# Patient Record
Sex: Female | Born: 1955 | Race: White | Hispanic: No | Marital: Married | Smoking: Current every day smoker
Health system: Southern US, Community
[De-identification: ages and names within clinical notes are randomized; demographics above are authoritative.]

## PROBLEM LIST (undated history)

## (undated) DIAGNOSIS — I10 Essential (primary) hypertension: Secondary | ICD-10-CM

## (undated) DIAGNOSIS — E7800 Pure hypercholesterolemia, unspecified: Secondary | ICD-10-CM

## (undated) DIAGNOSIS — Z720 Tobacco use: Secondary | ICD-10-CM

## (undated) DIAGNOSIS — Z87442 Personal history of urinary calculi: Secondary | ICD-10-CM

## (undated) DIAGNOSIS — F419 Anxiety disorder, unspecified: Secondary | ICD-10-CM

## (undated) HISTORY — DX: Essential (primary) hypertension: I10

## (undated) HISTORY — DX: Pure hypercholesterolemia, unspecified: E78.00

## (undated) HISTORY — DX: Anxiety disorder, unspecified: F41.9

---

## 1978-09-10 HISTORY — PX: TONSILLECTOMY AND ADENOIDECTOMY: SUR1326

## 1998-08-25 ENCOUNTER — Other Ambulatory Visit: Admission: RE | Admit: 1998-08-25 | Discharge: 1998-08-25 | Payer: Self-pay | Admitting: Obstetrics and Gynecology

## 2001-06-09 ENCOUNTER — Emergency Department (HOSPITAL_COMMUNITY): Admission: EM | Admit: 2001-06-09 | Discharge: 2001-06-09 | Payer: Self-pay | Admitting: Emergency Medicine

## 2002-08-13 ENCOUNTER — Other Ambulatory Visit: Admission: RE | Admit: 2002-08-13 | Discharge: 2002-08-13 | Payer: Self-pay | Admitting: Obstetrics and Gynecology

## 2003-11-30 ENCOUNTER — Other Ambulatory Visit: Admission: RE | Admit: 2003-11-30 | Discharge: 2003-11-30 | Payer: Self-pay | Admitting: Obstetrics and Gynecology

## 2009-11-23 ENCOUNTER — Ambulatory Visit: Payer: Self-pay | Admitting: Internal Medicine

## 2009-12-15 ENCOUNTER — Ambulatory Visit: Payer: Self-pay | Admitting: General Practice

## 2009-12-21 ENCOUNTER — Ambulatory Visit: Payer: Self-pay | Admitting: Internal Medicine

## 2010-10-10 NOTE — Miscellaneous (Signed)
Summary: Orders Update pft charges  Clinical Lists Changes  Orders: Added new Service order of Carbon Monoxide diffusing w/capacity (94720) - Signed Added new Service order of Lung Volumes (94240) - Signed Added new Service order of Spirometry (Pre & Post) (94060) - Signed 

## 2010-10-10 NOTE — Assessment & Plan Note (Signed)
Summary: Pulmonary/ ? copd vs all ace effects?   Visit Type:  Initial Consult Copy to:  [facility] Primary Provider/Referring Provider:  Dr. Burnett Sheng   CC:  COPD.  History of Present Illness: 55 yowf started smoking at age 55 on ace with chronic cough on intial pulmonary eval November 23, 2009   November 23, 2009 cc cough in am's chronically x sev years with change in  pattern > now coughing so hard takes breath away for several weeks rx with augmentin then avelox with mucus turned yellow to white but still coughing and requiing tussionex with tickle in throat always present but not disturbing sleep or excacerbating in ams.  minimal doe.  Pt denies any significant sore throat, dysphagia, itching, sneezing,  nasal congestion or excess secretions,  fever, chills, sweats, unintended wt loss, pleuritic or exertional cp, hempoptysis, change in activity tolerance  orthopnea pnd or leg swelling Pt also denies any obvious fluctuation in symptoms with weather or environmental change or other alleviating or aggravating factors.        Current Medications (verified): 1)  Celexa 20 Mg Tabs (Citalopram Hydrobromide) .Marland Kitchen.. 1 Once Daily 2)  Lisinopril-Hydrochlorothiazide 20-12.5 Mg Tabs (Lisinopril-Hydrochlorothiazide) .Marland Kitchen.. 1 Once Daily 3)  Tussionex Pennkinetic Er 8-10 Mg/56ml Lqcr (Chlorpheniramine-Hydrocodone) .Marland Kitchen.. 1 Tsp Every 12 Hours As Needed  Allergies (verified): 1)  ! Erythromycin 2)  ! Codeine  Past History:  Past Medical History: Hypertension    - ACE d/c November 23, 2009 for cough  Past Surgical History: Tonsillectomy at age 55  Family History: Emphysema- Mother (was a smoker)  Social History: Married with Agricultural consultant Current smoker since age 4.  Smokes 1 ppd. No ETOH  Review of Systems       The patient complains of productive cough, anxiety, and depression.  The patient denies shortness of breath with activity, shortness of breath at rest, non-productive cough,  coughing up blood, chest pain, irregular heartbeats, acid heartburn, indigestion, loss of appetite, weight change, abdominal pain, difficulty swallowing, sore throat, tooth/dental problems, headaches, nasal congestion/difficulty breathing through nose, sneezing, itching, ear ache, hand/feet swelling, joint stiffness or pain, rash, change in color of mucus, and fever.    Vital Signs:  Patient profile:   55 year old female Height:      62 inches Weight:      124 pounds BMI:     22.76 O2 Sat:      97 % on Room air Temp:     98.2 degrees F oral Pulse rate:   82 / minute BP sitting:   138 / 80  (left arm)  Vitals Entered By: Vernie Murders (November 23, 2009 11:34 AM)  O2 Flow:  Room air  Physical Exam  Additional Exam:  hoarse anxious wf nad HEENT: nl dentition, turbinates, and orophanx. Nl external ear canals without cough reflex NECK :  without JVD/Nodes/TM/ nl carotid upstrokes bilaterally LUNGS: no acc muscle use, clear to A and P bilaterally without cough on insp or exp maneuvers CV:  RRR  no s3 or murmur or increase in P2, no edema   ABD:  soft and nontender with nl excursion in the supine position. No bruits or organomegaly, bowel sounds nl MS:  warm without deformities, calf tenderness, cyanosis or clubbing SKIN: warm and dry without lesions   NEURO:  alert, approp, no deficits     CXR  Procedure date:  11/23/2009  Findings:      CHEST - 2 VIEW 11/23/2009:   Comparison: None.  Findings: Cardiomediastinal silhouette unremarkable.  Mildly prominent bronchovascular markings diffusely and moderate central peribronchial thickening.  Lungs otherwise clear.  No pleural effusions.  Early minimal degenerative changes in the thoracic spine.   IMPRESSION: Mild changes of bronchitis and/or asthma, either acute or chronic, without localized airspace pneumonia.    Impression & Recommendations:  Problem # 1:  COUGH (ICD-786.2) The most common causes of chronic cough in  immunocompetent adults include: upper airway cough syndrome (UACS), previously referred to as postnasal drip syndrome,  caused by variety of rhinosinus conditions; (2) asthma; (3) GERD; (4) chronic bronchitis from cigarette smoking or other inhaled environmental irritants; (5) nonasthmatic eosinophilic bronchitis; and (6) bronchiectasis. These conditions, singly or in combination, have accounted for up to 94% of the causes of chronic cough in prospective studies.  Upper airway cough syndrome, so named because it's frequently impossible to sort out how much is LPR vs CR/sinusitis with freq throat clearing generating secondary extra esophageal GERD from wide swings in gastric pressure that occur with throat clearing, promoting self use of mint and menthol lozenges that reduce the lower esophageal sphincter tone and exacerbate the problem further.  These symptoms are easily confused with asthma/copd by even experienced pulmonogists because they overlap so much. These are the same pts who not infrequently have failed to tolerate ace inhibitors,  dry powder inhalers or biphosphonates or report having reflux symptoms that don't respond to standard doses of PPI   For now try off ace, f/u pft's  Problem # 2:  HYPERTENSION (ICD-401.9)  The following medications were removed from the medication list:    Lisinopril-hydrochlorothiazide 20-12.5 Mg Tabs (Lisinopril-hydrochlorothiazide) .Marland Kitchen... 1 once daily Her updated medication list for this problem includes:    Benicar Hct 20-12.5 Mg Tabs (Olmesartan medoxomil-hctz) ..... One tablet by mouth daily   ACE inhibitors are problematic in  pts with airway complaints because  even experienced pulmononlogists can't always distinguish ace effects from copd/asthma.  By themselves they don't actually cause a problem, much like oxygen can't by itself start a fire, but they certainly serve as a powerful catalyst or enhancer for any "fire"  or inflammatory process in the upper  airway, be it caused by an ET  tube or more commonly reflux (especially in the obese or pts with known GERD or who are on biphoshonates).  In the era of ARB near equivalency until we have a better handle on the reversibility of the airway problem, it just makes sense to avoid ace entirely in the short run and then decide later, having established a level of airway control using a reasonable limited regimen, whether to add back ace but even then being very careful to observe the pt for worsening airway control and number of meds used/ needed to control symptoms.    Orders: Consultation Level V [phone])  Problem # 3:  SMOKER (ICD-305.1)  I took this opportunity to educate the patient regarding the consequences of smoking in airway disorders based on all the data we have from the multiple national lung health studies indicating that smoking cessation, not choice of inhalers or physicians, is the most important aspect of care.    Discussed but not ready to committ to quit at this point - emphasized risks involved in continuing smoking and that patient should consider these in the context of the cost of smoking relative to the benefit obtained.   Medications Added to Medication List This Visit: 1)  Celexa 20 Mg Tabs (Citalopram hydrobromide) .Marland Kitchen.. 1 once daily 2)  Lisinopril-hydrochlorothiazide 20-12.5 Mg Tabs (Lisinopril-hydrochlorothiazide) .Marland Kitchen.. 1 once daily 3)  Tussionex Pennkinetic Er 8-10 Mg/76ml Lqcr (Chlorpheniramine-hydrocodone) .Marland Kitchen.. 1 tsp every 12 hours as needed 4)  Benicar Hct 20-12.5 Mg Tabs (Olmesartan medoxomil-hctz) .... One tablet by mouth daily  Other Orders: T-2 View CXR (71020TC)  Patient Instructions: 1)  stop lisinopril 2)  start benicar 20 / 12.5 one daily  3)  GERD (REFLUX)  is a common cause of respiratory symptoms. It commonly presents without heartburn and can be treated with medication, but also with lifestyle changes including avoidance of late meals, excessive alcohol,  smoking cessation, and avoid fatty foods, chocolate, peppermint, colas, red wine, and acidic juices such as orange juice. NO MINT OR MENTHOL PRODUCTS SO NO COUGH DROPS  4)  USE SUGARLESS CANDY INSTEAD (jolley ranchers)  5)  NO OIL BASED VITAMINS  6)  Cut down on cigarettes and work committing to quit 7)  Please schedule a follow-up appointment in 4- 6 weeks with PFT's

## 2010-10-10 NOTE — Assessment & Plan Note (Signed)
Summary: Pulmonary/ ext summary f/u ov   Copy to:  [facility] Primary Provider/Referring Provider:  Dr. Burnett Sheng   CC:  4 wk followup with PFT's.  Pt states that her cough is about 50-60 % improved.  She states that her breathing has also improved.  No complaints today.  Still smoking 1 ppd..  History of Present Illness: 78 yowf started smoking at age 55 on ace with chronic cough on intial pulmonary eval November 23, 2009   November 23, 2009 cc cough in am's chronically x sev years with change in  pattern > now coughing so hard takes breath away for several weeks rx with augmentin then avelox with mucus turned yellow to white but still coughing and requiing tussionex with tickle in throat always present but not disturbing sleep or excacerbating in ams.  minimal doe.  rec:  stop lisinopril start benicar 20 / 12.5 one daily   December 21, 2009 4 wk followup with PFT's.  Pt states that her cough is about 50-60 % improved.  She states that her breathing has also improved.  No complaints today.  Still smoking 1 ppd. no purulent sputum or sleep disruption.  Pt denies any significant sore throat, dysphagia, itching, sneezing,  nasal congestion or excess secretions,  fever, chills, sweats, unintended wt loss, pleuritic or exertional cp, hempoptysis, change in activity tolerance  orthopnea pnd or leg swelling   % Current Medications (verified): 1)  Celexa 20 Mg Tabs (Citalopram Hydrobromide) .Marland Kitchen.. 1 Once Daily 2)  Tussionex Pennkinetic Er 8-10 Mg/69ml Lqcr (Chlorpheniramine-Hydrocodone) .Marland Kitchen.. 1 Tsp Every 12 Hours As Needed 3)  Benicar Hct 20-12.5 Mg  Tabs (Olmesartan Medoxomil-Hctz) .... One Tablet By Mouth Daily 4)  Valacyclovir Hcl 1 Gm Tabs (Valacyclovir Hcl) .Marland Kitchen.. 1 Two Times A Day  Allergies (verified): 1)  ! Erythromycin 2)  ! Codeine  Past History:  Past Medical History: Hypertension    - ACE d/c November 23, 2009 for cough > much improved COPD    - PFT's December 21, 2009 FEV1 1.98 (87%) ratio 73 with  Midflows 49% and classic convex FV, DLC0 89%  Vital Signs:  Patient profile:   55 year old female Height:      62 inches Weight:      147 pounds BMI:     26.98 O2 Sat:      95 % on Room air Temp:     98.1 degrees F oral Pulse rate:   81 / minute BP sitting:   124 / 70  (left arm)  Vitals Entered By: Vernie Murders (December 21, 2009 11:53 AM)  O2 Flow:  Room air  Physical Exam  Additional Exam:  amb  wf nad but affect unusual. HEENT: nl dentition, turbinates, and orophanx. Nl external ear canals without cough reflex NECK :  without JVD/Nodes/TM/ nl carotid upstrokes bilaterally LUNGS: no acc muscle use, clear to A and P bilaterally without cough on insp or exp maneuvers CV:  RRR  no s3 or murmur or increase in P2, no edema   ABD:  soft and nontender with nl excursion in the supine position. No bruits or organomegaly, bowel sounds nl MS:  warm without deformities, calf tenderness, cyanosis or clubbing    Impression & Recommendations:  Problem # 1:  COUGH (ICD-786.2) Typical of Upper airway cough syndrome, so named because it's frequently impossible to sort out how much is LPR vs CR/sinusitis with freq throat clearing generating secondary extra esophageal GERD from wide swings in gastric pressure that occur  with throat clearing, promoting self use of mint and menthol lozenges that reduce the lower esophageal sphincter tone and exacerbate the problem further.  These symptoms are easily confused with asthma/copd by even experienced pulmonogists because they overlap so much. These are the same pts who not infrequently have failed to tolerate ace inhibitors,  dry powder inhalers or biphosphonates or report having reflux symptoms that don't respond to standard doses of PPI  Should not use ace in this setting.  Needs to observe diet and add ppi next if cough not resolved to her satisfaction  Problem # 2:  HYPERTENSION (ICD-401.9)  Her updated medication list for this problem includes:     Benicar Hct 20-12.5 Mg Tabs (Olmesartan medoxomil-hctz) ..... One tablet by mouth daily  Orders: Prescription Created Electronically [phone])   ACE inhibitors are problematic in  pts with airway complaints because  even experienced pulmonologists can't always distinguish ace effects from copd/asthma.  By themselves they don't actually cause a problem, much like oxygen can't by itself start a fire, but they certainly serve as a powerful catalyst or enhancer for any "fire"  or inflammatory process in the upper airway, be it caused by an ET  tube or more commonly reflux (especially in the obese or pts with known GERD or who are on biphoshonates).  In the era of ARB near equivalency until we have a better handle on the reversibility of the airway problem, it just makes sense to avoid ace entirely in the short run and then decide later, having established a level of airway control using a reasonable limited regimen, whether to add back ace but even then being very careful to observe the pt for worsening airway control and number of meds used/ needed to control symptoms.    Problem # 3:  SMOKER (ICD-305.1) PFT's reviewed.   I took this opportunity to educate the patient regarding the consequences of smoking in airway disorders based on all the data we have from the multiple national lung health studies indicating that smoking cessation, not choice of inhalers or physicians, is the most important aspect of care.    See instructions for specific recommendations   Medications Added to Medication List This Visit: 1)  Valacyclovir Hcl 1 Gm Tabs (Valacyclovir hcl) .Marland Kitchen.. 1 two times a day  Other Orders: Est. Patient Level IV (60454)  Patient Instructions: 1)  Avoid ACE inhibitors if possible because the cough is easily confused with a copd cough 2)  PFT's suggest you have early COPD(no emphysema)  and the only effective  treatment is stop smoking. If you stop smoking your symptoms your improve if not resolve  and if not return here. 3)  If you continue to smoke, this condition will likely continue to worsening and neither I nor nor any other pulmonary doctor can promise to help you.  4)  best cough medication is mucinex dm. Prescriptions: BENICAR HCT 20-12.5 MG  TABS (OLMESARTAN MEDOXOMIL-HCTZ) One tablet by mouth daily  #34 x 11   Entered and Authorized by:   Nyoka Cowden MD   Signed by:   Nyoka Cowden MD on 12/21/2009   Method used:   Electronically to        [facility]       [address]       [place], [place]  [address]       Ph: [phone]       Fax: [phone]  RxID:   [id number]

## 2011-05-29 ENCOUNTER — Ambulatory Visit: Payer: Self-pay | Admitting: Family Medicine

## 2012-07-30 ENCOUNTER — Emergency Department: Payer: Self-pay | Admitting: Unknown Physician Specialty

## 2012-07-30 LAB — URINALYSIS, COMPLETE
Bilirubin,UR: NEGATIVE
Glucose,UR: NEGATIVE mg/dL (ref 0–75)
Ketone: NEGATIVE
Nitrite: NEGATIVE
Ph: 6 (ref 4.5–8.0)
Protein: NEGATIVE
RBC,UR: <1 /HPF (ref 0–5)
WBC UR: NONE SEEN /HPF (ref 0–5)

## 2012-07-30 LAB — TSH: Thyroid Stimulating Horm: 1.54 u[IU]/mL

## 2012-07-30 LAB — COMPREHENSIVE METABOLIC PANEL
Albumin: 4.1 g/dL (ref 3.4–5.0)
Alkaline Phosphatase: 102 U/L (ref 50–136)
Anion Gap: 8 (ref 7–16)
Bilirubin,Total: 0.5 mg/dL (ref 0.2–1.0)
Calcium, Total: 8.9 mg/dL (ref 8.5–10.1)
Co2: 26 mmol/L (ref 21–32)
Creatinine: 0.72 mg/dL (ref 0.60–1.30)
EGFR (Non-African Amer.): >60
Osmolality: 278 (ref 275–301)
Potassium: 3.2 mmol/L — ABNORMAL LOW (ref 3.5–5.1)
SGOT(AST): 22 U/L (ref 15–37)
SGPT (ALT): 19 U/L (ref 12–78)
Sodium: 139 mmol/L (ref 136–145)

## 2012-07-30 LAB — CBC
HCT: 45.2 % (ref 35.0–47.0)
HGB: 15.7 g/dL (ref 12.0–16.0)
RDW: 13.3 % (ref 11.5–14.5)
WBC: 9.5 10*3/uL (ref 3.6–11.0)

## 2012-07-31 ENCOUNTER — Emergency Department: Payer: Self-pay | Admitting: Emergency Medicine

## 2012-12-10 ENCOUNTER — Ambulatory Visit: Payer: Self-pay | Admitting: Internal Medicine

## 2013-03-23 ENCOUNTER — Encounter: Payer: Self-pay | Admitting: Internal Medicine

## 2013-03-23 ENCOUNTER — Ambulatory Visit (INDEPENDENT_AMBULATORY_CARE_PROVIDER_SITE_OTHER): Payer: BC Managed Care – PPO | Admitting: Internal Medicine

## 2013-03-23 VITALS — BP 130/70 | HR 59 | Temp 98.7°F | Ht 62.5 in | Wt 143.5 lb

## 2013-03-23 DIAGNOSIS — F411 Generalized anxiety disorder: Secondary | ICD-10-CM

## 2013-03-23 DIAGNOSIS — E7800 Pure hypercholesterolemia, unspecified: Secondary | ICD-10-CM

## 2013-03-23 DIAGNOSIS — I498 Other specified cardiac arrhythmias: Secondary | ICD-10-CM

## 2013-03-23 DIAGNOSIS — F419 Anxiety disorder, unspecified: Secondary | ICD-10-CM

## 2013-03-23 DIAGNOSIS — R001 Bradycardia, unspecified: Secondary | ICD-10-CM

## 2013-03-23 DIAGNOSIS — I10 Essential (primary) hypertension: Secondary | ICD-10-CM

## 2013-03-26 ENCOUNTER — Encounter: Payer: Self-pay | Admitting: Internal Medicine

## 2013-03-26 DIAGNOSIS — R001 Bradycardia, unspecified: Secondary | ICD-10-CM | POA: Insufficient documentation

## 2013-03-26 DIAGNOSIS — F419 Anxiety disorder, unspecified: Secondary | ICD-10-CM | POA: Insufficient documentation

## 2013-03-26 DIAGNOSIS — I10 Essential (primary) hypertension: Secondary | ICD-10-CM | POA: Insufficient documentation

## 2013-03-26 DIAGNOSIS — E7800 Pure hypercholesterolemia, unspecified: Secondary | ICD-10-CM | POA: Insufficient documentation

## 2013-03-26 NOTE — Assessment & Plan Note (Signed)
Blood pressure as outlined.  Will decrease the Atenolol to 1/2 tablet q day.  Continue the Losartan/HCTZ as she is doing.  Follow pressures.  See if this change helps the fatigue and sluggishness.  Get her back in soon to reassess.

## 2013-03-26 NOTE — Progress Notes (Signed)
Subjective:    Patient ID: Kim Love, female    DOB: 1956-05-12, 57 y.o.   MRN: [id number]  HPI 57 year old female with past history of hypertension and "borderline hypercholesterolemia" who comes in today to follow up on these issues as well as to establish care.  She has been followed by Dr Andi Devon (at an Urgent Care in [place]).  She also has seen Dr Valarie Cones and Dr Iran Ouch at [facility].  She has also seen Dr Judithann Sheen and Dr Burnett Sheng at [facility].   Dr Iran Ouch is following her for her blood pressure.  Is on Atenolol and Losartan/HCTZ.  She reports feeling more sluggish, especially after lunch.  Was questioning whether or not some of her medications could be contributing to these symptoms.  She has cut her ativan back to just taking it at night.  Feels stable on citalopram.  No chest pain or tightness.  Breathing stable.  No nausea or vomiting.  LMP 2006.  Up to date with gyn exams.  Sees Dr Malva Limes.     Past Medical History  Diagnosis Date  . Hypertension   . Hypercholesterolemia   . Anxiety     Outpatient Encounter Prescriptions as of 03/23/2013  Medication Sig Dispense Refill  . atenolol (TENORMIN) 50 MG tablet Take 50 mg by mouth daily.      . citalopram (CELEXA) 20 MG tablet Take 20 mg by mouth daily.      Marland Kitchen LORazepam (ATIVAN) 0.5 MG tablet Take 0.5 mg by mouth daily.      Marland Kitchen losartan-hydrochlorothiazide (HYZAAR) 100-25 MG per tablet Take 1 tablet by mouth daily.       No facility-administered encounter medications on file as of 03/23/2013.    Review of Systems Patient denies any headache, lightheadedness or dizziness.  No sinus or allergy symptoms.  No chest pain, tightness or palpitations.  No increased shortness of breath, cough or congestion.  No nausea or vomiting.  No acid reflux.  No abdominal pain or cramping.  No bowel change, such as diarrhea, constipation, BRBPR or melana.  No urine change.   Increased fatigue as outlined.  Blood pressure varying, but overall is doing well.  (bp  ranges - 102-130s/60-80s).  She states her blood pressure is always higher in the doctor's office.       Objective:   Physical Exam Filed Vitals:   03/23/13 1410  BP: 130/70  Pulse: 59  Temp: 98.7 F (37.1 C)   Blood pressure recheck:  140-142/86, pulse 68  57 year old female in no acute distress.   HEENT:  Nares- clear.  Oropharynx - without lesions. NECK:  Supple.  Nontender.  No audible bruit.  HEART:  Appears to be regular. LUNGS:  No crackles or wheezing audible.  Respirations even and unlabored.  RADIAL PULSE:  Equal bilaterally.    BREASTS:  Performed by gyn.   ABDOMEN:  Soft, nontender.  Bowel sounds present and normal.  No audible abdominal bruit.  GU:  Performed by gyn.    EXTREMITIES:  No increased edema present.  DP pulses palpable and equal bilaterally.          Assessment & Plan:  FATIGUE.  Check cbc, met c and tsh.  Will adjust the atenolol dosing as outlined.    HEALTH MAINTENANCE.  Up to date with breast, pelvic and pap smears.  States up to date with her mammograms.  Sees Dr Malva Limes.  Obtain records.  Due follow up 9-10/14.  Colonoscopy 2012.

## 2013-03-26 NOTE — Assessment & Plan Note (Signed)
Low cholesterol diet.  Check lipid panel.    

## 2013-03-26 NOTE — Assessment & Plan Note (Signed)
Will decrease the Atenolol as outlined.  Follow pressures and pulse.  Hopefully this will improve the sluggish feeling.  Follow closely.

## 2013-03-26 NOTE — Assessment & Plan Note (Signed)
On citalopram and ativan.  Doing better.  Stable.  Follow.

## 2013-03-31 ENCOUNTER — Encounter: Payer: Self-pay | Admitting: *Deleted

## 2013-05-06 ENCOUNTER — Other Ambulatory Visit: Payer: Self-pay | Admitting: *Deleted

## 2013-05-06 ENCOUNTER — Encounter: Payer: Self-pay | Admitting: *Deleted

## 2013-05-06 MED ORDER — LORAZEPAM 0.5 MG PO TABS: 0.5000 mg | ORAL_TABLET | Freq: Every day | ORAL | Status: DC

## 2013-05-06 NOTE — Telephone Encounter (Signed)
ok'd refill lorazepam .5mg  q day prn - #30 with no refills.

## 2013-05-14 ENCOUNTER — Encounter: Payer: Self-pay | Admitting: Internal Medicine

## 2013-05-14 ENCOUNTER — Ambulatory Visit (INDEPENDENT_AMBULATORY_CARE_PROVIDER_SITE_OTHER): Payer: BC Managed Care – PPO | Admitting: Internal Medicine

## 2013-05-14 VITALS — BP 122/80 | HR 77 | Temp 98.5°F | Ht 62.5 in | Wt 144.2 lb

## 2013-05-14 DIAGNOSIS — F411 Generalized anxiety disorder: Secondary | ICD-10-CM

## 2013-05-14 DIAGNOSIS — I498 Other specified cardiac arrhythmias: Secondary | ICD-10-CM

## 2013-05-14 DIAGNOSIS — F419 Anxiety disorder, unspecified: Secondary | ICD-10-CM

## 2013-05-14 DIAGNOSIS — R001 Bradycardia, unspecified: Secondary | ICD-10-CM

## 2013-05-14 DIAGNOSIS — I10 Essential (primary) hypertension: Secondary | ICD-10-CM

## 2013-05-14 DIAGNOSIS — E7800 Pure hypercholesterolemia, unspecified: Secondary | ICD-10-CM

## 2013-05-15 ENCOUNTER — Telehealth: Payer: Self-pay | Admitting: Internal Medicine

## 2013-05-15 MED ORDER — HYDROCHLOROTHIAZIDE 25 MG PO TABS: ORAL_TABLET | ORAL | Status: DC

## 2013-05-15 MED ORDER — LOSARTAN POTASSIUM 100 MG PO TABS: 100.0000 mg | ORAL_TABLET | Freq: Every day | ORAL | Status: DC

## 2013-05-15 NOTE — Telephone Encounter (Signed)
Refilled losartan 100mg  #90 with one refill and hctz 25mg  1/2 tablet q day #90 with one refill

## 2013-05-17 ENCOUNTER — Encounter: Payer: Self-pay | Admitting: Internal Medicine

## 2013-05-17 DIAGNOSIS — E559 Vitamin D deficiency, unspecified: Secondary | ICD-10-CM | POA: Insufficient documentation

## 2013-05-17 NOTE — Assessment & Plan Note (Signed)
On citalopram and ativan.  Doing better.  Stable.  Follow.  

## 2013-05-17 NOTE — Assessment & Plan Note (Signed)
Off atenolol.  Better.   

## 2013-05-17 NOTE — Assessment & Plan Note (Signed)
Low cholesterol diet.  Follow lipid panel.    

## 2013-05-17 NOTE — Progress Notes (Signed)
  Subjective:    Patient ID: Kim Love, female    DOB: 1956/08/25, 57 y.o.   MRN: [id number]  HPI 57 year old female with past history of hypertension and "borderline hypercholesterolemia" who comes in today for a scheduled follow up.  Dr Iran Ouch is following her for her blood pressure.  She was on Atenolol and Losartan/HCTZ.  Last visit we decreased the atenolol.  Dr Iran Ouch stopped the atenolol. Blood pressure still low. She still feels tired around 11:00 everyday.  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   Feels stable on citalopram.  No chest pain or tightness.  Breathing stable.  No nausea or vomiting.  LMP 2006.  Up to date with gyn exams.  Sees Dr Malva Limes.     Past Medical History  Diagnosis Date  . Hypertension   . Hypercholesterolemia   . Anxiety     Outpatient Encounter Prescriptions as of 05/14/2013  Medication Sig Dispense Refill  . citalopram (CELEXA) 20 MG tablet Take 20 mg by mouth daily.      Marland Kitchen LORazepam (ATIVAN) 0.5 MG tablet Take 1 tablet (0.5 mg total) by mouth daily.  30 tablet  0  . [DISCONTINUED] atenolol (TENORMIN) 50 MG tablet Take 50 mg by mouth daily.      . [DISCONTINUED] losartan-hydrochlorothiazide (HYZAAR) 100-25 MG per tablet Take 1 tablet by mouth daily.       No facility-administered encounter medications on file as of 05/14/2013.    Review of Systems Patient denies any headache, lightheadedness or dizziness.  No sinus or allergy symptoms.  No chest pain, tightness or palpitations.  No increased shortness of breath, cough or congestion.  No nausea or vomiting.  No acid reflux.  No abdominal pain or cramping.  No bowel change, such as diarrhea, constipation, BRBPR or melana.  No urine change.   Increased fatigue as outlined.  Blood pressure still a little low.  See her attached list for details.       Objective:   Physical Exam  Filed Vitals:   05/14/13 1120  BP: 122/80  Pulse: 77  Temp: 98.5 F (36.9 C)   Blood pressure recheck:   74/82  57 year old female in no acute distress.   HEENT:  Nares- clear.  Oropharynx - without lesions. NECK:  Supple.  Nontender.  No audible bruit.  HEART:  Appears to be regular. LUNGS:  No crackles or wheezing audible.  Respirations even and unlabored.  RADIAL PULSE:  Equal bilaterally.    ABDOMEN:  Soft, nontender.  Bowel sounds present and normal.  No audible abdominal bruit.    EXTREMITIES:  No increased edema present.  DP pulses palpable and equal bilaterally.          Assessment & Plan:  HEALTH MAINTENANCE.  Up to date with breast, pelvic and pap smears.  States up to date with her mammograms.  Sees Dr Malva Limes.  Obtain records.  Due follow up 9-10/14.  Colonoscopy 2012.

## 2013-05-17 NOTE — Assessment & Plan Note (Signed)
Blood pressure as outlined.  Off Atenolol.  Will change the Losartan/HCTZ 100/25 to losartan 100mg  q day and hctz 25mg  1/2 tablet q day.  Follow pressures.  See if this change helps the fatigue and sluggishness.  Get her back in soon to reassess.  Pt comfortable with this plan.

## 2013-05-21 ENCOUNTER — Telehealth: Payer: Self-pay | Admitting: Internal Medicine

## 2013-05-21 NOTE — Telephone Encounter (Signed)
The patient is having problems with her blood pressure medication. Her blood pressure is still high . I tried sending her to the triage nurse ,but she called back

## 2013-05-21 NOTE — Telephone Encounter (Signed)
Called pt, voicemail not set up, unable to leave message.

## 2013-05-21 NOTE — Telephone Encounter (Signed)
Tried to reach patient-voicemail has not been set up

## 2013-05-21 NOTE — Telephone Encounter (Signed)
Left message for pt to return my call at office number.

## 2013-05-21 NOTE — Telephone Encounter (Signed)
Had a dull Headache yesterday. Got up this AM & BP was 165/96, an hour later it was 177/105. Took her old BP meds (Losartan-HCTZ 25mg ) last BP was 132/87 & 149/89. No headache today

## 2013-05-21 NOTE — Telephone Encounter (Signed)
Continue on the losartan/hctz 100/25 q day for now.  Follow pressures.  If we need, I may adjust where she is taking 100/12.5.  Will follow pressures.  Have her call in over the next week.  If acute change or problems, let me know.

## 2013-05-21 NOTE — Telephone Encounter (Signed)
Need more information.  Need to know what her blood pressure is running.  Any other symptoms? Any acute problems - eval today.

## 2013-05-22 ENCOUNTER — Ambulatory Visit: Payer: Self-pay | Admitting: Adult Health

## 2013-05-22 NOTE — Telephone Encounter (Signed)
Pt notified of Dr. Roby Lofts recommendations- If sx's worsen over the weekend, advised to go to Acute Care or ER. Pt also advised to take second Valium if needed until BP gets back under control.

## 2013-05-26 ENCOUNTER — Telehealth: Payer: Self-pay | Admitting: *Deleted

## 2013-05-26 NOTE — Telephone Encounter (Signed)
Pharmacy Note:  Pt wants a 90 day supply  Losartan/Hctz 100-25 mg   #30  Take one tablet each day

## 2013-05-28 ENCOUNTER — Telehealth: Payer: Self-pay | Admitting: *Deleted

## 2013-05-28 ENCOUNTER — Other Ambulatory Visit: Payer: Self-pay | Admitting: *Deleted

## 2013-05-28 MED ORDER — LOSARTAN POTASSIUM-HCTZ 100-25 MG PO TABS: 1.0000 | ORAL_TABLET | Freq: Every day | ORAL | Status: DC

## 2013-05-28 NOTE — Telephone Encounter (Signed)
Updated med list

## 2013-05-28 NOTE — Telephone Encounter (Signed)
Rx sent through [facility] per request from 9/16

## 2013-06-17 ENCOUNTER — Encounter: Payer: Self-pay | Admitting: *Deleted

## 2013-06-18 ENCOUNTER — Encounter: Payer: Self-pay | Admitting: Internal Medicine

## 2013-06-18 ENCOUNTER — Encounter (INDEPENDENT_AMBULATORY_CARE_PROVIDER_SITE_OTHER): Payer: Self-pay

## 2013-06-18 ENCOUNTER — Ambulatory Visit (INDEPENDENT_AMBULATORY_CARE_PROVIDER_SITE_OTHER): Payer: BC Managed Care – PPO | Admitting: Internal Medicine

## 2013-06-18 VITALS — BP 110/80 | HR 84 | Temp 98.4°F | Ht 62.5 in | Wt 144.5 lb

## 2013-06-18 DIAGNOSIS — E7800 Pure hypercholesterolemia, unspecified: Secondary | ICD-10-CM

## 2013-06-18 DIAGNOSIS — F419 Anxiety disorder, unspecified: Secondary | ICD-10-CM

## 2013-06-18 DIAGNOSIS — R001 Bradycardia, unspecified: Secondary | ICD-10-CM

## 2013-06-18 DIAGNOSIS — I498 Other specified cardiac arrhythmias: Secondary | ICD-10-CM

## 2013-06-18 DIAGNOSIS — E559 Vitamin D deficiency, unspecified: Secondary | ICD-10-CM

## 2013-06-18 DIAGNOSIS — I10 Essential (primary) hypertension: Secondary | ICD-10-CM

## 2013-06-18 DIAGNOSIS — F411 Generalized anxiety disorder: Secondary | ICD-10-CM

## 2013-06-18 MED ORDER — AMOXICILLIN-POT CLAVULANATE 875-125 MG PO TABS: 1.0000 | ORAL_TABLET | Freq: Two times a day (BID) | ORAL | Status: DC

## 2013-06-21 ENCOUNTER — Encounter: Payer: Self-pay | Admitting: Internal Medicine

## 2013-06-21 NOTE — Assessment & Plan Note (Signed)
Off atenolol.  Better.   

## 2013-06-21 NOTE — Progress Notes (Signed)
  Subjective:    Patient ID: Kim Love, female    DOB: 10/25/1955, 57 y.o.   MRN: [id number]  HPI 57 year old female with past history of hypertension and "borderline hypercholesterolemia" who comes in today for a scheduled follow up.  Dr Iran Ouch is following her for her blood pressure.  She was on Atenolol and Losartan/HCTZ.  Last visit we decreased the atenolol.  Dr Iran Ouch stopped the atenolol.  We had decreased the Losartan/hctz last visit, but her blood pressure increased.  She is back on losartan/hctz 100/25.  Blood pressure doing well.  Overall she feels she is doing well.  Feels stable on citalopram.  No chest pain or tightness.  Breathing stable.  No nausea or vomiting.  LMP 2006.  Up to date with gyn exams.  Sees Dr Malva Limes.     Past Medical History  Diagnosis Date  . Hypertension   . Hypercholesterolemia   . Anxiety     Outpatient Encounter Prescriptions as of 06/18/2013  Medication Sig Dispense Refill  . citalopram (CELEXA) 20 MG tablet Take 20 mg by mouth daily.      Marland Kitchen LORazepam (ATIVAN) 0.5 MG tablet Take 0.25 mg by mouth daily.      Marland Kitchen losartan-hydrochlorothiazide (HYZAAR) 100-25 MG per tablet Take 1 tablet by mouth daily.  90 tablet  3  . [DISCONTINUED] LORazepam (ATIVAN) 0.5 MG tablet Take 1 tablet (0.5 mg total) by mouth daily.  30 tablet  0  . amoxicillin-clavulanate (AUGMENTIN) 875-125 MG per tablet Take 1 tablet by mouth 2 (two) times daily.  20 tablet  0   No facility-administered encounter medications on file as of 06/18/2013.    Review of Systems Patient denies any headache, lightheadedness or dizziness.  She reports having some increased nasal congestion and pressure.  Persistent.   No chest pain, tightness or palpitations.  No increased shortness of breath, cough or chest congestion.   No nausea or vomiting.  No acid reflux.  No abdominal pain or cramping.  No bowel change, such as diarrhea, constipation, BRBPR or melana.  No urine change.  Back on  losartan/hctz 100/25.  Blood pressure doing relatively well.      Objective:   Physical Exam  Filed Vitals:   06/18/13 1519  BP: 110/80  Pulse: 84  Temp: 98.4 F (36.9 C)   Blood pressure recheck:  25/56  57 year old female in no acute distress.   HEENT:  Nares- clear except for slightly erythematous turbinates.   Oropharynx - without lesions. NECK:  Supple.  Nontender.  No audible bruit.  HEART:  Appears to be regular. LUNGS:  No crackles or wheezing audible.  Respirations even and unlabored.  RADIAL PULSE:  Equal bilaterally.    ABDOMEN:  Soft, nontender.  Bowel sounds present and normal.  No audible abdominal bruit.    EXTREMITIES:  No increased edema present.  DP pulses palpable and equal bilaterally.          Assessment & Plan:  HEALTH MAINTENANCE.  Up to date with breast, pelvic and pap smears.  States up to date with her mammograms.  Sees Dr Malva Limes.  Obtain records.  Due follow up 9-10/14.  Colonoscopy 2012.

## 2013-06-21 NOTE — Assessment & Plan Note (Signed)
Blood pressure as outlined.  Off atenolol.  On losartan/hctz 100/25.  Follow.

## 2013-06-21 NOTE — Assessment & Plan Note (Signed)
On citalopram and ativan.  Doing better.  Stable.  Follow.

## 2013-06-21 NOTE — Assessment & Plan Note (Signed)
Low cholesterol diet.  Follow lipid panel.    

## 2013-06-21 NOTE — Assessment & Plan Note (Signed)
Continue replacement.  Follow.   

## 2013-08-12 ENCOUNTER — Telehealth: Payer: Self-pay | Admitting: Internal Medicine

## 2013-08-12 MED ORDER — LORAZEPAM 0.5 MG PO TABS: 0.2500 mg | ORAL_TABLET | Freq: Every day | ORAL | Status: DC

## 2013-08-12 NOTE — Telephone Encounter (Signed)
Okay to refill? 

## 2013-08-12 NOTE — Telephone Encounter (Signed)
The patient is completley out she thought she had more refills at the pharmacy.  LORazepam (ATIVAN) 0.5 MG tablet

## 2013-08-12 NOTE — Telephone Encounter (Signed)
Refilled ativan #30 with no refills.

## 2013-08-12 NOTE — Telephone Encounter (Signed)
Refilled ativan #30 with no refills.  rx printed.

## 2013-08-12 NOTE — Telephone Encounter (Signed)
Rx faxed

## 2013-09-18 ENCOUNTER — Ambulatory Visit: Payer: Self-pay | Admitting: Internal Medicine

## 2013-09-29 ENCOUNTER — Ambulatory Visit (INDEPENDENT_AMBULATORY_CARE_PROVIDER_SITE_OTHER): Payer: BC Managed Care – PPO | Admitting: Internal Medicine

## 2013-09-29 ENCOUNTER — Encounter: Payer: Self-pay | Admitting: Internal Medicine

## 2013-09-29 ENCOUNTER — Encounter (INDEPENDENT_AMBULATORY_CARE_PROVIDER_SITE_OTHER): Payer: Self-pay

## 2013-09-29 VITALS — BP 120/80 | HR 77 | Temp 98.1°F | Ht 62.5 in | Wt 150.5 lb

## 2013-09-29 DIAGNOSIS — E7800 Pure hypercholesterolemia, unspecified: Secondary | ICD-10-CM

## 2013-09-29 DIAGNOSIS — I10 Essential (primary) hypertension: Secondary | ICD-10-CM

## 2013-09-29 DIAGNOSIS — F411 Generalized anxiety disorder: Secondary | ICD-10-CM

## 2013-09-29 DIAGNOSIS — E559 Vitamin D deficiency, unspecified: Secondary | ICD-10-CM

## 2013-09-29 DIAGNOSIS — F419 Anxiety disorder, unspecified: Secondary | ICD-10-CM

## 2013-09-29 NOTE — Progress Notes (Signed)
Pre-visit discussion using our clinic review tool. No additional management support is needed unless otherwise documented below in the visit note.  

## 2013-09-29 NOTE — Progress Notes (Signed)
  Subjective:    Patient ID: Kim Love, female    DOB: 03/21/1956, 58 y.o.   MRN: [id number]  HPI 58 year old female with past history of hypertension and "borderline hypercholesterolemia" who comes in today for a scheduled follow up.  Dr Iran OuchSvetkey is following her for her blood pressure.  She is now on amlodipine and Losartan/HCTZ.  Blood pressures doing well.  Overall she feels she is doing well.  Feels stable on citalopram.  Increased stress with caring for her neighbor.  Does not feel she needs anything more at this time.  Takes lorazepam prn.  No chest pain or tightness.  Breathing stable.  No nausea or vomiting.  LMP 2006.  Up to date with gyn exams.  Sees Dr Malva LimesMark Anderson.     Past Medical History  Diagnosis Date  . Hypertension   . Hypercholesterolemia   . Anxiety     Outpatient Encounter Prescriptions as of 09/29/2013  Medication Sig  . amLODipine (NORVASC) 2.5 MG tablet Take 2.5 mg by mouth daily.  . Cholecalciferol (VITAMIN D-3 PO) Take by mouth daily.  . citalopram (CELEXA) 20 MG tablet Take 20 mg by mouth daily.  Marland Kitchen. ELDERBERRY PO Take by mouth daily.  Marland Kitchen. LORazepam (ATIVAN) 0.5 MG tablet Take 0.5 tablets (0.25 mg total) by mouth daily.  Marland Kitchen. losartan-hydrochlorothiazide (HYZAAR) 100-25 MG per tablet Take 1 tablet by mouth daily.  . [DISCONTINUED] amoxicillin-clavulanate (AUGMENTIN) 875-125 MG per tablet Take 1 tablet by mouth 2 (two) times daily.    Review of Systems Patient denies any headache, lightheadedness or dizziness.  No sinus congestion or drainage.   No chest pain, tightness or palpitations.  No increased shortness of breath, cough or chest congestion.   No nausea or vomiting.  No acid reflux.  No abdominal pain or cramping.  No bowel change, such as diarrhea, constipation, BRBPR or melana.  No urine change.  Blood pressure doing well overall.  Blood pressure averaging 117-128/80s.  Increased stress as outlined.       Objective:   Physical Exam  Filed Vitals:   09/29/13 0800  BP: 120/80  Pulse: 77  Temp: 98.1 F (36.7 C)   Blood pressure recheck:  43136/8080  58 year old female in no acute distress.   HEENT:  Nares- clear.  Oropharynx - without lesions. NECK:  Supple.  Nontender.  No audible bruit.  HEART:  Appears to be regular. LUNGS:  No crackles or wheezing audible.  Respirations even and unlabored.  RADIAL PULSE:  Equal bilaterally.    ABDOMEN:  Soft, nontender.  Bowel sounds present and normal.  No audible abdominal bruit.    EXTREMITIES:  No increased edema present.  DP pulses palpable and equal bilaterally.          Assessment & Plan:  HEALTH MAINTENANCE.  Up to date with breast, pelvic and pap smears.  States up to date with her mammograms.  Sees Dr Malva LimesMark Anderson.  [facility]   Colonoscopy 2012.    I spent 25 minutes with the patient and more than 50% of the time was spent in consultation regarding the above.

## 2013-10-01 ENCOUNTER — Encounter: Payer: Self-pay | Admitting: Internal Medicine

## 2013-10-01 NOTE — Assessment & Plan Note (Signed)
On citalopram and ativan.  Increased stress.  Feels she is handling things relatively well.  Does not feels she needs anything more at this time.  Follow.    

## 2013-10-01 NOTE — Assessment & Plan Note (Signed)
Low cholesterol diet.  Follow lipid panel.    

## 2013-10-01 NOTE — Assessment & Plan Note (Signed)
Blood pressure as outlined.  Off atenolol.  On losartan/hctz 100/25 and amlodipine.  Blood pressure is doing well.  Follow.    

## 2013-10-01 NOTE — Assessment & Plan Note (Signed)
Continue replacement.  Follow.   

## 2013-10-14 ENCOUNTER — Other Ambulatory Visit: Payer: Self-pay | Admitting: Internal Medicine

## 2013-10-14 NOTE — Telephone Encounter (Signed)
Refilled lorazepam #30 with no refills.   

## 2013-10-14 NOTE — Telephone Encounter (Signed)
Ok refill? 

## 2013-10-15 MED ORDER — LORAZEPAM 0.5 MG PO TABS: ORAL_TABLET | ORAL | Status: DC

## 2013-10-15 NOTE — Telephone Encounter (Signed)
Rx faxed to pharmacy  

## 2013-11-02 ENCOUNTER — Other Ambulatory Visit: Payer: Self-pay | Admitting: *Deleted

## 2013-11-03 MED ORDER — CITALOPRAM HYDROBROMIDE 20 MG PO TABS: 20.0000 mg | ORAL_TABLET | Freq: Every day | ORAL | Status: DC

## 2013-11-09 ENCOUNTER — Other Ambulatory Visit: Payer: Self-pay | Admitting: Internal Medicine

## 2013-11-09 NOTE — Telephone Encounter (Signed)
Refill

## 2013-11-09 NOTE — Telephone Encounter (Signed)
Pt states that she still has a month worth left. The pharmacy requested that refill along with her other meds. Do not need the Ativan at this time.

## 2013-11-09 NOTE — Telephone Encounter (Signed)
Noted  

## 2013-11-09 NOTE — Telephone Encounter (Signed)
I am ok to refill x 1.  Need to clarify how she is taking the medication.  Thanks.  Per records - should not be out.

## 2013-12-14 ENCOUNTER — Other Ambulatory Visit: Payer: Self-pay | Admitting: Internal Medicine

## 2013-12-14 NOTE — Telephone Encounter (Signed)
Refilled lorazepam #30 with no refills.   

## 2013-12-14 NOTE — Telephone Encounter (Signed)
Okay to refill? Last seen on 09/29/13

## 2014-01-28 ENCOUNTER — Ambulatory Visit (INDEPENDENT_AMBULATORY_CARE_PROVIDER_SITE_OTHER): Payer: Self-pay | Admitting: Internal Medicine

## 2014-01-28 ENCOUNTER — Ambulatory Visit: Payer: Self-pay | Admitting: Internal Medicine

## 2014-01-28 ENCOUNTER — Encounter: Payer: Self-pay | Admitting: Internal Medicine

## 2014-01-28 VITALS — BP 120/70 | HR 71 | Temp 98.1°F | Ht 62.5 in | Wt 149.0 lb

## 2014-01-28 DIAGNOSIS — R5383 Other fatigue: Secondary | ICD-10-CM

## 2014-01-28 DIAGNOSIS — F411 Generalized anxiety disorder: Secondary | ICD-10-CM

## 2014-01-28 DIAGNOSIS — I498 Other specified cardiac arrhythmias: Secondary | ICD-10-CM

## 2014-01-28 DIAGNOSIS — E559 Vitamin D deficiency, unspecified: Secondary | ICD-10-CM

## 2014-01-28 DIAGNOSIS — E7800 Pure hypercholesterolemia, unspecified: Secondary | ICD-10-CM

## 2014-01-28 DIAGNOSIS — R001 Bradycardia, unspecified: Secondary | ICD-10-CM

## 2014-01-28 DIAGNOSIS — R5381 Other malaise: Secondary | ICD-10-CM

## 2014-01-28 DIAGNOSIS — I10 Essential (primary) hypertension: Secondary | ICD-10-CM

## 2014-01-28 DIAGNOSIS — F419 Anxiety disorder, unspecified: Secondary | ICD-10-CM

## 2014-01-28 NOTE — Progress Notes (Signed)
  Subjective:    Patient ID: Kim Love, female    DOB: 1955/11/05, 58 y.o.   MRN: [id number]  HPI 58 year old female with past history of hypertension and "borderline hypercholesterolemia" who comes in today for a scheduled follow up.  Dr Chari Manning is following her for her blood pressure.  She is now on amlodipine and Losartan/HCTZ.  Blood pressures doing well.  Overall she feels she is doing well.  Feels stable on citalopram.  Increased stress with caring for her neighbor.  Does not feel she needs anything more at this time.  Takes lorazepam prn.  No chest pain or tightness.  Breathing stable.  No nausea or vomiting.  LMP 2006.  Up to date with gyn exams.  Sees Dr Freda Munro.     Past Medical History  Diagnosis Date  . Hypertension   . Hypercholesterolemia   . Anxiety     Outpatient Encounter Prescriptions as of 01/28/2014  Medication Sig  . amLODipine (NORVASC) 2.5 MG tablet Take 2.5 mg by mouth daily.  . citalopram (CELEXA) 20 MG tablet Take 1 tablet (20 mg total) by mouth daily.  Marland Kitchen LORazepam (ATIVAN) 0.5 MG tablet TAKE 1/2 TABLET BY MOUTH ONCE DAILY  . losartan-hydrochlorothiazide (HYZAAR) 100-25 MG per tablet Take 1 tablet by mouth daily.  . [DISCONTINUED] Cholecalciferol (VITAMIN D-3 PO) Take by mouth daily.  . [DISCONTINUED] ELDERBERRY PO Take by mouth daily.    Review of Systems Patient denies any headache, lightheadedness or dizziness.  No sinus congestion or drainage.   No chest pain, tightness or palpitations.  No increased shortness of breath, cough or chest congestion.   No nausea or vomiting.  No acid reflux.  No abdominal pain or cramping.  No bowel change, such as diarrhea, constipation, BRBPR or melana.  No urine change.  Blood pressure doing well overall.   Increased stress as outlined.  Overall feels she is handling things relatively well.       Objective:   Physical Exam  Filed Vitals:   01/28/14 1608  BP: 120/70  Pulse: 71  Temp: 98.1 F (36.7 C)   Blood  pressure recheck:  46/70  58 year old female in no acute distress.   HEENT:  Nares- clear.  Oropharynx - without lesions. NECK:  Supple.  Nontender.  No audible bruit.  HEART:  Appears to be regular. LUNGS:  No crackles or wheezing audible.  Respirations even and unlabored.  RADIAL PULSE:  Equal bilaterally.    ABDOMEN:  Soft, nontender.  Bowel sounds present and normal.  No audible abdominal bruit.    EXTREMITIES:  No increased edema present.  DP pulses palpable and equal bilaterally.          Assessment & Plan:  FATIGUE.  Some fatigue with increased stress.  Overall doing relatively well.  Check cbc, met c and tsh.    HEALTH MAINTENANCE.  Up to date with breast, pelvic and pap smears.  States up to date with her mammograms.  Sees Dr Freda Munro.  Had her mammogram at [facility].   Colonoscopy 2012.

## 2014-01-28 NOTE — Progress Notes (Signed)
Pre visit review using our clinic review tool, if applicable. No additional management support is needed unless otherwise documented below in the visit note. 

## 2014-02-01 ENCOUNTER — Encounter: Payer: Self-pay | Admitting: Internal Medicine

## 2014-02-01 NOTE — Assessment & Plan Note (Signed)
Blood pressure as outlined.  Off atenolol.  On losartan/hctz 100/25 and amlodipine.  Blood pressure is doing well.  Follow.

## 2014-02-01 NOTE — Assessment & Plan Note (Signed)
Low cholesterol diet.  Follow lipid panel.    

## 2014-02-01 NOTE — Assessment & Plan Note (Signed)
Continue replacement.  Follow.

## 2014-02-01 NOTE — Assessment & Plan Note (Signed)
On citalopram and ativan.  Increased stress.  Feels she is handling things relatively well.  Does not feels she needs anything more at this time.  Follow.

## 2014-02-01 NOTE — Assessment & Plan Note (Signed)
Off atenolol.  Better.

## 2014-02-03 ENCOUNTER — Other Ambulatory Visit: Payer: Self-pay | Admitting: Internal Medicine

## 2014-02-05 ENCOUNTER — Other Ambulatory Visit: Payer: Self-pay

## 2014-02-15 ENCOUNTER — Other Ambulatory Visit (INDEPENDENT_AMBULATORY_CARE_PROVIDER_SITE_OTHER): Payer: BC Managed Care – PPO

## 2014-02-15 DIAGNOSIS — R5381 Other malaise: Secondary | ICD-10-CM

## 2014-02-15 DIAGNOSIS — R5383 Other fatigue: Secondary | ICD-10-CM

## 2014-02-15 DIAGNOSIS — I10 Essential (primary) hypertension: Secondary | ICD-10-CM

## 2014-02-15 DIAGNOSIS — E7800 Pure hypercholesterolemia, unspecified: Secondary | ICD-10-CM

## 2014-02-15 LAB — LIPID PANEL
CHOL/HDL RATIO: 6
Cholesterol: 184 mg/dL (ref 0–200)
HDL: 33 mg/dL — AB (ref >39.00)
LDL Cholesterol: 126 mg/dL — ABNORMAL HIGH (ref 0–99)
NONHDL: 151
TRIGLYCERIDES: 126 mg/dL (ref 0.0–149.0)
VLDL: 25.2 mg/dL (ref 0.0–40.0)

## 2014-02-15 LAB — CBC WITH DIFFERENTIAL/PLATELET
Basophils Absolute: 0 10*3/uL (ref 0.0–0.1)
Basophils Relative: 0.7 % (ref 0.0–3.0)
EOS ABS: 0.4 10*3/uL (ref 0.0–0.7)
Eosinophils Relative: 6.5 % — ABNORMAL HIGH (ref 0.0–5.0)
HCT: 40.6 % (ref 36.0–46.0)
Hemoglobin: 13.7 g/dL (ref 12.0–15.0)
LYMPHS PCT: 22 % (ref 12.0–46.0)
Lymphs Abs: 1.4 10*3/uL (ref 0.7–4.0)
MCHC: 33.7 g/dL (ref 30.0–36.0)
MCV: 90.6 fl (ref 78.0–100.0)
MONOS PCT: 5.2 % (ref 3.0–12.0)
Monocytes Absolute: 0.3 10*3/uL (ref 0.1–1.0)
Neutro Abs: 4.2 10*3/uL (ref 1.4–7.7)
Neutrophils Relative %: 65.6 % (ref 43.0–77.0)
PLATELETS: 279 10*3/uL (ref 150.0–400.0)
RBC: 4.48 Mil/uL (ref 3.87–5.11)
RDW: 13.8 % (ref 11.5–15.5)
WBC: 6.4 10*3/uL (ref 4.0–10.5)

## 2014-02-15 LAB — COMPREHENSIVE METABOLIC PANEL
ALT: 17 U/L (ref 0–35)
AST: 18 U/L (ref 0–37)
Albumin: 3.7 g/dL (ref 3.5–5.2)
Alkaline Phosphatase: 62 U/L (ref 39–117)
BUN: 11 mg/dL (ref 6–23)
CO2: 29 meq/L (ref 19–32)
Calcium: 8.8 mg/dL (ref 8.4–10.5)
Chloride: 103 mEq/L (ref 96–112)
Creatinine, Ser: 0.8 mg/dL (ref 0.4–1.2)
GFR: 77.53 mL/min (ref >60.00)
Glucose, Bld: 91 mg/dL (ref 70–99)
Potassium: 3.3 mEq/L — ABNORMAL LOW (ref 3.5–5.1)
SODIUM: 139 meq/L (ref 135–145)
TOTAL PROTEIN: 6.7 g/dL (ref 6.0–8.3)
Total Bilirubin: 0.3 mg/dL (ref 0.2–1.2)

## 2014-02-15 LAB — TSH: TSH: 0.97 u[IU]/mL (ref 0.35–4.50)

## 2014-02-16 ENCOUNTER — Encounter: Payer: Self-pay | Admitting: *Deleted

## 2014-02-22 ENCOUNTER — Other Ambulatory Visit: Payer: Self-pay | Admitting: *Deleted

## 2014-02-22 MED ORDER — LORAZEPAM 0.5 MG PO TABS: ORAL_TABLET | ORAL | Status: DC

## 2014-02-22 NOTE — Telephone Encounter (Signed)
Ok refill? 

## 2014-02-22 NOTE — Telephone Encounter (Signed)
Rx faxed to pharmacy  

## 2014-02-22 NOTE — Telephone Encounter (Signed)
Refilled lorazepam #30 with no refills.   

## 2014-02-27 ENCOUNTER — Other Ambulatory Visit: Payer: Self-pay | Admitting: Internal Medicine

## 2014-03-01 ENCOUNTER — Other Ambulatory Visit (INDEPENDENT_AMBULATORY_CARE_PROVIDER_SITE_OTHER): Payer: BC Managed Care – PPO

## 2014-03-01 ENCOUNTER — Telehealth: Payer: Self-pay | Admitting: *Deleted

## 2014-03-01 DIAGNOSIS — E876 Hypokalemia: Secondary | ICD-10-CM

## 2014-03-01 LAB — POTASSIUM: Potassium: 3.7 mEq/L (ref 3.5–5.1)

## 2014-03-01 NOTE — Telephone Encounter (Signed)
Order placed for potassium.   

## 2014-03-01 NOTE — Telephone Encounter (Signed)
What labs and dx?  

## 2014-03-01 NOTE — Addendum Note (Signed)
Addended by: Charm BargesSCOTT, CHARLENE S on: 03/01/2014 12:48 PM   Modules accepted: Orders

## 2014-03-02 NOTE — Progress Notes (Signed)
6/23 LMTCB/bn

## 2014-03-09 ENCOUNTER — Ambulatory Visit: Payer: Self-pay | Admitting: Adult Health

## 2014-03-26 ENCOUNTER — Other Ambulatory Visit: Payer: Self-pay | Admitting: Internal Medicine

## 2014-03-26 NOTE — Telephone Encounter (Signed)
Need to clarify how she is taking the medication.  I have down that she is taking 1/2 daily prn.  She was given #30 on 02/22/14.  If taking the way prescribed, would still have pills left.  Just need to clarify and then can see about refill.    Dr Lorin PicketScott

## 2014-03-26 NOTE — Telephone Encounter (Signed)
Spoke with pt , she verified that she is only taking 1/2 tab daily and she is not in need of a refill right now.  She was not aware pharmacy had sent refill request.

## 2014-03-26 NOTE — Telephone Encounter (Signed)
Last refill 6.15.15, last OV 5.21.15 next OV 9.21.15.  Please advise refill.

## 2014-04-14 ENCOUNTER — Ambulatory Visit: Payer: Self-pay | Admitting: Internal Medicine

## 2014-04-19 ENCOUNTER — Other Ambulatory Visit: Payer: Self-pay | Admitting: Internal Medicine

## 2014-04-19 NOTE — Telephone Encounter (Signed)
Rx faxed to [facility]

## 2014-04-19 NOTE — Telephone Encounter (Signed)
Refilled lorazepam #30 with no refills.  rx signed and placed on your desk.

## 2014-04-19 NOTE — Telephone Encounter (Signed)
Last refill 6.15.15.  Last OV 5.21.15, next OV 9.21.15.  Please advise refill.

## 2014-05-22 ENCOUNTER — Other Ambulatory Visit: Payer: Self-pay | Admitting: Internal Medicine

## 2014-05-28 ENCOUNTER — Other Ambulatory Visit: Payer: Self-pay | Admitting: Internal Medicine

## 2014-05-31 ENCOUNTER — Encounter: Payer: Self-pay | Admitting: Internal Medicine

## 2014-05-31 ENCOUNTER — Ambulatory Visit (INDEPENDENT_AMBULATORY_CARE_PROVIDER_SITE_OTHER): Payer: BC Managed Care – PPO | Admitting: Internal Medicine

## 2014-05-31 VITALS — BP 130/80 | HR 80 | Temp 98.6°F | Ht 62.5 in | Wt 145.5 lb

## 2014-05-31 DIAGNOSIS — I10 Essential (primary) hypertension: Secondary | ICD-10-CM

## 2014-05-31 DIAGNOSIS — E7800 Pure hypercholesterolemia, unspecified: Secondary | ICD-10-CM

## 2014-05-31 DIAGNOSIS — I498 Other specified cardiac arrhythmias: Secondary | ICD-10-CM

## 2014-05-31 DIAGNOSIS — Z23 Encounter for immunization: Secondary | ICD-10-CM

## 2014-05-31 DIAGNOSIS — R001 Bradycardia, unspecified: Secondary | ICD-10-CM

## 2014-05-31 DIAGNOSIS — F419 Anxiety disorder, unspecified: Secondary | ICD-10-CM

## 2014-05-31 DIAGNOSIS — F411 Generalized anxiety disorder: Secondary | ICD-10-CM

## 2014-05-31 DIAGNOSIS — E559 Vitamin D deficiency, unspecified: Secondary | ICD-10-CM

## 2014-05-31 NOTE — Progress Notes (Signed)
Pre visit review using our clinic review tool, if applicable. No additional management support is needed unless otherwise documented below in the visit note. 

## 2014-06-04 ENCOUNTER — Other Ambulatory Visit: Payer: Self-pay | Admitting: *Deleted

## 2014-06-04 MED ORDER — CITALOPRAM HYDROBROMIDE 20 MG PO TABS: ORAL_TABLET | ORAL | Status: DC

## 2014-06-06 ENCOUNTER — Encounter: Payer: Self-pay | Admitting: Internal Medicine

## 2014-06-06 NOTE — Assessment & Plan Note (Addendum)
Blood pressure as outlined.  Off atenolol.  On losartan/hctz 100/25 and amlodipine.  Follow.

## 2014-06-06 NOTE — Assessment & Plan Note (Signed)
Off atenolol.  Better.   

## 2014-06-06 NOTE — Assessment & Plan Note (Signed)
On citalopram and ativan.  Increased stress.  Feels she is handling things relatively well.  Does not feels she needs anything more at this time.  Follow.  Discussed counseling, etc.  Will notify me if she feels she needs.

## 2014-06-06 NOTE — Progress Notes (Signed)
  Subjective:    Patient ID: Kim Love, female    DOB: October 23, 1955, 58 y.o.   MRN: [id number]  HPI 58 year old female with past history of hypertension and "borderline hypercholesterolemia" who comes in today for a scheduled follow up.  Dr Chari Manning is following her for her blood pressure.  She is now on amlodipine and Losartan/HCTZ.  Blood pressures have been doing well.  Overall she feels she is doing relatively well.  On citalopram.  Increased stress with caring for her neighbor and the lady that lives with him.  Does not feel she needs anything more at this time.  Takes lorazepam prn.  No chest pain or tightness.  Breathing stable.  No nausea or vomiting.  LMP 2006.  Up to date with gyn exams.  Sees Dr Freda Munro.  Does report some fatigue.  We discussed the stress at length today.     Past Medical History  Diagnosis Date  . Hypertension   . Hypercholesterolemia   . Anxiety     Outpatient Encounter Prescriptions as of 05/31/2014  Medication Sig  . amLODipine (NORVASC) 2.5 MG tablet Take 2.5 mg by mouth daily.  Marland Kitchen LORazepam (ATIVAN) 0.5 MG tablet TAKE ONE-HALF TABLET DAILY  . losartan-hydrochlorothiazide (HYZAAR) 100-25 MG per tablet TAKE ONE (1) TABLET BY MOUTH EVERY DAY  . [DISCONTINUED] citalopram (CELEXA) 20 MG tablet TAKE ONE (1) TABLET EACH DAY    Review of Systems Patient denies any headache, lightheadedness or dizziness.  No sinus congestion or drainage.   No chest pain, tightness or palpitations.  No increased shortness of breath, cough or chest congestion.   No nausea or vomiting.  No acid reflux.  No abdominal pain or cramping.  No bowel change, such as diarrhea, constipation, BRBPR or melana.  No urine change.  Blood pressure doing well overall.   Increased stress as outlined.      Objective:   Physical Exam  Filed Vitals:   05/31/14 1626  BP: 130/80  Pulse: 80  Temp: 98.6 F (9 C)   58 year old female in no acute distress.   HEENT:  Nares- clear.  Oropharynx -  without lesions. NECK:  Supple.  Nontender.  No audible bruit.  HEART:  Appears to be regular. LUNGS:  No crackles or wheezing audible.  Respirations even and unlabored.  RADIAL PULSE:  Equal bilaterally.    ABDOMEN:  Soft, nontender.  Bowel sounds present and normal.  No audible abdominal bruit.    EXTREMITIES:  No increased edema present.  DP pulses palpable and equal bilaterally.          Assessment & Plan:  FATIGUE.  Some fatigue with increased stress.  Overall doing relatively well.  Check cbc, met c and tsh.    HEALTH MAINTENANCE.  Up to date with breast, pelvic and pap smears.  States up to date with her mammograms.  Sees Dr Freda Munro.  Had her mammogram at [facility].   Colonoscopy 2012.    I spent 25 minutes with the patient and more than 50% of the time was spent in consultation regarding the above, specifically discussing increased stress, etc.

## 2014-06-06 NOTE — Assessment & Plan Note (Signed)
Low cholesterol diet.  Follow lipid panel.    

## 2014-06-06 NOTE — Assessment & Plan Note (Signed)
Continue replacement.  Follow.   

## 2014-06-22 ENCOUNTER — Other Ambulatory Visit: Payer: Self-pay | Admitting: Internal Medicine

## 2014-06-22 NOTE — Telephone Encounter (Signed)
Spoke with pharmacist, pt only filled #15 on 9.18.15 instead of #30 as written.  Please advise

## 2014-06-22 NOTE — Telephone Encounter (Signed)
Please call pt and clarify how much she is taking.  Per reviewing medications, we gave her 30 on 04/19/14.  Per documentation in this note, she got a refill on 05/28/14.  If taking 1/2 q day, should not need refill yet unless this date is wrong.  Just need to clarify with pt and/or pharmacy.  Thanks.

## 2014-06-22 NOTE — Telephone Encounter (Signed)
Last refill 9.18.15, last OV 9.21.15.  Please advise refill

## 2014-06-23 NOTE — Telephone Encounter (Signed)
Rx faxed

## 2014-06-23 NOTE — Telephone Encounter (Signed)
Refilled lorazepam #15 with one refill.

## 2014-08-16 ENCOUNTER — Other Ambulatory Visit: Payer: Self-pay | Admitting: *Deleted

## 2014-08-16 ENCOUNTER — Other Ambulatory Visit: Payer: Self-pay | Admitting: Internal Medicine

## 2014-08-16 MED ORDER — LORAZEPAM 0.5 MG PO TABS: 0.2500 mg | ORAL_TABLET | Freq: Every day | ORAL | Status: DC

## 2014-08-30 ENCOUNTER — Ambulatory Visit: Payer: Self-pay | Admitting: Internal Medicine

## 2014-09-27 ENCOUNTER — Encounter: Payer: Self-pay | Admitting: Nurse Practitioner

## 2014-09-27 ENCOUNTER — Ambulatory Visit (INDEPENDENT_AMBULATORY_CARE_PROVIDER_SITE_OTHER): Payer: BLUE CROSS/BLUE SHIELD | Admitting: Nurse Practitioner

## 2014-09-27 VITALS — BP 118/70 | HR 84 | Temp 98.1°F | Resp 12 | Wt 152.4 lb

## 2014-09-27 DIAGNOSIS — J310 Chronic rhinitis: Secondary | ICD-10-CM

## 2014-09-27 DIAGNOSIS — J329 Chronic sinusitis, unspecified: Secondary | ICD-10-CM

## 2014-09-27 NOTE — Progress Notes (Signed)
Subjective:    Patient ID: Kim Love, female    DOB: 01-02-1956, 59 y.o.   MRN: [id number]  HPI  [name] is a 59 yo female with a CC of facial pain on left with ear pain and pressure.   1) 09/25/14- swollen left side, fatigued, feel pressure and drainage. Sore throat.   -No treatments to date  Review of Systems  Constitutional: Positive for fatigue. Negative for fever, chills and diaphoresis.  HENT: Positive for congestion, postnasal drip, sinus pressure and sneezing. Negative for ear discharge, ear pain, facial swelling, rhinorrhea, sore throat, tinnitus and trouble swallowing.        Night and morning  Eyes: Negative for visual disturbance.  Respiratory: Negative for chest tightness, shortness of breath and wheezing.   Cardiovascular: Negative for chest pain, palpitations and leg swelling.  Gastrointestinal: Negative for nausea, vomiting and diarrhea.  Skin: Negative for rash.  Neurological: Negative for dizziness and headaches.   Past Medical History  Diagnosis Date  . Hypertension   . Hypercholesterolemia   . Anxiety     History   Social History  . Marital Status: Married    Spouse Name: N/A    Number of Children: 1  . Years of Education: N/A   Occupational History  .  Kim [employer]   Social History Main Topics  . Smoking status: Former Smoker    Types: Cigarettes    Quit date: 09/10/2012  . Smokeless tobacco: Current User  . Alcohol Use: No  . Drug Use: No  . Sexual Activity: Not on file   Other Topics Concern  . Not on file   Social History Narrative    Past Surgical History  Procedure Laterality Date  . Tonsillectomy and adenoidectomy Bilateral 1980    Family History  Problem Relation Age of Onset  . Stroke Maternal Grandmother     Allergies  Allergen Reactions  . Codeine     REACTION: "gallbladder issues"  . Erythromycin     REACTION: nausea  . Erythromycin     Upset stomach  . Codeine     Gallbladder issues    Current  Outpatient Prescriptions on File Prior to Visit  Medication Sig Dispense Refill  . amLODipine (NORVASC) 2.5 MG tablet Take 2.5 mg by mouth daily.    . citalopram (CELEXA) 20 MG tablet TAKE ONE (1) TABLET EACH DAY 90 tablet 0  . LORazepam (ATIVAN) 0.5 MG tablet Take 0.5 tablets (0.25 mg total) by mouth daily. 30 tablet 0  . losartan-hydrochlorothiazide (HYZAAR) 100-25 MG per tablet TAKE ONE (1) TABLET BY MOUTH EVERY DAY 90 tablet 1   No current facility-administered medications on file prior to visit.       Objective:   Physical Exam  Constitutional: She is oriented to person, place, and time. She appears well-developed and well-nourished. No distress.  BP 118/70 mmHg  Pulse 84  Temp(Src) 98.1 F (36.7 C) (Oral)  Resp 12  Wt 152 lb 6.4 oz (69.128 kg)  SpO2 98%   HENT:  Head: Normocephalic and atraumatic.  Right Ear: External ear normal.  Left Ear: External ear normal.  Eyes: Conjunctivae and EOM are normal. Pupils are equal, round, and reactive to light. Right eye exhibits no discharge. Left eye exhibits no discharge. No scleral icterus.  Neck: Normal range of motion. Neck supple.  Cardiovascular: Normal rate and regular rhythm.   Pulmonary/Chest: Effort normal and breath sounds normal.  Lymphadenopathy:    She has no cervical adenopathy.  Neurological: She is alert and oriented to person, place, and time.  Skin: She is not diaphoretic.  Psychiatric: She has a normal mood and affect. Her behavior is normal. Judgment and thought content normal.      Assessment & Plan:

## 2014-09-27 NOTE — Patient Instructions (Signed)
Post Nasal Drip and allergies can be treated with benadryl,  Allegra, Zyrtec or claritin (Pick one). Benadryl is the most effective for drying you up but it is also the most sedating,  So try taking it at night and use one of the others in the daytime  Add Coricidin HBP as needed for congestion  "DM" stands for dextromethorphan which is a cough suppressant.  The best/strongest available OTC cough suppressant is Delsym.  Saline nasal spray will help wash out nose and keep sinus infection from being full blown.   Call us in 7 days if you are not improving or worsening.

## 2014-09-27 NOTE — Progress Notes (Signed)
Pre visit review using our clinic review tool, if applicable. No additional management support is needed unless otherwise documented below in the visit note. 

## 2014-09-28 NOTE — Assessment & Plan Note (Signed)
Stable. Patient does not meet guidelines for antibiotics at this time.  Will treat with decongestants, and saline lavage.  Adding steroid nasal spray if not already taking. FU prn worsening/failure to improve

## 2014-11-02 ENCOUNTER — Telehealth: Payer: Self-pay | Admitting: Internal Medicine

## 2014-11-02 ENCOUNTER — Other Ambulatory Visit: Payer: Self-pay | Admitting: Internal Medicine

## 2014-11-02 NOTE — Telephone Encounter (Signed)
Okay to refill? Last seen on 05/31/14. No future appt scheduled.

## 2014-11-03 NOTE — Telephone Encounter (Signed)
ok'd refill for lorazepam #30 with no refills.  Needs to schedule a 30 min f/u appt first available.  Thanks

## 2014-11-03 NOTE — Telephone Encounter (Signed)
appt made. Pt aware/msn

## 2014-11-03 NOTE — Telephone Encounter (Signed)
Pt needs appt, see below

## 2014-11-24 ENCOUNTER — Encounter: Payer: Self-pay | Admitting: Internal Medicine

## 2014-11-24 ENCOUNTER — Ambulatory Visit (INDEPENDENT_AMBULATORY_CARE_PROVIDER_SITE_OTHER): Payer: BLUE CROSS/BLUE SHIELD | Admitting: Internal Medicine

## 2014-11-24 VITALS — BP 115/74 | HR 84 | Temp 98.0°F | Ht 62.5 in | Wt 147.0 lb

## 2014-11-24 DIAGNOSIS — R001 Bradycardia, unspecified: Secondary | ICD-10-CM

## 2014-11-24 DIAGNOSIS — E7800 Pure hypercholesterolemia, unspecified: Secondary | ICD-10-CM

## 2014-11-24 DIAGNOSIS — E559 Vitamin D deficiency, unspecified: Secondary | ICD-10-CM

## 2014-11-24 DIAGNOSIS — F419 Anxiety disorder, unspecified: Secondary | ICD-10-CM

## 2014-11-24 DIAGNOSIS — I10 Essential (primary) hypertension: Secondary | ICD-10-CM

## 2014-11-24 DIAGNOSIS — Z0000 Encounter for general adult medical examination without abnormal findings: Secondary | ICD-10-CM

## 2014-11-24 NOTE — Progress Notes (Signed)
Pre visit review using our clinic review tool, if applicable. No additional management support is needed unless otherwise documented below in the visit note. 

## 2014-11-28 ENCOUNTER — Encounter: Payer: Self-pay | Admitting: Internal Medicine

## 2014-11-28 DIAGNOSIS — Z0000 Encounter for general adult medical examination without abnormal findings: Secondary | ICD-10-CM | POA: Insufficient documentation

## 2014-11-28 NOTE — Progress Notes (Signed)
Patient ID: Kim Love, female   DOB: 25-Apr-1956, 59 y.o.   MRN: [id number]   Subjective:    Patient ID: Kim Love, female    DOB: 09/04/1956, 59 y.o.   MRN: [id number]  HPI  Patient here for a scheduled follow up.  Doing well.  Feels she is handling stress relatively well.  We discussed lorazepam.  Does not increase the amount.  Tries to stay active.  No cardiac symptoms with increased activity or exertion.  Breathing stable.  Bowels stable.     Past Medical History  Diagnosis Date  . Hypertension   . Hypercholesterolemia   . Anxiety     Current Outpatient Prescriptions on File Prior to Visit  Medication Sig Dispense Refill  . amLODipine (NORVASC) 2.5 MG tablet Take 2.5 mg by mouth daily.    . citalopram (CELEXA) 20 MG tablet Take 1 tablet (20 mg total) by mouth daily. CALL OFFICE FOR APPT SOON @ [phone] BEFORE YOUR RX RUNS OUT 90 tablet 0  . LORazepam (ATIVAN) 0.5 MG tablet Take 0.5 tablets (0.25 mg total) by mouth daily as needed for anxiety. 30 tablet 0  . losartan-hydrochlorothiazide (HYZAAR) 100-25 MG per tablet TAKE ONE (1) TABLET BY MOUTH EVERY DAY 90 tablet 1   No current facility-administered medications on file prior to visit.    Review of Systems  Constitutional: Negative for appetite change and unexpected weight change.  HENT: Negative for congestion and sinus pressure.   Respiratory: Negative for cough, chest tightness and shortness of breath.   Cardiovascular: Negative for chest pain, palpitations and leg swelling.  Gastrointestinal: Negative for nausea, vomiting, abdominal pain and diarrhea.  Genitourinary: Negative for dysuria and difficulty urinating.  Neurological: Negative for dizziness, light-headedness and headaches.  Psychiatric/Behavioral:       Appears to be handling stress relatively well.         Objective:     Blood pressure recheck:  114/78, pulse 84   Physical Exam  Constitutional: She appears well-developed and well-nourished. No distress.    HENT:  Nose: Nose normal.  Mouth/Throat: Oropharynx is clear and moist.  Neck: Neck supple. No thyromegaly present.  Cardiovascular: Normal rate and regular rhythm.   Pulmonary/Chest: Breath sounds normal. No respiratory distress. She has no wheezes.  Abdominal: Soft. Bowel sounds are normal. There is no tenderness.  Musculoskeletal: She exhibits no edema or tenderness.  Lymphadenopathy:    She has no cervical adenopathy.  Skin: No rash noted. No erythema.  Psychiatric: She has a normal mood and affect. Her behavior is normal.    BP 115/74 mmHg  Pulse 84  Temp(Src) 98 F (36.7 C) (Oral)  Ht 5' 2.5" (1.588 m)  Wt 147 lb (66.679 kg)  BMI 26.44 kg/m2  SpO2 97% Wt Readings from Last 3 Encounters:  11/24/14 147 lb (66.679 kg)  09/27/14 152 lb 6.4 oz (69.128 kg)  05/31/14 145 lb 8 oz (65.998 kg)     Lab Results  Component Value Date   WBC 6.4 02/15/2014   HGB 13.7 02/15/2014   HCT 40.6 02/15/2014   PLT 279.0 02/15/2014   GLUCOSE 91 02/15/2014   CHOL 184 02/15/2014   TRIG 126.0 02/15/2014   HDL 33.00* 02/15/2014   LDLCALC 126* 02/15/2014   ALT 17 02/15/2014   AST 18 02/15/2014   NA 139 02/15/2014   K 3.7 03/01/2014   CL 103 02/15/2014   CREATININE 0.8 02/15/2014   BUN 11 02/15/2014   CO2 29 02/15/2014   TSH  0.97 02/15/2014       Assessment & Plan:   Problem List Items Addressed This Visit    Anxiety    On citalopram and lorazepam.  Overall appears to be handling things well.  Follow.        Bradycardia    Does not appear to be an issue now.  Pulse 84.  Follow.       Essential hypertension, benign - Primary    Blood pressure doing well on current regimen.  Follow.  Continue same medications.  Check metabolic panel.       Relevant Orders   Comprehensive metabolic panel   Health care maintenance    Sees Dr Malva LimesMark Anderson.  States up to date with pap, pelvic and mammograms.  Obtain results.  Colonoscopy 2012.        Hypercholesterolemia    Low  cholesterol diet and exercise.  Follow lipid panel.        Relevant Orders   Lipid panel   Vitamin D deficiency    Check vitamin D level.        Relevant Orders   Vit D  25 hydroxy (rtn osteoporosis monitoring)       Dale DurhamSCOTT, Yanel Dombrosky, MD

## 2014-11-28 NOTE — Assessment & Plan Note (Signed)
Blood pressure doing well on current regimen.  Follow.  Continue same medications.  Check metabolic panel.

## 2014-11-28 NOTE — Assessment & Plan Note (Signed)
Low cholesterol diet and exercise.  Follow lipid panel.   

## 2014-11-28 NOTE — Assessment & Plan Note (Signed)
Sees Dr Mark Anderson.  States up to date with pap, pelvic and mammograms.  Obtain results.  Colonoscopy 2012.   

## 2014-11-28 NOTE — Assessment & Plan Note (Signed)
Does not appear to be an issue now.  Pulse 84.  Follow.

## 2014-11-28 NOTE — Assessment & Plan Note (Signed)
Check vitamin D level 

## 2014-11-28 NOTE — Assessment & Plan Note (Signed)
On citalopram and lorazepam.  Overall appears to be handling things well.  Follow.

## 2014-12-09 ENCOUNTER — Other Ambulatory Visit (INDEPENDENT_AMBULATORY_CARE_PROVIDER_SITE_OTHER): Payer: BLUE CROSS/BLUE SHIELD

## 2014-12-09 DIAGNOSIS — E7800 Pure hypercholesterolemia, unspecified: Secondary | ICD-10-CM

## 2014-12-09 DIAGNOSIS — I10 Essential (primary) hypertension: Secondary | ICD-10-CM

## 2014-12-09 DIAGNOSIS — E559 Vitamin D deficiency, unspecified: Secondary | ICD-10-CM

## 2014-12-09 LAB — LIPID PANEL
CHOLESTEROL: 198 mg/dL (ref 0–200)
HDL: 35.5 mg/dL — ABNORMAL LOW (ref >39.00)
LDL CALC: 142 mg/dL — AB (ref 0–99)
NonHDL: 162.5
TRIGLYCERIDES: 103 mg/dL (ref 0.0–149.0)
Total CHOL/HDL Ratio: 6
VLDL: 20.6 mg/dL (ref 0.0–40.0)

## 2014-12-09 LAB — COMPREHENSIVE METABOLIC PANEL
ALBUMIN: 3.8 g/dL (ref 3.5–5.2)
ALK PHOS: 73 U/L (ref 39–117)
ALT: 11 U/L (ref 0–35)
AST: 15 U/L (ref 0–37)
BILIRUBIN TOTAL: 0.4 mg/dL (ref 0.2–1.2)
BUN: 11 mg/dL (ref 6–23)
CO2: 28 mEq/L (ref 19–32)
Calcium: 9.1 mg/dL (ref 8.4–10.5)
Chloride: 104 mEq/L (ref 96–112)
Creatinine, Ser: 0.84 mg/dL (ref 0.40–1.20)
GFR: 74.13 mL/min (ref >60.00)
GLUCOSE: 93 mg/dL (ref 70–99)
POTASSIUM: 3.4 meq/L — AB (ref 3.5–5.1)
Sodium: 139 mEq/L (ref 135–145)
TOTAL PROTEIN: 7 g/dL (ref 6.0–8.3)

## 2014-12-09 LAB — VITAMIN D 25 HYDROXY (VIT D DEFICIENCY, FRACTURES): VITD: 16.78 ng/mL — ABNORMAL LOW (ref 30.00–100.00)

## 2014-12-10 ENCOUNTER — Other Ambulatory Visit: Payer: Self-pay | Admitting: Internal Medicine

## 2014-12-10 DIAGNOSIS — E876 Hypokalemia: Secondary | ICD-10-CM

## 2014-12-10 NOTE — Progress Notes (Signed)
Order placed for f/u potassium.  

## 2014-12-28 ENCOUNTER — Other Ambulatory Visit (INDEPENDENT_AMBULATORY_CARE_PROVIDER_SITE_OTHER): Payer: BLUE CROSS/BLUE SHIELD

## 2014-12-28 DIAGNOSIS — E876 Hypokalemia: Secondary | ICD-10-CM | POA: Diagnosis not present

## 2015-01-03 LAB — POTASSIUM: POTASSIUM: 4.3 meq/L (ref 3.5–5.1)

## 2015-01-04 ENCOUNTER — Encounter: Payer: Self-pay | Admitting: *Deleted

## 2015-02-09 ENCOUNTER — Other Ambulatory Visit: Payer: Self-pay | Admitting: Internal Medicine

## 2015-02-23 ENCOUNTER — Other Ambulatory Visit: Payer: Self-pay | Admitting: Internal Medicine

## 2015-03-07 ENCOUNTER — Other Ambulatory Visit: Payer: Self-pay | Admitting: Internal Medicine

## 2015-03-07 NOTE — Telephone Encounter (Signed)
ok'd refill lorazepam #30 with no refills.   

## 2015-03-07 NOTE — Telephone Encounter (Signed)
Last OV 3.16.16, last refill 4.24.16.  Please advise refill

## 2015-03-08 NOTE — Telephone Encounter (Signed)
Rx faxed

## 2015-03-23 ENCOUNTER — Encounter: Payer: Self-pay | Admitting: Internal Medicine

## 2015-03-23 ENCOUNTER — Ambulatory Visit (INDEPENDENT_AMBULATORY_CARE_PROVIDER_SITE_OTHER): Payer: BLUE CROSS/BLUE SHIELD | Admitting: Internal Medicine

## 2015-03-23 VITALS — BP 110/70 | HR 71 | Temp 98.5°F | Ht 62.5 in | Wt 144.4 lb

## 2015-03-23 DIAGNOSIS — I10 Essential (primary) hypertension: Secondary | ICD-10-CM | POA: Diagnosis not present

## 2015-03-23 DIAGNOSIS — E559 Vitamin D deficiency, unspecified: Secondary | ICD-10-CM | POA: Diagnosis not present

## 2015-03-23 DIAGNOSIS — E7800 Pure hypercholesterolemia, unspecified: Secondary | ICD-10-CM

## 2015-03-23 DIAGNOSIS — F419 Anxiety disorder, unspecified: Secondary | ICD-10-CM | POA: Diagnosis not present

## 2015-03-23 DIAGNOSIS — G479 Sleep disorder, unspecified: Secondary | ICD-10-CM

## 2015-03-23 DIAGNOSIS — Z0000 Encounter for general adult medical examination without abnormal findings: Secondary | ICD-10-CM

## 2015-03-23 MED ORDER — CLONAZEPAM 0.5 MG PO TABS: ORAL_TABLET | ORAL | Status: DC

## 2015-03-23 NOTE — Progress Notes (Signed)
Pre visit review using our clinic review tool, if applicable. No additional management support is needed unless otherwise documented below in the visit note. 

## 2015-03-23 NOTE — Progress Notes (Signed)
Patient ID: Kim Love, female   DOB: Sep 08, 1956, 59 y.o.   MRN: [id number]   Subjective:    Patient ID: Kim Love, female    DOB: 1955-10-18, 59 y.o.   MRN: [id number]  HPI  Patient here for a scheduled follow up.  Increased stress with family issues.  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  She is on citalopram.  Not sleeping.  Needs something to help her sleep.  She is no longer taking the lorazepam.  Tries to stay active.  No cardiac symptoms with increased activity or exertion.  No sob.  Eating and drinking well.  Bowels stable.     Past Medical History  Diagnosis Date  . Hypertension   . Hypercholesterolemia   . Anxiety     Current Outpatient Prescriptions on File Prior to Visit  Medication Sig Dispense Refill  . amLODipine (NORVASC) 2.5 MG tablet Take 2.5 mg by mouth daily.    . citalopram (CELEXA) 20 MG tablet TAKE ONE (1) TABLET EACH DAY 90 tablet 0  . losartan-hydrochlorothiazide (HYZAAR) 100-25 MG per tablet TAKE ONE (1) TABLET BY MOUTH EVERY DAY 90 tablet 1   No current facility-administered medications on file prior to visit.    Review of Systems  Constitutional: Negative for appetite change and unexpected weight change.  HENT: Negative for congestion and sinus pressure.   Respiratory: Negative for cough, chest tightness and shortness of breath.   Cardiovascular: Negative for chest pain, palpitations and leg swelling.  Gastrointestinal: Negative for nausea, vomiting, abdominal pain and diarrhea.  Genitourinary: Negative for dysuria and difficulty urinating.  Skin: Negative for color change and rash.  Neurological: Negative for dizziness, light-headedness and headaches.  Psychiatric/Behavioral: Negative for dysphoric mood and agitation.       Objective:     Blood pressure recheck:  120/78  Physical Exam  Constitutional: She appears well-developed and well-nourished. No distress.  HENT:  Nose: Nose normal.  Mouth/Throat: Oropharynx is clear  and moist.  Neck: Neck supple. No thyromegaly present.  Cardiovascular: Normal rate and regular rhythm.   Pulmonary/Chest: Breath sounds normal. No respiratory distress. She has no wheezes.  Abdominal: Soft. Bowel sounds are normal. There is no tenderness.  Musculoskeletal: She exhibits no edema or tenderness.  Lymphadenopathy:    She has no cervical adenopathy.  Skin: No rash noted. No erythema.  Psychiatric: She has a normal mood and affect. Her behavior is normal.    BP 110/70 mmHg  Pulse 71  Temp(Src) 98.5 F (36.9 C) (Oral)  Ht 5' 2.5" (1.588 m)  Wt 144 lb 6 oz (65.488 kg)  BMI 25.97 kg/m2  SpO2 97% Wt Readings from Last 3 Encounters:  03/23/15 144 lb 6 oz (65.488 kg)  11/24/14 147 lb (66.679 kg)  09/27/14 152 lb 6.4 oz (69.128 kg)     Lab Results  Component Value Date   WBC 6.4 02/15/2014   HGB 13.7 02/15/2014   HCT 40.6 02/15/2014   PLT 279.0 02/15/2014   GLUCOSE 93 12/09/2014   CHOL 198 12/09/2014   TRIG 103.0 12/09/2014   HDL 35.50* 12/09/2014   LDLCALC 142* 12/09/2014   ALT 11 12/09/2014   AST 15 12/09/2014   NA 139 12/09/2014   K 4.3 12/28/2014   CL 104 12/09/2014   CREATININE 0.84 12/09/2014   BUN 11 12/09/2014   CO2 28 12/09/2014   TSH 0.97 02/15/2014       Assessment & Plan:   Problem List Items Addressed  This Visit    Anxiety    Doing well on citalopram.  Needs something to help her sleep.  Lorazepam not working.  Will try clonazepam as directed.  Follow.        Essential hypertension, benign - Primary    Blood pressure under god control.  Same medication regimen.  Follow pressures.  Follow metabolic panel.       Relevant Orders   CBC with Differential/Platelet   Comprehensive metabolic panel   Health care maintenance    Sees Dr Malva LimesMark Anderson.  States up to date with pap, pelvic and mammograms.  Obtain results.  Colonoscopy 2012.        Hypercholesterolemia    Low cholesterol diet and exercise.  Follow lipid panel.        Relevant  Orders   Lipid panel   Sleeping difficulties    Unable to sleep as outlined.  Clonazepam as directed.  Follow.        Relevant Orders   TSH   Vitamin D deficiency    Follow vitamin D level.         I spent 25 minutes with the patient and more than 50% of the time was spent in consultation regarding the above.     Dale DurhamSCOTT, Demesha Boorman, MD

## 2015-03-24 ENCOUNTER — Telehealth: Payer: Self-pay | Admitting: *Deleted

## 2015-03-24 ENCOUNTER — Encounter: Payer: Self-pay | Admitting: Internal Medicine

## 2015-03-24 DIAGNOSIS — G479 Sleep disorder, unspecified: Secondary | ICD-10-CM | POA: Insufficient documentation

## 2015-03-24 NOTE — Telephone Encounter (Signed)
Called pt and informed her to take her citalopram in the am and clonazepam in the evening.

## 2015-03-24 NOTE — Assessment & Plan Note (Signed)
Doing well on citalopram.  Needs something to help her sleep.  Lorazepam not working.  Will try clonazepam as directed.  Follow.

## 2015-03-24 NOTE — Assessment & Plan Note (Signed)
Sees Dr Malva LimesMark Anderson.  States up to date with pap, pelvic and mammograms.  Obtain results.  Colonoscopy 2012.

## 2015-03-24 NOTE — Assessment & Plan Note (Signed)
Blood pressure under god control.  Same medication regimen.  Follow pressures.  Follow metabolic panel.

## 2015-03-24 NOTE — Assessment & Plan Note (Signed)
Unable to sleep as outlined.  Clonazepam as directed.  Follow.

## 2015-03-24 NOTE — Telephone Encounter (Signed)
Wants to know if she is suppose to continue taking to her Celexa in addition to the new medication (Clonazepam). Please advise

## 2015-03-24 NOTE — Assessment & Plan Note (Signed)
Low cholesterol diet and exercise.  Follow lipid panel.   

## 2015-03-24 NOTE — Assessment & Plan Note (Signed)
Follow vitamin D level.  

## 2015-03-30 ENCOUNTER — Ambulatory Visit: Payer: Self-pay | Admitting: Internal Medicine

## 2015-04-01 ENCOUNTER — Encounter: Payer: Self-pay | Admitting: Internal Medicine

## 2015-04-12 ENCOUNTER — Other Ambulatory Visit (INDEPENDENT_AMBULATORY_CARE_PROVIDER_SITE_OTHER): Payer: BLUE CROSS/BLUE SHIELD

## 2015-04-12 ENCOUNTER — Other Ambulatory Visit: Payer: Self-pay

## 2015-04-12 DIAGNOSIS — I10 Essential (primary) hypertension: Secondary | ICD-10-CM | POA: Diagnosis not present

## 2015-04-12 DIAGNOSIS — E7800 Pure hypercholesterolemia, unspecified: Secondary | ICD-10-CM

## 2015-04-12 DIAGNOSIS — G479 Sleep disorder, unspecified: Secondary | ICD-10-CM | POA: Diagnosis not present

## 2015-04-12 LAB — COMPREHENSIVE METABOLIC PANEL
ALT: 15 U/L (ref 0–35)
AST: 18 U/L (ref 0–37)
Albumin: 3.8 g/dL (ref 3.5–5.2)
Alkaline Phosphatase: 77 U/L (ref 39–117)
BUN: 9 mg/dL (ref 6–23)
CO2: 29 meq/L (ref 19–32)
Calcium: 9.2 mg/dL (ref 8.4–10.5)
Chloride: 103 mEq/L (ref 96–112)
Creatinine, Ser: 0.76 mg/dL (ref 0.40–1.20)
GFR: 83.11 mL/min (ref >60.00)
Glucose, Bld: 88 mg/dL (ref 70–99)
POTASSIUM: 3.8 meq/L (ref 3.5–5.1)
Sodium: 138 mEq/L (ref 135–145)
Total Bilirubin: 0.5 mg/dL (ref 0.2–1.2)
Total Protein: 7 g/dL (ref 6.0–8.3)

## 2015-04-12 LAB — CBC WITH DIFFERENTIAL/PLATELET
BASOS PCT: 0.7 % (ref 0.0–3.0)
Basophils Absolute: 0 10*3/uL (ref 0.0–0.1)
EOS ABS: 0.4 10*3/uL (ref 0.0–0.7)
Eosinophils Relative: 5.5 % — ABNORMAL HIGH (ref 0.0–5.0)
HEMATOCRIT: 43 % (ref 36.0–46.0)
Hemoglobin: 14.5 g/dL (ref 12.0–15.0)
LYMPHS ABS: 1.8 10*3/uL (ref 0.7–4.0)
Lymphocytes Relative: 26.2 % (ref 12.0–46.0)
MCHC: 33.7 g/dL (ref 30.0–36.0)
MCV: 90.6 fl (ref 78.0–100.0)
Monocytes Absolute: 0.3 10*3/uL (ref 0.1–1.0)
Monocytes Relative: 3.9 % (ref 3.0–12.0)
NEUTROS PCT: 63.7 % (ref 43.0–77.0)
Neutro Abs: 4.3 10*3/uL (ref 1.4–7.7)
Platelets: 275 10*3/uL (ref 150.0–400.0)
RBC: 4.75 Mil/uL (ref 3.87–5.11)
RDW: 13.3 % (ref 11.5–15.5)
WBC: 6.8 10*3/uL (ref 4.0–10.5)

## 2015-04-12 LAB — LIPID PANEL
Cholesterol: 206 mg/dL — ABNORMAL HIGH (ref 0–200)
HDL: 38.5 mg/dL — AB (ref >39.00)
LDL Cholesterol: 143 mg/dL — ABNORMAL HIGH (ref 0–99)
NonHDL: 167.06
Total CHOL/HDL Ratio: 5
Triglycerides: 120 mg/dL (ref 0.0–149.0)
VLDL: 24 mg/dL (ref 0.0–40.0)

## 2015-04-12 LAB — TSH: TSH: 0.73 u[IU]/mL (ref 0.35–4.50)

## 2015-04-13 ENCOUNTER — Other Ambulatory Visit: Payer: Self-pay | Admitting: *Deleted

## 2015-04-13 DIAGNOSIS — E875 Hyperkalemia: Secondary | ICD-10-CM

## 2015-04-13 MED ORDER — PRAVASTATIN SODIUM 10 MG PO TABS: 10.0000 mg | ORAL_TABLET | Freq: Every day | ORAL | Status: DC

## 2015-05-11 ENCOUNTER — Other Ambulatory Visit: Payer: Self-pay | Admitting: Internal Medicine

## 2015-05-12 NOTE — Telephone Encounter (Signed)
Last OV 7.13.16.  Please advise refill 

## 2015-05-13 NOTE — Telephone Encounter (Signed)
Refilled citalopram #90 with 1 refill.

## 2015-05-26 ENCOUNTER — Other Ambulatory Visit (INDEPENDENT_AMBULATORY_CARE_PROVIDER_SITE_OTHER): Payer: BLUE CROSS/BLUE SHIELD

## 2015-05-26 DIAGNOSIS — E875 Hyperkalemia: Secondary | ICD-10-CM | POA: Diagnosis not present

## 2015-05-26 LAB — HEPATIC FUNCTION PANEL
ALBUMIN: 4 g/dL (ref 3.5–5.2)
ALT: 9 U/L (ref 0–35)
AST: 14 U/L (ref 0–37)
Alkaline Phosphatase: 86 U/L (ref 39–117)
BILIRUBIN DIRECT: 0.1 mg/dL (ref 0.0–0.3)
TOTAL PROTEIN: 7.2 g/dL (ref 6.0–8.3)
Total Bilirubin: 0.5 mg/dL (ref 0.2–1.2)

## 2015-05-27 ENCOUNTER — Encounter: Payer: Self-pay | Admitting: *Deleted

## 2015-05-31 ENCOUNTER — Ambulatory Visit (INDEPENDENT_AMBULATORY_CARE_PROVIDER_SITE_OTHER): Payer: BLUE CROSS/BLUE SHIELD | Admitting: Internal Medicine

## 2015-05-31 ENCOUNTER — Encounter: Payer: Self-pay | Admitting: Internal Medicine

## 2015-05-31 VITALS — BP 110/70 | HR 82 | Temp 98.5°F | Ht 62.5 in | Wt 143.5 lb

## 2015-05-31 DIAGNOSIS — F419 Anxiety disorder, unspecified: Secondary | ICD-10-CM

## 2015-05-31 DIAGNOSIS — E7800 Pure hypercholesterolemia, unspecified: Secondary | ICD-10-CM

## 2015-05-31 DIAGNOSIS — G479 Sleep disorder, unspecified: Secondary | ICD-10-CM

## 2015-05-31 DIAGNOSIS — Z23 Encounter for immunization: Secondary | ICD-10-CM | POA: Diagnosis not present

## 2015-05-31 DIAGNOSIS — I10 Essential (primary) hypertension: Secondary | ICD-10-CM | POA: Diagnosis not present

## 2015-05-31 NOTE — Progress Notes (Signed)
Pre-visit discussion using our clinic review tool. No additional management support is needed unless otherwise documented below in the visit note.  

## 2015-05-31 NOTE — Progress Notes (Signed)
Patient ID: Kim Love, female   DOB: 01/20/1956, 59 y.o.   MRN: [id number]   Subjective:    Patient ID: Kim Love, female    DOB: 1956-02-16, 59 y.o.   MRN: [id number]  HPI  Patient with past history of anxiety, hypercholesterolemia and hypertension who comes in today to follow up on these issues.  She is on citalopram.  Feels she is handling stress relatively well.  Does not feel she needs any further intervention.  Tries to stay active.  No cardiac symptoms with increased activity or exertion.  No sob.  Blood pressure has been running low.    No nausea or vomiting.  Bowels stable.     Past Medical History  Diagnosis Date  . Hypertension   . Hypercholesterolemia   . Anxiety    Past Surgical History  Procedure Laterality Date  . Tonsillectomy and adenoidectomy Bilateral 1980   Family History  Problem Relation Age of Onset  . Stroke Maternal Grandmother    Social History   Social History  . Marital Status: Married    Spouse Name: N/A  . Number of Children: 1  . Years of Education: N/A   Occupational History  .  [employer]   Social History Main Topics  . Smoking status: Former Smoker    Types: Cigarettes    Quit date: 09/10/2012  . Smokeless tobacco: Current User  . Alcohol Use: No  . Drug Use: No  . Sexual Activity: Not Asked   Other Topics Concern  . None   Social History Narrative    Outpatient Encounter Prescriptions as of 05/31/2015  Medication Sig  . citalopram (CELEXA) 20 MG tablet TAKE ONE (1) TABLET EACH DAY  . clonazePAM (KLONOPIN) 0.5 MG tablet 1/2 - 1 tablet q hs prn  . losartan-hydrochlorothiazide (HYZAAR) 100-25 MG per tablet TAKE ONE (1) TABLET BY MOUTH EVERY DAY  . [DISCONTINUED] amLODipine (NORVASC) 2.5 MG tablet Take 2.5 mg by mouth daily.  . [DISCONTINUED] pravastatin (PRAVACHOL) 10 MG tablet Take 1 tablet (10 mg total) by mouth daily. (Patient not taking: Reported on 05/31/2015)   No facility-administered encounter medications on file  as of 05/31/2015.    Review of Systems  Constitutional: Negative for appetite change and unexpected weight change.  HENT: Negative for congestion and sinus pressure.   Eyes: Negative for pain and visual disturbance.  Respiratory: Negative for cough, chest tightness and shortness of breath.   Cardiovascular: Negative for chest pain, palpitations and leg swelling.  Gastrointestinal: Negative for nausea, vomiting, abdominal pain and diarrhea.  Genitourinary: Negative for dysuria and difficulty urinating.  Musculoskeletal: Negative for back pain and joint swelling.  Skin: Negative for color change and rash.  Neurological: Negative for dizziness, light-headedness and headaches.  Psychiatric/Behavioral: Negative for dysphoric mood and agitation.       Objective:    Physical Exam  Constitutional: She appears well-developed and well-nourished. No distress.  HENT:  Nose: Nose normal.  Mouth/Throat: Oropharynx is clear and moist.  Eyes: Conjunctivae are normal. Right eye exhibits no discharge. Left eye exhibits no discharge.  Neck: Neck supple. No thyromegaly present.  Cardiovascular: Normal rate and regular rhythm.   Pulmonary/Chest: Breath sounds normal. No respiratory distress. She has no wheezes.  Abdominal: Soft. Bowel sounds are normal. There is no tenderness.  Musculoskeletal: She exhibits no edema or tenderness.  Lymphadenopathy:    She has no cervical adenopathy.  Skin: No rash noted. No erythema.  Psychiatric: She has a normal mood and  affect. Her behavior is normal.    BP 110/70 mmHg  Pulse 82  Temp(Src) 98.5 F (36.9 C) (Oral)  Ht 5' 2.5" (1.588 m)  Wt 143 lb 8 oz (65.091 kg)  BMI 25.81 kg/m2  SpO2 96% Wt Readings from Last 3 Encounters:  05/31/15 143 lb 8 oz (65.091 kg)  03/23/15 144 lb 6 oz (65.488 kg)  11/24/14 147 lb (66.679 kg)     Lab Results  Component Value Date   WBC 6.8 04/12/2015   HGB 14.5 04/12/2015   HCT 43.0 04/12/2015   PLT 275.0 04/12/2015    GLUCOSE 88 04/12/2015   CHOL 206* 04/12/2015   TRIG 120.0 04/12/2015   HDL 38.50* 04/12/2015   LDLCALC 143* 04/12/2015   ALT 9 05/26/2015   AST 14 05/26/2015   NA 138 04/12/2015   K 3.8 04/12/2015   CL 103 04/12/2015   CREATININE 0.76 04/12/2015   BUN 9 04/12/2015   CO2 29 04/12/2015   TSH 0.73 04/12/2015       Assessment & Plan:   Problem List Items Addressed This Visit    Anxiety    Discussed with her today.  On citalopram.  Takes clonazepam prn to help her sleep.  Doing better.  Desires no further intervention.  Follow.        Essential hypertension    Blood pressure is running lower.   Discussed with her today.  Discussed adjusting her medication.  Will stop amlodipine.  Continue losartan/hctz.  Follow pressures.  Follow metabolic panel.        Hypercholesterolemia    Low cholesterol diet and exercise.  Not taking pravastatin.  Desires not to take.  Discussed with her today.  Wants to try diet and exercise.  Follow lipid panel and liver function tests.        Sleeping difficulties    Clonazepam prn.  Follow.  Doing better.         Other Visit Diagnoses    Encounter for immunization    -  Primary        Dale Marvin, MD

## 2015-05-31 NOTE — Patient Instructions (Addendum)
Influenza Virus Vaccine injection (Fluarix) What is this medicine? INFLUENZA VIRUS VACCINE (in floo EN zuh VAHY ruhs vak SEEN) helps to reduce the risk of getting influenza also known as the flu. This medicine may be used for other purposes; ask your health care Kim Love or pharmacist if you have questions. COMMON BRAND NAME(S): Fluarix, Fluzone What should I tell my health care Kim Love before I take this medicine? They need to know if you have any of these conditions: -bleeding disorder like hemophilia -fever or infection -Guillain-Barre syndrome or other neurological problems -immune system problems -infection with the human immunodeficiency virus (HIV) or AIDS -low blood platelet counts -multiple sclerosis -an unusual or allergic reaction to influenza virus vaccine, eggs, chicken proteins, latex, gentamicin, other medicines, foods, dyes or preservatives -pregnant or trying to get pregnant -breast-feeding How should I use this medicine? This vaccine is for injection into a muscle. It is given by a health care professional. A copy of Vaccine Information Statements will be given before each vaccination. Read this sheet carefully each time. The sheet may change frequently. Talk to your pediatrician regarding the use of this medicine in children. Special care may be needed. Overdosage: If you think you have taken too much of this medicine contact a poison control center or emergency room at once. NOTE: This medicine is only for you. Do not share this medicine with others. What if I miss a dose? This does not apply. What may interact with this medicine? -chemotherapy or radiation therapy -medicines that lower your immune system like etanercept, anakinra, infliximab, and adalimumab -medicines that treat or prevent blood clots like warfarin -phenytoin -steroid medicines like prednisone or cortisone -theophylline -vaccines This list may not describe all possible interactions. Give your  health care Kim Love a list of all the medicines, herbs, non-prescription drugs, or dietary supplements you use. Also tell them if you smoke, drink alcohol, or use illegal drugs. Some items may interact with your medicine. What should I watch for while using this medicine? Report any side effects that do not go away within 3 days to your doctor or health care professional. Call your health care Kim Love if any unusual symptoms occur within 6 weeks of receiving this vaccine. You may still catch the flu, but the illness is not usually as bad. You cannot get the flu from the vaccine. The vaccine will not protect against colds or other illnesses that may cause fever. The vaccine is needed every year. What side effects may I notice from receiving this medicine? Side effects that you should report to your doctor or health care professional as soon as possible: -allergic reactions like skin rash, itching or hives, swelling of the face, lips, or tongue Side effects that usually do not require medical attention (report to your doctor or health care professional if they continue or are bothersome): -fever -headache -muscle aches and pains -pain, tenderness, redness, or swelling at site where injected -weak or tired This list may not describe all possible side effects. Call your doctor for medical advice about side effects. You may report side effects to FDA at 1-800-FDA-1088. Where should I keep my medicine? This vaccine is only given in a clinic, pharmacy, doctor's office, or other health care setting and will not be stored at home. NOTE: This sheet is a summary. It may not cover all possible information. If you have questions about this medicine, talk to your doctor, pharmacist, or health care Kim Love.  2015, Elsevier/Gold Standard. (2008-03-24 09:30:40)   Stop amlodipine.

## 2015-06-06 ENCOUNTER — Encounter: Payer: Self-pay | Admitting: Internal Medicine

## 2015-06-06 NOTE — Assessment & Plan Note (Signed)
Clonazepam prn.  Follow.  Doing better.

## 2015-06-06 NOTE — Assessment & Plan Note (Signed)
Blood pressure is running lower.   Discussed with her today.  Discussed adjusting her medication.  Will stop amlodipine.  Continue losartan/hctz.  Follow pressures.  Follow metabolic panel.

## 2015-06-06 NOTE — Assessment & Plan Note (Addendum)
Low cholesterol diet and exercise.  Not taking pravastatin.  Desires not to take.  Discussed with her today.  Wants to try diet and exercise.  Follow lipid panel and liver function tests.

## 2015-06-06 NOTE — Assessment & Plan Note (Signed)
Discussed with her today.  On citalopram.  Takes clonazepam prn to help her sleep.  Doing better.  Desires no further intervention.  Follow.

## 2015-06-11 ENCOUNTER — Other Ambulatory Visit: Payer: Self-pay | Admitting: Internal Medicine

## 2015-06-17 ENCOUNTER — Ambulatory Visit (INDEPENDENT_AMBULATORY_CARE_PROVIDER_SITE_OTHER): Payer: BLUE CROSS/BLUE SHIELD | Admitting: Family Medicine

## 2015-06-17 ENCOUNTER — Encounter: Payer: Self-pay | Admitting: Family Medicine

## 2015-06-17 VITALS — BP 124/82 | HR 72 | Temp 98.3°F | Ht 62.5 in | Wt 145.0 lb

## 2015-06-17 DIAGNOSIS — J069 Acute upper respiratory infection, unspecified: Secondary | ICD-10-CM | POA: Diagnosis not present

## 2015-06-17 MED ORDER — MOMETASONE FUROATE 50 MCG/ACT NA SUSP: 2.0000 | Freq: Every day | NASAL | Status: DC

## 2015-06-17 MED ORDER — CETIRIZINE HCL 10 MG PO TABS: 10.0000 mg | ORAL_TABLET | Freq: Every day | ORAL | Status: DC

## 2015-06-17 NOTE — Progress Notes (Signed)
Pre visit review using our clinic review tool, if applicable. No additional management support is needed unless otherwise documented below in the visit note. 

## 2015-06-17 NOTE — Patient Instructions (Addendum)
Nice to meet you. You likely have a viral illness leading to your symptoms.  We will treat you with nasonex, zyrtec, and nasal saline. If you develop worsening headache, fevers, numbness, weakness, tingling, productive cough with blood or further blood streaks, neck stiffness, worsening fatigue, shortness of breath, or chest pain please seek medical attention. If not improved in the next 2-3 days please follow-up.

## 2015-06-17 NOTE — Progress Notes (Signed)
Patient ID: Kim Love, female   DOB: [birth year], 59 y.o.   MRN: [id number]  [name], MD Phone: [phone]  Kim Love is a 59 y.o. female who presents today for same day appointment.   URI: patient reports 3 days of post nasal drip, mild headache, rhinorrhea, and mildly productive cough. Notes nose feels stopped up. Is tired. Cough productive of yellow mucus, had single episode of yellow mucus with minimal blood streaking yesterday. Has only been coughing up yellow mucus since then. No productive cough today. No fevers. No weakness, numbness, or vision changes. No CP or SOB. Has not tried any medications for this.   PMH: former smoker.    ROS see HPI  Objective  Physical Exam Filed Vitals:   06/17/15 0945  BP: 124/82  Pulse: 72  Temp: 98.3 F (36.8 C)    Physical Exam  Constitutional: She is well-developed, well-nourished, and in no distress.  HENT:  Head: Normocephalic and atraumatic.  Right Ear: External ear normal.  Left Ear: External ear normal.  Mouth/Throat: No oropharyngeal exudate.  Normal TMs bilaterally, minimal posterior OP erythema, no tonsillar swelling  Eyes: Conjunctivae are normal. Pupils are equal, round, and reactive to light.  Neck: Normal range of motion. Neck supple.  Cardiovascular: Normal rate, regular rhythm and normal heart sounds.  Exam reveals no gallop and no friction rub.   No murmur heard. Pulmonary/Chest: Effort normal and breath sounds normal. No respiratory distress. She has no wheezes. She has no rales.  Lymphadenopathy:    She has no cervical adenopathy.  Neurological: She is alert.  CN 2-12 intact, 5/5 strength in bilateral biceps, triceps, grip, quads, hamstrings, plantar and dorsiflexion, sensation to light touch intact in bilateral UE and LE, normal gait, 2+ patellar reflexes  Skin: Skin is warm and dry. She is not diaphoretic.     Assessment/Plan: Please see individual problem list.  URI (upper respiratory  infection) Symptoms consistent with viral URI. Could be allergic rhinitis, though more consistent with viral illness. Single episode with minimal blood streaked sputum is likely related to viral irritation. Has not had any additional blood streaked sputum since that single occurrence. Normal lung exam, lack of fever, and normal O2 sat makes PNA unlikely. Short duration of symptoms and no sinus congestion make bacterial sinusitis less likely. Is well appearing. Neuro intact. Will treat with nasal saline, nasonex, and zyrtec. Stay well hydrated. Given return precautions.     Meds ordered this encounter  Medications  . mometasone (NASONEX) 50 MCG/ACT nasal spray    Sig: Place 2 sprays into the nose daily.    Dispense:  17 g    Refill:  0  . cetirizine (ZYRTEC) 10 MG tablet    Sig: Take 1 tablet (10 mg total) by mouth daily.    Dispense:  30 tablet    Refill:  1    [name]

## 2015-06-17 NOTE — Assessment & Plan Note (Signed)
Symptoms consistent with viral URI. Could be allergic rhinitis, though more consistent with viral illness. Single episode with minimal blood streaked sputum is likely related to viral irritation. Has not had any additional blood streaked sputum since that single occurrence. Normal lung exam, lack of fever, and normal O2 sat makes PNA unlikely. Short duration of symptoms and no sinus congestion make bacterial sinusitis less likely. Is well appearing. Neuro intact. Will treat with nasal saline, nasonex, and zyrtec. Stay well hydrated. Given return precautions.

## 2015-06-23 ENCOUNTER — Telehealth: Payer: Self-pay | Admitting: *Deleted

## 2015-06-23 NOTE — Telephone Encounter (Signed)
Reviewed her blood pressure readings.  Some readings higher, but overall ok.  (only had two days of readings).  Would recommend checking 2-3x/week and send in readings.  Let us know if any problems.

## 2015-06-23 NOTE — Telephone Encounter (Signed)
pt dropped off readings (placed in green folder)

## 2015-06-23 NOTE — Telephone Encounter (Signed)
Left info on voicemail

## 2015-08-01 ENCOUNTER — Encounter: Payer: Self-pay | Admitting: Internal Medicine

## 2015-08-01 ENCOUNTER — Ambulatory Visit (INDEPENDENT_AMBULATORY_CARE_PROVIDER_SITE_OTHER): Payer: BLUE CROSS/BLUE SHIELD | Admitting: Internal Medicine

## 2015-08-01 VITALS — BP 110/80 | HR 72 | Temp 98.2°F | Resp 18 | Ht 62.5 in | Wt 144.0 lb

## 2015-08-01 DIAGNOSIS — E559 Vitamin D deficiency, unspecified: Secondary | ICD-10-CM | POA: Diagnosis not present

## 2015-08-01 DIAGNOSIS — G479 Sleep disorder, unspecified: Secondary | ICD-10-CM | POA: Diagnosis not present

## 2015-08-01 DIAGNOSIS — E7800 Pure hypercholesterolemia, unspecified: Secondary | ICD-10-CM

## 2015-08-01 DIAGNOSIS — R05 Cough: Secondary | ICD-10-CM

## 2015-08-01 DIAGNOSIS — R059 Cough, unspecified: Secondary | ICD-10-CM

## 2015-08-01 DIAGNOSIS — I10 Essential (primary) hypertension: Secondary | ICD-10-CM | POA: Diagnosis not present

## 2015-08-01 DIAGNOSIS — F419 Anxiety disorder, unspecified: Secondary | ICD-10-CM

## 2015-08-01 NOTE — Progress Notes (Signed)
Pre-visit discussion using our clinic review tool. No additional management support is needed unless otherwise documented below in the visit note.  

## 2015-08-01 NOTE — Progress Notes (Signed)
Patient ID: Kim Love, female   DOB: [birth year], 59 y.o.   MRN: [id number]   Subjective:    Patient ID: Kim Love, female    DOB: [birth year], 59 y.o.   MRN: [id number]  HPI  Patient with past history of hypercholesterolemia, anxiety and hypertension who comes in today to follow up on these issues.  She is off amlodipine.  Blood pressure has been doing well.  Stays active.  Does not do regular exercise.  No cardiac symptoms with increased activity or exertion.  No sob.  No acid reflux reported.  No abdominal pain or cramping.  Some increased nasal congestion in the am.  Drains down at night.  Cough - productive of blood tinged mucus - she relates to the drainage and nose bleed.  No chest congestion.  No sob.  Sees Dr Malva LimesMark Anderson for her gyn exams.  Mammogram scheduled in 08/2015.     Past Medical History  Diagnosis Date  . Hypertension   . Hypercholesterolemia   . Anxiety    Past Surgical History  Procedure Laterality Date  . Tonsillectomy and adenoidectomy Bilateral 1980   Family History  Problem Relation Age of Onset  . Stroke Maternal Grandmother    Social History   Social History  . Marital Status: Married    Spouse Name: N/A  . Number of Children: 1  . Years of Education: N/A   Occupational History  .  [employer]   Social History Main Topics  . Smoking status: Former Smoker    Types: Cigarettes    Quit date: 09/10/2012  . Smokeless tobacco: Current User  . Alcohol Use: No  . Drug Use: No  . Sexual Activity: Not Asked   Other Topics Concern  . None   Social History Narrative    Outpatient Encounter Prescriptions as of 08/01/2015  Medication Sig  . cetirizine (ZYRTEC) 10 MG tablet Take 1 tablet (10 mg total) by mouth daily.  . citalopram (CELEXA) 20 MG tablet TAKE ONE (1) TABLET EACH DAY  . clonazePAM (KLONOPIN) 0.5 MG tablet TAKE 1/2 TO 1 TABLET AT BEDTIME  . losartan-hydrochlorothiazide (HYZAAR) 100-25 MG per tablet TAKE ONE (1) TABLET BY MOUTH  EVERY DAY  . mometasone (NASONEX) 50 MCG/ACT nasal spray Place 2 sprays into the nose daily.  . pravastatin (PRAVACHOL) 10 MG tablet TAKE ONE (1) TABLET EACH DAY   No facility-administered encounter medications on file as of 08/01/2015.    Review of Systems  Constitutional: Negative for appetite change and unexpected weight change.  HENT: Positive for congestion and postnasal drip. Negative for sinus pressure.   Eyes: Negative for pain and discharge.  Respiratory: Negative for cough, chest tightness and shortness of breath.   Cardiovascular: Negative for chest pain, palpitations and leg swelling.  Gastrointestinal: Negative for nausea, vomiting, abdominal pain and diarrhea.  Genitourinary: Negative for dysuria and difficulty urinating.  Musculoskeletal: Negative for back pain and joint swelling.  Skin: Negative for color change and rash.  Neurological: Negative for dizziness, light-headedness and headaches.  Psychiatric/Behavioral: Negative for dysphoric mood and agitation.       Objective:     Blood pressure rechecked by me:  136/78  Physical Exam  Constitutional: She appears well-developed and well-nourished. No distress.  HENT:  Mouth/Throat: Oropharynx is clear and moist.  TMs clear without erythema.  No sinus tenderness.  Nares - slightly erythematous turbinates.    Eyes: Conjunctivae are normal. Right eye exhibits no discharge. Left eye exhibits no discharge.  Neck: Neck supple. No thyromegaly present.  Cardiovascular: Normal rate and regular rhythm.   Pulmonary/Chest: Breath sounds normal. No respiratory distress. She has no wheezes.  Abdominal: Soft. Bowel sounds are normal. There is no tenderness.  Musculoskeletal: She exhibits no edema or tenderness.  Lymphadenopathy:    She has no cervical adenopathy.  Skin: No rash noted. No erythema.  Psychiatric: She has a normal mood and affect. Her behavior is normal.    BP 110/80 mmHg  Pulse 72  Temp(Src) 98.2 F (36.8  C) (Oral)  Resp 18  Ht 5' 2.5" (1.588 m)  Wt 144 lb (65.318 kg)  BMI 25.90 kg/m2  SpO2 96% Wt Readings from Last 3 Encounters:  08/01/15 144 lb (65.318 kg)  06/17/15 145 lb (65.772 kg)  05/31/15 143 lb 8 oz (65.091 kg)     Lab Results  Component Value Date   WBC 6.8 04/12/2015   HGB 14.5 04/12/2015   HCT 43.0 04/12/2015   PLT 275.0 04/12/2015   GLUCOSE 88 04/12/2015   CHOL 206* 04/12/2015   TRIG 120.0 04/12/2015   HDL 38.50* 04/12/2015   LDLCALC 143* 04/12/2015   ALT 9 05/26/2015   AST 14 05/26/2015   NA 138 04/12/2015   K 3.8 04/12/2015   CL 103 04/12/2015   CREATININE 0.76 04/12/2015   BUN 9 04/12/2015   CO2 29 04/12/2015   TSH 0.73 04/12/2015       Assessment & Plan:   Problem List Items Addressed This Visit    Anxiety    On citalopram.  Doing well.  Continue current medication regimen.  Follow.       COUGH    Minimal cough and nasal congestion.  Exam as outlined.  Ear clear.  No sinus tenderness to palpation.  Saline nasal spray and robitussin as directed.  Follow.  Notify me if worsens.        Essential hypertension - Primary    Blood pressure as outlined.  Doing well off amlodipine.  Follow pressures.  Remain off amlodipine.  Follow metabolic panel.        Relevant Orders   Basic metabolic panel   Hypercholesterolemia    Low cholesterol diet and exercise.  On pravastatin.  Follow lipid panel and liver function tests.        Relevant Orders   Lipid panel   Hepatic function panel   Sleeping difficulties    No problems reported today.  Follow.       Vitamin D deficiency    Follow vitamin D level.       Relevant Orders   VITAMIN D 25 Hydroxy (Vit-D Deficiency, Fractures)       Dale , MD

## 2015-08-02 ENCOUNTER — Encounter: Payer: Self-pay | Admitting: Internal Medicine

## 2015-08-02 NOTE — Assessment & Plan Note (Signed)
Low cholesterol diet and exercise.  On pravastatin.  Follow lipid panel and liver function tests.   

## 2015-08-02 NOTE — Assessment & Plan Note (Signed)
Minimal cough and nasal congestion.  Exam as outlined.  Ear clear.  No sinus tenderness to palpation.  Saline nasal spray and robitussin as directed.  Follow.  Notify me if worsens.

## 2015-08-02 NOTE — Assessment & Plan Note (Signed)
Blood pressure as outlined.  Doing well off amlodipine.  Follow pressures.  Remain off amlodipine.  Follow metabolic panel.

## 2015-08-02 NOTE — Assessment & Plan Note (Signed)
Follow vitamin D level.  

## 2015-08-02 NOTE — Assessment & Plan Note (Signed)
No problems reported today.  Follow.   

## 2015-08-02 NOTE — Assessment & Plan Note (Signed)
On citalopram.  Doing well.  Continue current medication regimen.  Follow.

## 2015-08-06 ENCOUNTER — Other Ambulatory Visit: Payer: Self-pay | Admitting: Internal Medicine

## 2015-08-12 ENCOUNTER — Telehealth: Payer: Self-pay | Admitting: *Deleted

## 2015-08-12 NOTE — Telephone Encounter (Signed)
Patient has requested a antibiotic for a sinus infection. Patient was informed that a appt.may be needed for this. Please Advise

## 2015-08-12 NOTE — Telephone Encounter (Signed)
Patient said that if not better she would try to come in next week.

## 2015-08-12 NOTE — Telephone Encounter (Signed)
LM for patient to call back. Per Dr. Lorin PicketScott patient will need appointment.

## 2015-11-09 ENCOUNTER — Other Ambulatory Visit: Payer: Self-pay | Admitting: Internal Medicine

## 2015-11-09 NOTE — Telephone Encounter (Signed)
Refilled in October with 3 refills. Please advise?

## 2015-11-10 NOTE — Telephone Encounter (Signed)
ok'd refill for clonazepam #30 with one refill.   

## 2015-11-15 ENCOUNTER — Other Ambulatory Visit: Payer: Self-pay | Admitting: Internal Medicine

## 2015-11-30 ENCOUNTER — Other Ambulatory Visit (INDEPENDENT_AMBULATORY_CARE_PROVIDER_SITE_OTHER): Payer: BLUE CROSS/BLUE SHIELD

## 2015-11-30 DIAGNOSIS — I10 Essential (primary) hypertension: Secondary | ICD-10-CM | POA: Diagnosis not present

## 2015-11-30 DIAGNOSIS — E7800 Pure hypercholesterolemia, unspecified: Secondary | ICD-10-CM | POA: Diagnosis not present

## 2015-11-30 DIAGNOSIS — E559 Vitamin D deficiency, unspecified: Secondary | ICD-10-CM

## 2015-11-30 LAB — BASIC METABOLIC PANEL
BUN: 14 mg/dL (ref 6–23)
CALCIUM: 8.9 mg/dL (ref 8.4–10.5)
CO2: 29 meq/L (ref 19–32)
CREATININE: 0.86 mg/dL (ref 0.40–1.20)
Chloride: 104 mEq/L (ref 96–112)
GFR: 71.9 mL/min (ref >60.00)
GLUCOSE: 90 mg/dL (ref 70–99)
Potassium: 3.3 mEq/L — ABNORMAL LOW (ref 3.5–5.1)
SODIUM: 141 meq/L (ref 135–145)

## 2015-11-30 LAB — HEPATIC FUNCTION PANEL
ALK PHOS: 75 U/L (ref 39–117)
ALT: 10 U/L (ref 0–35)
AST: 13 U/L (ref 0–37)
Albumin: 3.7 g/dL (ref 3.5–5.2)
BILIRUBIN DIRECT: 0.1 mg/dL (ref 0.0–0.3)
Total Bilirubin: 0.5 mg/dL (ref 0.2–1.2)
Total Protein: 6.8 g/dL (ref 6.0–8.3)

## 2015-11-30 LAB — LIPID PANEL
CHOL/HDL RATIO: 6
Cholesterol: 199 mg/dL (ref 0–200)
HDL: 35.3 mg/dL — AB (ref >39.00)
LDL CALC: 145 mg/dL — AB (ref 0–99)
NonHDL: 163.44
TRIGLYCERIDES: 92 mg/dL (ref 0.0–149.0)
VLDL: 18.4 mg/dL (ref 0.0–40.0)

## 2015-11-30 LAB — VITAMIN D 25 HYDROXY (VIT D DEFICIENCY, FRACTURES): VITD: 41.15 ng/mL (ref 30.00–100.00)

## 2015-12-02 ENCOUNTER — Ambulatory Visit (INDEPENDENT_AMBULATORY_CARE_PROVIDER_SITE_OTHER): Payer: BLUE CROSS/BLUE SHIELD | Admitting: Internal Medicine

## 2015-12-02 ENCOUNTER — Encounter: Payer: Self-pay | Admitting: Internal Medicine

## 2015-12-02 VITALS — BP 120/80 | HR 83 | Temp 98.2°F | Resp 18 | Ht 62.5 in | Wt 149.1 lb

## 2015-12-02 DIAGNOSIS — I10 Essential (primary) hypertension: Secondary | ICD-10-CM

## 2015-12-02 DIAGNOSIS — Z0000 Encounter for general adult medical examination without abnormal findings: Secondary | ICD-10-CM

## 2015-12-02 DIAGNOSIS — E7800 Pure hypercholesterolemia, unspecified: Secondary | ICD-10-CM | POA: Diagnosis not present

## 2015-12-02 DIAGNOSIS — F419 Anxiety disorder, unspecified: Secondary | ICD-10-CM

## 2015-12-02 DIAGNOSIS — R0981 Nasal congestion: Secondary | ICD-10-CM

## 2015-12-02 DIAGNOSIS — G479 Sleep disorder, unspecified: Secondary | ICD-10-CM

## 2015-12-02 MED ORDER — HYDROXYZINE HCL 10 MG PO TABS: 10.0000 mg | ORAL_TABLET | Freq: Every evening | ORAL | Status: DC | PRN

## 2015-12-02 NOTE — Progress Notes (Signed)
Pre-visit discussion using our clinic review tool. No additional management support is needed unless otherwise documented below in the visit note.  

## 2015-12-02 NOTE — Progress Notes (Signed)
Patient ID: Kim Love, female   DOB: 1956-08-29, 60 y.o.   MRN: [id number]   Subjective:    Patient ID: Kim Love, female    DOB: [birth year], 60 y.o.   MRN: [id number]  HPI  Patient here for a scheduled follow up.  Increased stress recently.  Dealing with the death of a close friend and her father-n-law.  Also her mother-n-law is moving in with her.  Overall she feels she is handling things relatively well.  Takes clonazepam.  Feels at times that this makes her a little too sleepy midday.  We discussed other treatment options.  Tries to stay active.  Reports no cardiac symptoms with increased activity or exertion.  No sob.  No acid reflux.  No abdominal pain or cramping.  Bowels stable.  Some nasal congestion.    Past Medical History  Diagnosis Date  . Hypertension   . Hypercholesterolemia   . Anxiety    Past Surgical History  Procedure Laterality Date  . Tonsillectomy and adenoidectomy Bilateral 1980   Family History  Problem Relation Age of Onset  . Stroke Maternal Grandmother    Social History   Social History  . Marital Status: Married    Spouse Name: N/A  . Number of Children: 1  . Years of Education: N/A   Occupational History  .  [employer]   Social History Main Topics  . Smoking status: Former Smoker    Types: Cigarettes    Quit date: 09/10/2012  . Smokeless tobacco: Current User  . Alcohol Use: No  . Drug Use: No  . Sexual Activity: Not Asked   Other Topics Concern  . None   Social History Narrative    Outpatient Encounter Prescriptions as of 12/02/2015  Medication Sig  . cetirizine (ZYRTEC) 10 MG tablet Take 1 tablet (10 mg total) by mouth daily.  . citalopram (CELEXA) 20 MG tablet TAKE ONE (1) TABLET BY MOUTH EVERY DAY  . losartan-hydrochlorothiazide (HYZAAR) 100-25 MG tablet TAKE ONE (1) TABLET BY MOUTH EVERY DAY  . mometasone (NASONEX) 50 MCG/ACT nasal spray Place 2 sprays into the nose daily.  . pravastatin (PRAVACHOL) 10 MG tablet TAKE ONE  (1) TABLET EACH DAY  . [DISCONTINUED] clonazePAM (KLONOPIN) 0.5 MG tablet TAKE ONE-HALF TO 1 TABLET AT BEDTIME  . hydrOXYzine (ATARAX/VISTARIL) 10 MG tablet Take 1 tablet (10 mg total) by mouth at bedtime as needed.   No facility-administered encounter medications on file as of 12/02/2015.    Review of Systems  Constitutional: Positive for fatigue. Negative for appetite change and unexpected weight change.  HENT: Negative for congestion and sinus pressure.   Respiratory: Negative for cough, chest tightness and shortness of breath.   Cardiovascular: Negative for chest pain, palpitations and leg swelling.  Gastrointestinal: Negative for nausea, vomiting, abdominal pain and diarrhea.  Genitourinary: Negative for dysuria and difficulty urinating.  Musculoskeletal: Negative for back pain and joint swelling.  Skin: Negative for color change and rash.  Neurological: Negative for dizziness, light-headedness and headaches.  Psychiatric/Behavioral: Positive for sleep disturbance. Negative for dysphoric mood and agitation.       Objective:    Physical Exam  Constitutional: She appears well-developed and well-nourished. No distress.  HENT:  Nose: Nose normal.  Mouth/Throat: Oropharynx is clear and moist.  Neck: Neck supple. No thyromegaly present.  Cardiovascular: Normal rate and regular rhythm.   Pulmonary/Chest: Breath sounds normal. No respiratory distress. She has no wheezes.  Abdominal: Soft. Bowel sounds are normal. There is  no tenderness.  Musculoskeletal: She exhibits no edema or tenderness.  Lymphadenopathy:    She has no cervical adenopathy.  Skin: No rash noted. No erythema.  Psychiatric: She has a normal mood and affect. Her behavior is normal.    BP 120/80 mmHg  Pulse 83  Temp(Src) 98.2 F (36.8 C) (Oral)  Resp 18  Ht 5' 2.5" (1.588 m)  Wt 149 lb 2 oz (67.643 kg)  BMI 26.82 kg/m2  SpO2 97% Wt Readings from Last 3 Encounters:  12/02/15 149 lb 2 oz (67.643 kg)    08/01/15 144 lb (65.318 kg)  06/17/15 145 lb (65.772 kg)     Lab Results  Component Value Date   WBC 6.8 04/12/2015   HGB 14.5 04/12/2015   HCT 43.0 04/12/2015   PLT 275.0 04/12/2015   GLUCOSE 90 11/30/2015   CHOL 199 11/30/2015   TRIG 92.0 11/30/2015   HDL 35.30* 11/30/2015   LDLCALC 145* 11/30/2015   ALT 10 11/30/2015   AST 13 11/30/2015   NA 141 11/30/2015   K 3.3* 11/30/2015   CL 104 11/30/2015   CREATININE 0.86 11/30/2015   BUN 14 11/30/2015   CO2 29 11/30/2015   TSH 0.73 04/12/2015       Assessment & Plan:   Problem List Items Addressed This Visit    Anxiety    Increased stress recently.  Appears to be doing well on citalopram.  Follow.  Change to vistaril to help her sleep.  Follow.        Relevant Medications   hydrOXYzine (ATARAX/VISTARIL) 10 MG tablet   Essential hypertension - Primary    Blood pressure under good control.  Continue same medication regimen.  Follow pressures.  Follow metabolic panel.        Health care maintenance    Sees Dr Malva LimesMark Anderson.  PAP 08/09/14 - negative.  Mammogram - states just had.  Need results.  States ok.        Hypercholesterolemia    On pravastatin.  Low cholesterol diet and exercise.  Follow lipid panel and liver function tests.        Nasal congestion    Saline nasal spray and steroid nasal spray as directed.  Takes zyrtec.  Follow.        Sleeping difficulties    Discussed at length with her today.  Clonazepam can make her sleepy midday.  Change to vistaril.  Follow.            Dale DurhamSCOTT, Alease Fait, MD

## 2015-12-02 NOTE — Patient Instructions (Signed)
Saline nasal spray - flush nose at least 2-3x/day  nasonex - 2 sprays each nostril one time per day.  Do this in the evening.

## 2015-12-04 ENCOUNTER — Encounter: Payer: Self-pay | Admitting: Internal Medicine

## 2015-12-04 NOTE — Assessment & Plan Note (Signed)
Discussed at length with her today.  Clonazepam can make her sleepy midday.  Change to vistaril.  Follow.

## 2015-12-04 NOTE — Assessment & Plan Note (Signed)
Blood pressure under good control.  Continue same medication regimen.  Follow pressures.  Follow metabolic panel.   

## 2015-12-04 NOTE — Assessment & Plan Note (Signed)
Increased stress recently.  Appears to be doing well on citalopram.  Follow.  Change to vistaril to help her sleep.  Follow.

## 2015-12-04 NOTE — Assessment & Plan Note (Signed)
Sees Dr Malva LimesMark Anderson.  PAP 08/09/14 - negative.  Mammogram - states just had.  Need results.  States ok.

## 2015-12-04 NOTE — Assessment & Plan Note (Signed)
Saline nasal spray and steroid nasal spray as directed.  Takes zyrtec.  Follow.

## 2015-12-04 NOTE — Assessment & Plan Note (Signed)
On pravastatin.  Low cholesterol diet and exercise.  Follow lipid panel and liver function tests.   

## 2016-01-17 ENCOUNTER — Telehealth: Payer: Self-pay | Admitting: Internal Medicine

## 2016-01-17 NOTE — Telephone Encounter (Signed)
Pt pulled back. She wants to know it she could take tramadol. She has some at home.

## 2016-01-17 NOTE — Telephone Encounter (Signed)
Patient was sitting on the edge of the tub and stretch her lower muscle back muscle wrong picking up her clothes.  Feels like she pulled a muscle.   She has Tramadol at home 50mg  tablets that her husband had(they are within the expiration period)  Please advise if she can use them?

## 2016-01-17 NOTE — Telephone Encounter (Signed)
Given that she is on citalopram, I would not recommend taking tramadol.  Can interact with each other.  Can take tylenol extra strength (2 tablets q 8 hours prn).  If persistent pain, can evaluate.

## 2016-01-17 NOTE — Telephone Encounter (Signed)
Spoke with the patient and advised against the tramadol.  She verbalized understanding. thanks

## 2016-02-02 ENCOUNTER — Telehealth: Payer: Self-pay | Admitting: Internal Medicine

## 2016-02-02 NOTE — Telephone Encounter (Signed)
If hurt back, needs to be evaluated and then can determine what medication needed.  I will not be here tomorrow and we are closed Monday.  Recommend going ahead with evaluation.  Can go to acute care or see if someone has opening here.  Thanks.

## 2016-02-02 NOTE — Telephone Encounter (Signed)
Pt called about muscle pulled in her neck about 4-5 days ago. Pt has been taking Tylenol but not going away. Pt also pulled back out on Sunday. Pt wants to know if she can take Flexireel muscle relaxer?   Call pt @ [phone]. Thank you!

## 2016-02-02 NOTE — Telephone Encounter (Signed)
Patient is aware and will call back tomorrow to schedule an appointment if no improvement.

## 2016-02-08 ENCOUNTER — Encounter: Payer: Self-pay | Admitting: Internal Medicine

## 2016-02-08 ENCOUNTER — Ambulatory Visit (INDEPENDENT_AMBULATORY_CARE_PROVIDER_SITE_OTHER): Payer: BLUE CROSS/BLUE SHIELD | Admitting: Internal Medicine

## 2016-02-08 VITALS — BP 142/100 | HR 84 | Temp 98.1°F | Resp 18 | Ht 62.5 in | Wt 149.0 lb

## 2016-02-08 DIAGNOSIS — F419 Anxiety disorder, unspecified: Secondary | ICD-10-CM

## 2016-02-08 DIAGNOSIS — E7800 Pure hypercholesterolemia, unspecified: Secondary | ICD-10-CM | POA: Diagnosis not present

## 2016-02-08 DIAGNOSIS — I10 Essential (primary) hypertension: Secondary | ICD-10-CM | POA: Diagnosis not present

## 2016-02-08 DIAGNOSIS — G479 Sleep disorder, unspecified: Secondary | ICD-10-CM

## 2016-02-08 MED ORDER — CYCLOBENZAPRINE HCL 5 MG PO TABS: 5.0000 mg | ORAL_TABLET | Freq: Every evening | ORAL | Status: DC | PRN

## 2016-02-08 NOTE — Progress Notes (Signed)
Pre-visit discussion using our clinic review tool. No additional management support is needed unless otherwise documented below in the visit note.  

## 2016-02-08 NOTE — Progress Notes (Signed)
Patient ID: Kim Love, female   DOB: 08-18-1956, 60 y.o.   MRN: [id number]   Subjective:    Patient ID: Kim Love, female    DOB: 08-18-1956, 60 y.o.   MRN: [id number]  HPI  Patient here for a scheduled follow up.  She is accompanied by her husband.  History obtained from both of them.  She has been under increased stress recently.  Increased family stress.  Taking citalopram.  Not sleeping.  She also reports increased neck and shoulder discomfort.  Feels tense.  No relaxing.  Taking tylenol.  Has taken flexeril previously and tolerated.  Breathing stable.  Eating and drinking.  Blood pressure has been doing well.     Past Medical History  Diagnosis Date  . Hypertension   . Hypercholesterolemia   . Anxiety    Past Surgical History  Procedure Laterality Date  . Tonsillectomy and adenoidectomy Bilateral 1980   Family History  Problem Relation Age of Onset  . Stroke Maternal Grandmother    Social History   Social History  . Marital Status: Married    Spouse Name: N/A  . Number of Children: 1  . Years of Education: N/A   Occupational History  .  [employer]   Social History Main Topics  . Smoking status: Former Smoker    Types: Cigarettes    Quit date: 09/10/2012  . Smokeless tobacco: Current User  . Alcohol Use: No  . Drug Use: No  . Sexual Activity: Not Asked   Other Topics Concern  . None   Social History Narrative    Outpatient Encounter Prescriptions as of 02/08/2016  Medication Sig  . cetirizine (ZYRTEC) 10 MG tablet Take 1 tablet (10 mg total) by mouth daily. (Patient taking differently: Take 10 mg by mouth daily as needed. )  . Cholecalciferol (VITAMIN D-3 PO) Take by mouth.  . citalopram (CELEXA) 20 MG tablet TAKE ONE (1) TABLET BY MOUTH EVERY DAY  . losartan-hydrochlorothiazide (HYZAAR) 100-25 MG tablet TAKE ONE (1) TABLET BY MOUTH EVERY DAY  . mometasone (NASONEX) 50 MCG/ACT nasal spray Place 2 sprays into the nose daily. (Patient taking  differently: Place 2 sprays into the nose as needed. )  . pravastatin (PRAVACHOL) 10 MG tablet TAKE ONE (1) TABLET EACH DAY  . [DISCONTINUED] hydrOXYzine (ATARAX/VISTARIL) 10 MG tablet Take 1 tablet (10 mg total) by mouth at bedtime as needed.  . cyclobenzaprine (FLEXERIL) 5 MG tablet Take 1 tablet (5 mg total) by mouth at bedtime as needed for muscle spasms.   No facility-administered encounter medications on file as of 02/08/2016.    Review of Systems  Constitutional: Negative for appetite change and unexpected weight change.  HENT: Negative for congestion and sinus pressure.   Respiratory: Negative for cough, chest tightness and shortness of breath.   Cardiovascular: Negative for chest pain, palpitations and leg swelling.  Gastrointestinal: Negative for nausea, vomiting, abdominal pain and diarrhea.  Genitourinary: Negative for dysuria and difficulty urinating.  Musculoskeletal:       Neck and shoulder pain and tension as outlined.    Skin: Negative for color change and rash.  Neurological: Negative for dizziness, light-headedness and headaches.  Psychiatric/Behavioral: Positive for sleep disturbance.       Increased stress.         Objective:    Physical Exam  Constitutional: She appears well-developed and well-nourished. No distress.  HENT:  Nose: Nose normal.  Mouth/Throat: Oropharynx is clear and moist.  Neck: Neck supple. No  thyromegaly present.  Cardiovascular: Normal rate and regular rhythm.   Pulmonary/Chest: Breath sounds normal. No respiratory distress. She has no wheezes.  Abdominal: Soft. Bowel sounds are normal. There is no tenderness.  Musculoskeletal: She exhibits no edema or tenderness.  Increased discomfort to palpation - posterior shoulders.  Some pulling sensation - rotations of head.  Lymphadenopathy:    She has no cervical adenopathy.  Psychiatric: She has a normal mood and affect. Her behavior is normal.    BP 142/100 mmHg  Pulse 84  Temp(Src) 98.1  F (36.7 C) (Oral)  Resp 18  Ht 5' 2.5" (1.588 m)  Wt 149 lb (67.586 kg)  BMI 26.80 kg/m2  SpO2 98% Wt Readings from Last 3 Encounters:  02/08/16 149 lb (67.586 kg)  12/02/15 149 lb 2 oz (67.643 kg)  08/01/15 144 lb (65.318 kg)     Lab Results  Component Value Date   WBC 6.8 04/12/2015   HGB 14.5 04/12/2015   HCT 43.0 04/12/2015   PLT 275.0 04/12/2015   GLUCOSE 90 11/30/2015   CHOL 199 11/30/2015   TRIG 92.0 11/30/2015   HDL 35.30* 11/30/2015   LDLCALC 145* 11/30/2015   ALT 10 11/30/2015   AST 13 11/30/2015   NA 141 11/30/2015   K 3.3* 11/30/2015   CL 104 11/30/2015   CREATININE 0.86 11/30/2015   BUN 14 11/30/2015   CO2 29 11/30/2015   TSH 0.73 04/12/2015       Assessment & Plan:   Problem List Items Addressed This Visit    Anxiety    On citalopram.  Not sleeping well.  Discussed with her today.  She feels citalopram is working well.  Needs something to help her sleep.  Increased tension and stress in her neck.  Will try flexeril as directed.  Follow closely.        Essential hypertension - Primary    Blood pressure under good control.  Continue same medication regimen.  Follow pressures.  Follow metabolic panel.        Hypercholesterolemia    On pravastatin.  Low cholesterol diet and exercise.  Follow lipid panel and liver function tests.        Sleeping difficulties    Off hydroxyzine.  Flexeril as directed.  Follow.            Dale Ottosen, MD

## 2016-02-12 ENCOUNTER — Encounter: Payer: Self-pay | Admitting: Internal Medicine

## 2016-02-12 NOTE — Assessment & Plan Note (Signed)
Blood pressure under good control.  Continue same medication regimen.  Follow pressures.  Follow metabolic panel.   

## 2016-02-12 NOTE — Assessment & Plan Note (Signed)
Off hydroxyzine.  Flexeril as directed.  Follow.

## 2016-02-12 NOTE — Assessment & Plan Note (Signed)
On pravastatin.  Low cholesterol diet and exercise.  Follow lipid panel and liver function tests.   

## 2016-02-12 NOTE — Assessment & Plan Note (Signed)
On citalopram.  Not sleeping well.  Discussed with her today.  She feels citalopram is working well.  Needs something to help her sleep.  Increased tension and stress in her neck.  Will try flexeril as directed.  Follow closely.

## 2016-03-12 ENCOUNTER — Ambulatory Visit (INDEPENDENT_AMBULATORY_CARE_PROVIDER_SITE_OTHER): Payer: BLUE CROSS/BLUE SHIELD | Admitting: Family Medicine

## 2016-03-12 ENCOUNTER — Encounter: Payer: Self-pay | Admitting: Family Medicine

## 2016-03-12 VITALS — BP 144/82 | HR 64 | Temp 98.5°F | Wt 150.4 lb

## 2016-03-12 DIAGNOSIS — J0100 Acute maxillary sinusitis, unspecified: Secondary | ICD-10-CM | POA: Insufficient documentation

## 2016-03-12 MED ORDER — AMOXICILLIN-POT CLAVULANATE 875-125 MG PO TABS: 1.0000 | ORAL_TABLET | Freq: Two times a day (BID) | ORAL | Status: DC

## 2016-03-12 NOTE — Patient Instructions (Signed)

## 2016-03-12 NOTE — Assessment & Plan Note (Signed)
No acute problem. Given duration of symptoms and recent worsening, treating empirically for sinusitis with Augmentin.

## 2016-03-12 NOTE — Progress Notes (Signed)
Pre visit review using our clinic review tool, if applicable. No additional management support is needed unless otherwise documented below in the visit note. 

## 2016-03-12 NOTE — Progress Notes (Signed)
   Subjective:  Patient ID: Loralee PacasJudy B Doeden, female    DOB: [birth year]  Age: 60 y.o. MRN: [id number]  CC: Sinusitis  HPI:  60 year old female presents to the clinic today for an acute visit with complaints of sinus infection.  Patient reports a 2 to 3 week history of sore throat, postnasal drip, sinus congestion, and pressure. Worsening of her symptoms last night. No associated fevers or chills. She has been taking Advil and using warm compresses without significant improvement. No known exacerbating factors. No other reported symptoms. No other complaints this time.  Social Hx   Social History   Social History  . Marital Status: Married    Spouse Name: N/A  . Number of Children: 1  . Years of Education: N/A   Occupational History  .  [employer]   Social History Main Topics  . Smoking status: Former Smoker    Types: Cigarettes    Quit date: 09/10/2012  . Smokeless tobacco: Current User  . Alcohol Use: No  . Drug Use: No  . Sexual Activity: Not Asked   Other Topics Concern  . None   Social History Narrative   Review of Systems  Constitutional: Negative.   HENT: Positive for congestion, sinus pressure and sore throat.    Objective:  BP 144/82 mmHg  Pulse 64  Temp(Src) 98.5 F (36.9 C) (Oral)  Wt 150 lb 6.4 oz (68.221 kg)  SpO2 98%  BP/Weight 03/12/2016 02/08/2016 12/02/2015  Systolic BP 144 142 120  Diastolic BP 82 100 80  Wt. (Lbs) 150.4 149 149.13  BMI 27.05 26.8 26.82   Physical Exam  Constitutional: She is oriented to person, place, and time. She appears well-developed. No distress.  HENT:  Head: Normocephalic and atraumatic.  Normal TMs bilaterally. Oropharynx clear. Right maxillary sinus tenderness to palpation.  Cardiovascular: Normal rate and regular rhythm.   Pulmonary/Chest: Effort normal. She has no wheezes. She has no rales.  Neurological: She is alert and oriented to person, place, and time.  Vitals reviewed.  Lab Results  Component Value  Date   WBC 6.8 04/12/2015   HGB 14.5 04/12/2015   HCT 43.0 04/12/2015   PLT 275.0 04/12/2015   GLUCOSE 90 11/30/2015   CHOL 199 11/30/2015   TRIG 92.0 11/30/2015   HDL 35.30* 11/30/2015   LDLCALC 145* 11/30/2015   ALT 10 11/30/2015   AST 13 11/30/2015   NA 141 11/30/2015   K 3.3* 11/30/2015   CL 104 11/30/2015   CREATININE 0.86 11/30/2015   BUN 14 11/30/2015   CO2 29 11/30/2015   TSH 0.73 04/12/2015   Assessment & Plan:   Problem List Items Addressed This Visit    Acute maxillary sinusitis - Primary    No acute problem. Given duration of symptoms and recent worsening, treating empirically for sinusitis with Augmentin.      Relevant Medications   amoxicillin-clavulanate (AUGMENTIN) 875-125 MG tablet     Meds ordered this encounter  Medications  . amoxicillin-clavulanate (AUGMENTIN) 875-125 MG tablet    Sig: Take 1 tablet by mouth 2 (two) times daily.    Dispense:  20 tablet    Refill:  0   Follow-up: PRN  Everlene OtherJayce Hargis Vandyne DO [facility]

## 2016-04-16 ENCOUNTER — Ambulatory Visit (INDEPENDENT_AMBULATORY_CARE_PROVIDER_SITE_OTHER): Payer: BLUE CROSS/BLUE SHIELD | Admitting: Internal Medicine

## 2016-04-16 ENCOUNTER — Encounter: Payer: Self-pay | Admitting: Internal Medicine

## 2016-04-16 ENCOUNTER — Encounter (INDEPENDENT_AMBULATORY_CARE_PROVIDER_SITE_OTHER): Payer: Self-pay

## 2016-04-16 DIAGNOSIS — I10 Essential (primary) hypertension: Secondary | ICD-10-CM

## 2016-04-16 DIAGNOSIS — E559 Vitamin D deficiency, unspecified: Secondary | ICD-10-CM | POA: Diagnosis not present

## 2016-04-16 DIAGNOSIS — E7800 Pure hypercholesterolemia, unspecified: Secondary | ICD-10-CM

## 2016-04-16 DIAGNOSIS — F419 Anxiety disorder, unspecified: Secondary | ICD-10-CM

## 2016-04-16 NOTE — Progress Notes (Signed)
Pre visit review using our clinic review tool, if applicable. No additional management support is needed unless otherwise documented below in the visit note. 

## 2016-04-16 NOTE — Progress Notes (Signed)
Patient ID: Kim Love, female   DOB: [birth year], 60 y.o.   MRN: [id number]   Subjective:    Patient ID: Kim Love, female    DOB: 11-Aug-1956, 60 y.o.   MRN: [id number]  HPI  Patient here for a scheduled follow up.  She is accompanied by her husband.  History obtained from both of them.  She is doing better.  Stress is better.  Overall feels she is handling things relatively well.  No chest pain.  No sob.  No abdominal pain or cramping.  Bowels stable.  Blood pressure has been doing well.  Neck is better.  Pain resolved.     Past Medical History:  Diagnosis Date  . Anxiety   . Hypercholesterolemia   . Hypertension    Past Surgical History:  Procedure Laterality Date  . TONSILLECTOMY AND ADENOIDECTOMY Bilateral 1980   Family History  Problem Relation Age of Onset  . Stroke Maternal Grandmother    Social History   Social History  . Marital status: Married    Spouse name: N/A  . Number of children: 1  . Years of education: N/A   Occupational History  .  [employer]   Social History Main Topics  . Smoking status: Former Smoker    Types: Cigarettes    Quit date: 09/10/2012  . Smokeless tobacco: Current User  . Alcohol use No  . Drug use: No  . Sexual activity: Not Asked   Other Topics Concern  . None   Social History Narrative  . None    Outpatient Encounter Prescriptions as of 04/16/2016  Medication Sig  . Cholecalciferol (VITAMIN D-3 PO) Take by mouth.  . citalopram (CELEXA) 20 MG tablet TAKE ONE (1) TABLET BY MOUTH EVERY DAY  . losartan-hydrochlorothiazide (HYZAAR) 100-25 MG tablet TAKE ONE (1) TABLET BY MOUTH EVERY DAY  . [DISCONTINUED] amoxicillin-clavulanate (AUGMENTIN) 875-125 MG tablet Take 1 tablet by mouth 2 (two) times daily.   No facility-administered encounter medications on file as of 04/16/2016.     Review of Systems  Constitutional: Negative for appetite change and unexpected weight change.  HENT: Negative for congestion and sinus pressure.     Respiratory: Negative for cough, chest tightness and shortness of breath.   Cardiovascular: Negative for chest pain, palpitations and leg swelling.  Gastrointestinal: Negative for abdominal pain, diarrhea, nausea and vomiting.  Musculoskeletal: Negative for joint swelling.       No neck pain.  Resolved.   Skin: Negative for color change and rash.  Neurological: Negative for dizziness, light-headedness and headaches.  Psychiatric/Behavioral: Negative for agitation and dysphoric mood.       Stress is better.        Objective:    Physical Exam  Constitutional: She appears well-developed and well-nourished. No distress.  HENT:  Nose: Nose normal.  Mouth/Throat: Oropharynx is clear and moist.  Neck: Neck supple. No thyromegaly present.  Cardiovascular: Normal rate and regular rhythm.   Pulmonary/Chest: Breath sounds normal. No respiratory distress. She has no wheezes.  Abdominal: Soft. Bowel sounds are normal. There is no tenderness.  Musculoskeletal: She exhibits no edema or tenderness.  Lymphadenopathy:    She has no cervical adenopathy.  Skin: No rash noted. No erythema.  Psychiatric: She has a normal mood and affect. Her behavior is normal.    BP 122/74   Pulse 76   Temp 98.8 F (37.1 C)   Resp 12   Wt 145 lb (65.8 kg)   BMI 26.10 kg/m  Wt Readings from Last 3 Encounters:  04/16/16 145 lb (65.8 kg)  03/12/16 150 lb 6.4 oz (68.2 kg)  02/08/16 149 lb (67.6 kg)     Lab Results  Component Value Date   WBC 6.8 04/12/2015   HGB 14.5 04/12/2015   HCT 43.0 04/12/2015   PLT 275.0 04/12/2015   GLUCOSE 90 11/30/2015   CHOL 199 11/30/2015   TRIG 92.0 11/30/2015   HDL 35.30 (L) 11/30/2015   LDLCALC 145 (H) 11/30/2015   ALT 10 11/30/2015   AST 13 11/30/2015   NA 141 11/30/2015   K 3.3 (L) 11/30/2015   CL 104 11/30/2015   CREATININE 0.86 11/30/2015   BUN 14 11/30/2015   CO2 29 11/30/2015   TSH 0.73 04/12/2015       Assessment & Plan:   Problem List Items  Addressed This Visit    Anxiety    Stress is better.  On citalopram.  Doing better.  Follow.       Essential hypertension    Blood pressure under good control.  Continue same medication regimen.  Follow pressures.  Follow metabolic panel.        Relevant Orders   CBC with Differential/Platelet   TSH   Basic metabolic panel   Hypercholesterolemia    Low cholesterol diet and exercise.  Follow lipid panel.        Relevant Orders   Lipid panel   Hepatic function panel   Vitamin D deficiency    Continue vitamin D supplements.  Follow.        Other Visit Diagnoses   None.      Dale DurhamSCOTT, Glenroy Crossen, MD

## 2016-04-17 ENCOUNTER — Encounter: Payer: Self-pay | Admitting: Internal Medicine

## 2016-04-17 NOTE — Assessment & Plan Note (Signed)
Low cholesterol diet and exercise.  Follow lipid panel.   

## 2016-04-17 NOTE — Assessment & Plan Note (Signed)
Blood pressure under good control.  Continue same medication regimen.  Follow pressures.  Follow metabolic panel.   

## 2016-04-17 NOTE — Assessment & Plan Note (Signed)
Stress is better.  On citalopram.  Doing better.  Follow.  

## 2016-04-17 NOTE — Assessment & Plan Note (Signed)
Continue vitamin D supplements.  Follow.   

## 2016-04-23 ENCOUNTER — Other Ambulatory Visit: Payer: Self-pay | Admitting: Internal Medicine

## 2016-06-14 ENCOUNTER — Ambulatory Visit (INDEPENDENT_AMBULATORY_CARE_PROVIDER_SITE_OTHER): Payer: BLUE CROSS/BLUE SHIELD

## 2016-06-14 DIAGNOSIS — Z23 Encounter for immunization: Secondary | ICD-10-CM | POA: Diagnosis not present

## 2016-06-14 NOTE — Progress Notes (Signed)
Patient received flu shot 

## 2016-06-20 ENCOUNTER — Other Ambulatory Visit (INDEPENDENT_AMBULATORY_CARE_PROVIDER_SITE_OTHER): Payer: BLUE CROSS/BLUE SHIELD

## 2016-06-20 DIAGNOSIS — I10 Essential (primary) hypertension: Secondary | ICD-10-CM | POA: Diagnosis not present

## 2016-06-20 DIAGNOSIS — E7800 Pure hypercholesterolemia, unspecified: Secondary | ICD-10-CM

## 2016-06-20 LAB — LIPID PANEL
CHOLESTEROL: 217 mg/dL — AB (ref 0–200)
HDL: 40.5 mg/dL (ref >39.00)
LDL Cholesterol: 155 mg/dL — ABNORMAL HIGH (ref 0–99)
NonHDL: 176.29
TRIGLYCERIDES: 106 mg/dL (ref 0.0–149.0)
Total CHOL/HDL Ratio: 5
VLDL: 21.2 mg/dL (ref 0.0–40.0)

## 2016-06-20 LAB — CBC WITH DIFFERENTIAL/PLATELET
Basophils Absolute: 0 10*3/uL (ref 0.0–0.1)
Basophils Relative: 0.6 % (ref 0.0–3.0)
EOS PCT: 4.4 % (ref 0.0–5.0)
Eosinophils Absolute: 0.3 10*3/uL (ref 0.0–0.7)
HCT: 43.2 % (ref 36.0–46.0)
HEMOGLOBIN: 14.6 g/dL (ref 12.0–15.0)
Lymphocytes Relative: 20.9 % (ref 12.0–46.0)
Lymphs Abs: 1.4 10*3/uL (ref 0.7–4.0)
MCHC: 33.8 g/dL (ref 30.0–36.0)
MCV: 90.1 fl (ref 78.0–100.0)
MONO ABS: 0.3 10*3/uL (ref 0.1–1.0)
MONOS PCT: 5.1 % (ref 3.0–12.0)
Neutro Abs: 4.5 10*3/uL (ref 1.4–7.7)
Neutrophils Relative %: 69 % (ref 43.0–77.0)
Platelets: 287 10*3/uL (ref 150.0–400.0)
RBC: 4.8 Mil/uL (ref 3.87–5.11)
RDW: 13.7 % (ref 11.5–15.5)
WBC: 6.6 10*3/uL (ref 4.0–10.5)

## 2016-06-20 LAB — BASIC METABOLIC PANEL
BUN: 11 mg/dL (ref 6–23)
CHLORIDE: 102 meq/L (ref 96–112)
CO2: 31 meq/L (ref 19–32)
Calcium: 9.1 mg/dL (ref 8.4–10.5)
Creatinine, Ser: 0.86 mg/dL (ref 0.40–1.20)
GFR: 71.76 mL/min (ref >60.00)
Glucose, Bld: 97 mg/dL (ref 70–99)
POTASSIUM: 3.7 meq/L (ref 3.5–5.1)
SODIUM: 140 meq/L (ref 135–145)

## 2016-06-20 LAB — HEPATIC FUNCTION PANEL
ALBUMIN: 3.8 g/dL (ref 3.5–5.2)
ALK PHOS: 83 U/L (ref 39–117)
ALT: 11 U/L (ref 0–35)
AST: 13 U/L (ref 0–37)
Bilirubin, Direct: 0 mg/dL (ref 0.0–0.3)
TOTAL PROTEIN: 7.2 g/dL (ref 6.0–8.3)
Total Bilirubin: 0.5 mg/dL (ref 0.2–1.2)

## 2016-06-20 LAB — TSH: TSH: 1 u[IU]/mL (ref 0.35–4.50)

## 2016-06-21 ENCOUNTER — Telehealth: Payer: Self-pay

## 2016-06-21 MED ORDER — ROSUVASTATIN CALCIUM 5 MG PO TABS: 5.0000 mg | ORAL_TABLET | ORAL | 3 refills | Status: DC

## 2016-06-21 NOTE — Telephone Encounter (Signed)
-----   Message from Dale Durhamharlene Scott, MD sent at 06/21/2016  4:35 AM EDT ----- Notify pt that her cholesterol has increased some from the previous checks.  Given persistent increased cholesterol levels, I recommend starting low dose cholesterol medication.  Would like to start crestor 5mg  q Monday, Wednesday and Friday.  Will need liver panel checked  6 weeks after starting cholesterol medication.  Hgb, thyroid test, kidney function tests and liver function tests are wnl.

## 2016-06-22 ENCOUNTER — Other Ambulatory Visit: Payer: Self-pay | Admitting: Internal Medicine

## 2016-06-22 DIAGNOSIS — E7800 Pure hypercholesterolemia, unspecified: Secondary | ICD-10-CM

## 2016-06-22 NOTE — Progress Notes (Signed)
Order placed for f/u liver panel.  

## 2016-07-27 ENCOUNTER — Telehealth: Payer: Self-pay | Admitting: *Deleted

## 2016-07-27 NOTE — Telephone Encounter (Signed)
Patient requested a phone consult on a OTC medication she can take for a head cold. Patient has high blood pressure Pt contact [phone]

## 2016-07-27 NOTE — Telephone Encounter (Signed)
Patient notified to try Nasacort and robitussin. Patient will try this. Patient states she did not start the Crestor because she would like to try altering her diet one last time. Mailed low cholesterol diet handout.

## 2016-08-06 ENCOUNTER — Other Ambulatory Visit: Payer: Self-pay

## 2016-08-22 ENCOUNTER — Encounter: Payer: Self-pay | Admitting: Internal Medicine

## 2016-08-22 ENCOUNTER — Ambulatory Visit (INDEPENDENT_AMBULATORY_CARE_PROVIDER_SITE_OTHER): Payer: BLUE CROSS/BLUE SHIELD | Admitting: Internal Medicine

## 2016-08-22 DIAGNOSIS — I10 Essential (primary) hypertension: Secondary | ICD-10-CM

## 2016-08-22 DIAGNOSIS — E559 Vitamin D deficiency, unspecified: Secondary | ICD-10-CM

## 2016-08-22 DIAGNOSIS — F419 Anxiety disorder, unspecified: Secondary | ICD-10-CM

## 2016-08-22 DIAGNOSIS — E7800 Pure hypercholesterolemia, unspecified: Secondary | ICD-10-CM

## 2016-08-22 DIAGNOSIS — G479 Sleep disorder, unspecified: Secondary | ICD-10-CM | POA: Diagnosis not present

## 2016-08-22 NOTE — Progress Notes (Signed)
Patient ID: Kim Love, female   DOB: 11/28/1955, 60 y.o.   MRN: [id number]   Subjective:    Patient ID: Kim PacasJudy B Love, female    DOB: 11/28/1955, 60 y.o.   MRN: [id number]  HPI  Patient here for a scheduled follow up.  Increased stress.  Keeping her grandchildren.  Has them a lot.  Overall she feels she is handling things relatively well.  No chest pain.  No sob.  No acid reflux.  No abdominal pain or cramping.  Bowels stable.  Stopped crestor.  Intolerance.     Past Medical History:  Diagnosis Date  . Anxiety   . Hypercholesterolemia   . Hypertension    Past Surgical History:  Procedure Laterality Date  . TONSILLECTOMY AND ADENOIDECTOMY Bilateral 1980   Family History  Problem Relation Age of Onset  . Stroke Maternal Grandmother    Social History   Social History  . Marital status: Married    Spouse name: N/A  . Number of children: 1  . Years of education: N/A   Occupational History  .  [employer]   Social History Main Topics  . Smoking status: Former Smoker    Types: Cigarettes    Quit date: 09/10/2012  . Smokeless tobacco: Current User  . Alcohol use No  . Drug use: No  . Sexual activity: Not Asked   Other Topics Concern  . None   Social History Narrative  . None    Outpatient Encounter Prescriptions as of 08/22/2016  Medication Sig  . Cholecalciferol (VITAMIN D-3 PO) Take by mouth.  . citalopram (CELEXA) 20 MG tablet TAKE ONE (1) TABLET EACH DAY  . losartan-hydrochlorothiazide (HYZAAR) 100-25 MG tablet TAKE ONE (1) TABLET EACH DAY  . [DISCONTINUED] rosuvastatin (CRESTOR) 5 MG tablet Take 1 tablet (5 mg total) by mouth every Monday, Wednesday, and Friday. (Patient not taking: Reported on 08/22/2016)   No facility-administered encounter medications on file as of 08/22/2016.     Review of Systems  Constitutional: Negative for appetite change and unexpected weight change.  HENT: Negative for congestion and sinus pressure.   Respiratory: Negative for  cough, chest tightness and shortness of breath.   Cardiovascular: Negative for chest pain, palpitations and leg swelling.  Gastrointestinal: Negative for abdominal pain, diarrhea, nausea and vomiting.  Genitourinary: Negative for difficulty urinating and dysuria.  Musculoskeletal: Negative for back pain and joint swelling.  Skin: Negative for color change and rash.  Neurological: Negative for dizziness, light-headedness and headaches.  Psychiatric/Behavioral: Negative for dysphoric mood.       Increased stress as outlined.         Objective:     Blood pressure rechecked by me:  138/82  Physical Exam  Constitutional: She appears well-developed and well-nourished. No distress.  HENT:  Nose: Nose normal.  Mouth/Throat: Oropharynx is clear and moist.  Neck: Neck supple. No thyromegaly present.  Cardiovascular: Normal rate and regular rhythm.   Pulmonary/Chest: Breath sounds normal. No respiratory distress. She has no wheezes.  Abdominal: Soft. Bowel sounds are normal. There is no tenderness.  Musculoskeletal: She exhibits no edema or tenderness.  Lymphadenopathy:    She has no cervical adenopathy.  Skin: No rash noted. No erythema.  Psychiatric: She has a normal mood and affect. Her behavior is normal.    BP 138/82   Pulse 72   Wt 145 lb (65.8 kg)   SpO2 98%   BMI 26.10 kg/m  Wt Readings from Last 3 Encounters:  08/22/16  145 lb (65.8 kg)  04/16/16 145 lb (65.8 kg)  03/12/16 150 lb 6.4 oz (68.2 kg)     Lab Results  Component Value Date   WBC 6.6 06/20/2016   HGB 14.6 06/20/2016   HCT 43.2 06/20/2016   PLT 287.0 06/20/2016   GLUCOSE 97 06/20/2016   CHOL 217 (H) 06/20/2016   TRIG 106.0 06/20/2016   HDL 40.50 06/20/2016   LDLCALC 155 (H) 06/20/2016   ALT 11 06/20/2016   AST 13 06/20/2016   NA 140 06/20/2016   K 3.7 06/20/2016   CL 102 06/20/2016   CREATININE 0.86 06/20/2016   BUN 11 06/20/2016   CO2 31 06/20/2016   TSH 1.00 06/20/2016       Assessment &  Plan:   Problem List Items Addressed This Visit    Anxiety    On citalopram.  Discussed her increased stress.  She does not feel needs anything more at this time.  Follow.        Essential hypertension    Blood pressure as outlined.  Continue same medication regimen.  Follow pressures.  Follow metabolic panel.         Relevant Orders   Basic metabolic panel   Hypercholesterolemia    Intolerance to crestor.  Low cholesterol diet and exercise.  Follow lipid panel.        Relevant Orders   Hepatic function panel   Lipid panel   Sleeping difficulties    Sleeping better.  Follow.       Vitamin D deficiency    Continue vitamin D supplements.  Follow vitamin D level.           Dale DurhamSCOTT, Kamara Allan, MD

## 2016-09-05 ENCOUNTER — Encounter: Payer: Self-pay | Admitting: Internal Medicine

## 2016-09-05 NOTE — Assessment & Plan Note (Signed)
On citalopram.  Discussed her increased stress.  She does not feel needs anything more at this time.  Follow.

## 2016-09-05 NOTE — Assessment & Plan Note (Signed)
Sleeping better.  Follow.  

## 2016-09-05 NOTE — Assessment & Plan Note (Signed)
Continue vitamin D supplements.  Follow vitamin D level.   

## 2016-09-05 NOTE — Assessment & Plan Note (Signed)
Intolerance to crestor.  Low cholesterol diet and exercise.  Follow lipid panel.

## 2016-09-05 NOTE — Assessment & Plan Note (Signed)
Blood pressure as outlined.  Continue same medication regimen.  Follow pressures.  Follow metabolic panel.  

## 2016-09-19 ENCOUNTER — Telehealth: Payer: Self-pay | Admitting: Internal Medicine

## 2016-09-19 NOTE — Telephone Encounter (Signed)
Pt called c/o of ear pain and clogged, head congestion, and drainage. Pt's 504 month old granddaughter has RSV and does not want to keep spreading this around. No available appts. Please advise, thank you!  Call pt @ [phone]

## 2016-09-19 NOTE — Telephone Encounter (Signed)
Spoke with patient advised that we had no appointments available today.  Appointment scheduled for tomorrow.

## 2016-09-20 ENCOUNTER — Encounter: Payer: Self-pay | Admitting: Family Medicine

## 2016-09-20 ENCOUNTER — Ambulatory Visit (INDEPENDENT_AMBULATORY_CARE_PROVIDER_SITE_OTHER): Payer: BLUE CROSS/BLUE SHIELD | Admitting: Family Medicine

## 2016-09-20 DIAGNOSIS — J0100 Acute maxillary sinusitis, unspecified: Secondary | ICD-10-CM

## 2016-09-20 MED ORDER — AMOXICILLIN-POT CLAVULANATE 875-125 MG PO TABS: 1.0000 | ORAL_TABLET | Freq: Two times a day (BID) | ORAL | 0 refills | Status: DC

## 2016-09-20 NOTE — Progress Notes (Signed)
Pre visit review using our clinic review tool, if applicable. No additional management support is needed unless otherwise documented below in the visit note. 

## 2016-09-20 NOTE — Progress Notes (Signed)
  Marikay AlarEric Aleyda Gindlesperger, MD Phone: [phone]  Kim Love is a 61 y.o. female who presents today for same-day visit.  Patient presents with maxillary sinus pressure on the right side for the last week. Also frontal sinus pressure. Teeth hurt as well. Postnasal drip. Sore throat. Ear fullness. Does feel somewhat clammy. Felt feverish though did not check her temperature. Has been taking some Tylenol with some benefit for the sore throat. Does note several sick contacts at home.  PMH: Former smoker.  ROS see history of present illness  Objective  Physical Exam Vitals:   09/20/16 0952  BP: 138/90  Pulse: 79  Temp: 98.5 F (36.9 C)    BP Readings from Last 3 Encounters:  09/20/16 138/90  09/05/16 138/82  04/16/16 122/74   Wt Readings from Last 3 Encounters:  09/20/16 146 lb (66.2 kg)  08/22/16 145 lb (65.8 kg)  04/16/16 145 lb (65.8 kg)    Physical Exam  Constitutional: She is well-developed, well-nourished, and in no distress.  HENT:  Head: Normocephalic and atraumatic.  Mouth posterior oropharyngeal erythema, no exudates, normal TMs bilaterally, right maxillary sinus tenderness to percussion  Eyes: Conjunctivae are normal. Pupils are equal, round, and reactive to light.  Neck: Neck supple.  Cardiovascular: Normal rate, regular rhythm and normal heart sounds.   Pulmonary/Chest: Effort normal and breath sounds normal.  Lymphadenopathy:    She has no cervical adenopathy.     Assessment/Plan: Please see individual problem list.  Acute maxillary sinusitis New acute issue. Given duration and persistent symptoms we'll treat for sinusitis with Augmentin. Did discuss adding Claritin and Nasonex to see if these could be beneficial. If not improving in the next 3-4 days she'll follow-up in the office.   No orders of the defined types were placed in this encounter.   Meds ordered this encounter  Medications  . amoxicillin-clavulanate (AUGMENTIN) 875-125 MG tablet    Sig:  Take 1 tablet by mouth 2 (two) times daily.    Dispense:  14 tablet    Refill:  0   Marikay AlarEric Kelley Knoth, MD [facility]

## 2016-09-20 NOTE — Assessment & Plan Note (Signed)
New acute issue. Given duration and persistent symptoms we'll treat for sinusitis with Augmentin. Did discuss adding Claritin and Nasonex to see if these could be beneficial. If not improving in the next 3-4 days she'll follow-up in the office.

## 2016-09-20 NOTE — Patient Instructions (Signed)
Nice to see you. You likely have a sinus infection. We'll treat with Augmentin. You can try adding Claritin and Nasonex to this as well. If you develop fevers, or worsening symptoms please seek medical attention.

## 2016-09-25 ENCOUNTER — Telehealth: Payer: Self-pay | Admitting: Internal Medicine

## 2016-09-25 NOTE — Telephone Encounter (Signed)
Pt called and wanted to know if Dr. Lorin PicketScott would take her daughter on as a new patient. Please advise, thank you!  Call pt @ [phone]

## 2016-09-25 NOTE — Telephone Encounter (Signed)
ok 

## 2016-09-25 NOTE — Telephone Encounter (Signed)
Spoke with patient and her daughter is Delora FuelLauren Lowe DOB 08/25/1984  MRN [id number]. She is not having any urgent concerns at this time. I informed patient that if Dr. Lorin PicketScott will take patient it would be several months out. She was fine with this.

## 2016-09-28 NOTE — Telephone Encounter (Signed)
Lm for pt to call and schedule appt.

## 2016-10-26 ENCOUNTER — Other Ambulatory Visit (INDEPENDENT_AMBULATORY_CARE_PROVIDER_SITE_OTHER): Payer: BLUE CROSS/BLUE SHIELD

## 2016-10-26 DIAGNOSIS — E7800 Pure hypercholesterolemia, unspecified: Secondary | ICD-10-CM

## 2016-10-26 DIAGNOSIS — I10 Essential (primary) hypertension: Secondary | ICD-10-CM

## 2016-10-26 LAB — BASIC METABOLIC PANEL
BUN: 11 mg/dL (ref 6–23)
CALCIUM: 8.9 mg/dL (ref 8.4–10.5)
CO2: 29 mEq/L (ref 19–32)
CREATININE: 0.76 mg/dL (ref 0.40–1.20)
Chloride: 105 mEq/L (ref 96–112)
GFR: 82.67 mL/min (ref >60.00)
Glucose, Bld: 90 mg/dL (ref 70–99)
Potassium: 3.6 mEq/L (ref 3.5–5.1)
Sodium: 139 mEq/L (ref 135–145)

## 2016-10-26 LAB — HEPATIC FUNCTION PANEL
ALBUMIN: 4.1 g/dL (ref 3.5–5.2)
ALT: 10 U/L (ref 0–35)
AST: 13 U/L (ref 0–37)
Alkaline Phosphatase: 85 U/L (ref 39–117)
BILIRUBIN TOTAL: 0.7 mg/dL (ref 0.2–1.2)
Bilirubin, Direct: 0.1 mg/dL (ref 0.0–0.3)
TOTAL PROTEIN: 6.7 g/dL (ref 6.0–8.3)

## 2016-10-26 LAB — LIPID PANEL
CHOL/HDL RATIO: 5
Cholesterol: 213 mg/dL — ABNORMAL HIGH (ref 0–200)
HDL: 38.8 mg/dL — AB (ref >39.00)
LDL Cholesterol: 154 mg/dL — ABNORMAL HIGH (ref 0–99)
NONHDL: 173.97
Triglycerides: 102 mg/dL (ref 0.0–149.0)
VLDL: 20.4 mg/dL (ref 0.0–40.0)

## 2016-10-31 ENCOUNTER — Ambulatory Visit (INDEPENDENT_AMBULATORY_CARE_PROVIDER_SITE_OTHER): Payer: BLUE CROSS/BLUE SHIELD | Admitting: Internal Medicine

## 2016-10-31 ENCOUNTER — Encounter: Payer: Self-pay | Admitting: Internal Medicine

## 2016-10-31 DIAGNOSIS — I10 Essential (primary) hypertension: Secondary | ICD-10-CM | POA: Diagnosis not present

## 2016-10-31 DIAGNOSIS — E7800 Pure hypercholesterolemia, unspecified: Secondary | ICD-10-CM

## 2016-10-31 DIAGNOSIS — F419 Anxiety disorder, unspecified: Secondary | ICD-10-CM

## 2016-10-31 MED ORDER — LOSARTAN POTASSIUM-HCTZ 100-25 MG PO TABS: ORAL_TABLET | ORAL | 1 refills | Status: DC

## 2016-10-31 MED ORDER — CITALOPRAM HYDROBROMIDE 20 MG PO TABS: ORAL_TABLET | ORAL | 1 refills | Status: DC

## 2016-10-31 NOTE — Progress Notes (Signed)
Patient ID: Kim Love, female   DOB: [birth year], 61 y.o.   MRN: [id number]   Subjective:    Patient ID: Kim PacasJudy B Love, female    DOB: [birth year], 61 y.o.   MRN: [id number]  HPI  Patient here for a scheduled follow up.  She reports she is doing relatively well.  Increased stress.  Discussed with her today.  Overall she feels she is handling things relatively well.  On citalopram.  Does not feel needs any further intervention.  No chest pain.  No sob.  No nausea or vomiting.  Bowels stable.  Blood pressures vary.  States she checks some after she has been very active.  A little higher then.     Past Medical History:  Diagnosis Date  . Anxiety   . Hypercholesterolemia   . Hypertension    Past Surgical History:  Procedure Laterality Date  . TONSILLECTOMY AND ADENOIDECTOMY Bilateral 1980   Family History  Problem Relation Age of Onset  . Stroke Maternal Grandmother    Social History   Social History  . Marital status: Married    Spouse name: N/A  . Number of children: 1  . Years of education: N/A   Occupational History  .  [employer]   Social History Main Topics  . Smoking status: Former Smoker    Types: Cigarettes    Quit date: 09/10/2012  . Smokeless tobacco: Current User  . Alcohol use No  . Drug use: No  . Sexual activity: Not Asked   Other Topics Concern  . None   Social History Narrative  . None    Outpatient Encounter Prescriptions as of 10/31/2016  Medication Sig  . Cholecalciferol (VITAMIN D-3 PO) Take by mouth.  . citalopram (CELEXA) 20 MG tablet TAKE ONE (1) TABLET EACH DAY  . losartan-hydrochlorothiazide (HYZAAR) 100-25 MG tablet TAKE ONE (1) TABLET EACH DAY  . [DISCONTINUED] citalopram (CELEXA) 20 MG tablet TAKE ONE (1) TABLET EACH DAY  . [DISCONTINUED] losartan-hydrochlorothiazide (HYZAAR) 100-25 MG tablet TAKE ONE (1) TABLET EACH DAY  . [DISCONTINUED] amoxicillin-clavulanate (AUGMENTIN) 875-125 MG tablet Take 1 tablet by mouth 2 (two) times daily.  (Patient not taking: Reported on 10/31/2016)   No facility-administered encounter medications on file as of 10/31/2016.     Review of Systems  Constitutional: Negative for appetite change and unexpected weight change.  HENT: Negative for congestion and sinus pressure.   Respiratory: Negative for cough, chest tightness and shortness of breath.   Cardiovascular: Negative for chest pain, palpitations and leg swelling.  Gastrointestinal: Negative for abdominal pain, diarrhea, nausea and vomiting.  Genitourinary: Negative for difficulty urinating and dysuria.  Musculoskeletal: Negative for back pain and joint swelling.  Skin: Negative for color change and rash.  Neurological: Negative for dizziness, light-headedness and headaches.  Psychiatric/Behavioral: Negative for agitation and dysphoric mood.       Objective:    Physical Exam  Constitutional: She appears well-developed and well-nourished. No distress.  HENT:  Nose: Nose normal.  Mouth/Throat: Oropharynx is clear and moist.  Neck: Neck supple. No thyromegaly present.  Cardiovascular: Normal rate and regular rhythm.   Pulmonary/Chest: Breath sounds normal. No respiratory distress. She has no wheezes.  Abdominal: Soft. Bowel sounds are normal. There is no tenderness.  Musculoskeletal: She exhibits no edema or tenderness.  Lymphadenopathy:    She has no cervical adenopathy.  Skin: No rash noted. No erythema.  Psychiatric: She has a normal mood and affect. Her behavior is normal.    BP  136/82 (BP Location: Left Arm, Patient Position: Sitting, Cuff Size: Large)   Pulse 89   Temp 98.6 F (37 C) (Oral)   Resp 16   Ht 5\' 3"  (1.6 m)   Wt 144 lb 3.2 oz (65.4 kg)   SpO2 98%   BMI 25.54 kg/m  Wt Readings from Last 3 Encounters:  10/31/16 144 lb 3.2 oz (65.4 kg)  09/20/16 146 lb (66.2 kg)  08/22/16 145 lb (65.8 kg)     Lab Results  Component Value Date   WBC 6.6 06/20/2016   HGB 14.6 06/20/2016   HCT 43.2 06/20/2016   PLT  287.0 06/20/2016   GLUCOSE 90 10/26/2016   CHOL 213 (H) 10/26/2016   TRIG 102.0 10/26/2016   HDL 38.80 (L) 10/26/2016   LDLCALC 154 (H) 10/26/2016   ALT 10 10/26/2016   AST 13 10/26/2016   NA 139 10/26/2016   K 3.6 10/26/2016   CL 105 10/26/2016   CREATININE 0.76 10/26/2016   BUN 11 10/26/2016   CO2 29 10/26/2016   TSH 1.00 06/20/2016       Assessment & Plan:   Problem List Items Addressed This Visit    Anxiety    Still with increased stress.  Overall doing well on citalopram.  Does not feel needs any further intervention.  Follow.       Relevant Medications   citalopram (CELEXA) 20 MG tablet   Essential hypertension, benign    Blood pressure overall ok on current regimen.  Follow pressures.  Follow metabolic panel.        Relevant Medications   losartan-hydrochlorothiazide (HYZAAR) 100-25 MG tablet   Other Relevant Orders   Basic metabolic panel   Hypercholesterolemia    Low cholesterol diet and exercise.  Follow lipid panel.        Relevant Medications   losartan-hydrochlorothiazide (HYZAAR) 100-25 MG tablet   Other Relevant Orders   Hepatic function panel   Lipid panel       Dale [place], MD

## 2016-10-31 NOTE — Progress Notes (Signed)
Pre-visit discussion using our clinic review tool. No additional management support is needed unless otherwise documented below in the visit note.  

## 2016-11-05 ENCOUNTER — Encounter: Payer: Self-pay | Admitting: Internal Medicine

## 2016-11-05 NOTE — Assessment & Plan Note (Signed)
Low cholesterol diet and exercise.  Follow lipid panel.   

## 2016-11-05 NOTE — Assessment & Plan Note (Signed)
Still with increased stress.  Overall doing well on citalopram.  Does not feel needs any further intervention.  Follow.

## 2016-11-05 NOTE — Assessment & Plan Note (Signed)
Blood pressure overall ok on current regimen.  Follow pressures.  Follow metabolic panel.

## 2016-12-18 ENCOUNTER — Other Ambulatory Visit: Payer: Self-pay | Admitting: Internal Medicine

## 2016-12-18 ENCOUNTER — Encounter: Payer: Self-pay | Admitting: Family Medicine

## 2016-12-18 ENCOUNTER — Ambulatory Visit (INDEPENDENT_AMBULATORY_CARE_PROVIDER_SITE_OTHER): Payer: BLUE CROSS/BLUE SHIELD | Admitting: Family Medicine

## 2016-12-18 ENCOUNTER — Telehealth: Payer: Self-pay | Admitting: Internal Medicine

## 2016-12-18 DIAGNOSIS — L255 Unspecified contact dermatitis due to plants, except food: Secondary | ICD-10-CM | POA: Diagnosis not present

## 2016-12-18 MED ORDER — CLOBETASOL PROP EMOLLIENT BASE 0.05 % EX CREA: TOPICAL_CREAM | CUTANEOUS | 0 refills | Status: DC

## 2016-12-18 NOTE — Telephone Encounter (Signed)
Pt called and wanted to know if Dr. Lorin Picket could call in something for her poison oak. Pt states that she has had it for 3 days. She has been washing it with dial soap and using some OTC stuff but does not seem to be working. Please advise, thank you!  Call pt @ [phone]  Pharmacy - [facility] - [place], [place] - [address]

## 2016-12-18 NOTE — Progress Notes (Signed)
Pre visit review using our clinic review tool, if applicable. No additional management support is needed unless otherwise documented below in the visit note. 

## 2016-12-18 NOTE — Telephone Encounter (Signed)
pt called requesting a refill on citalopram (CELEXA) 20 MG tablet and losartan-hydrochlorothiazide (HYZAAR) 100-25 MG tablet. Please advise, thank you!  Pharmacy -  [facility] - [place], [place] - [address]

## 2016-12-18 NOTE — Progress Notes (Signed)
   Subjective:  Patient ID: Kim Love, female    DOB: 1956/02/08  Age: 61 y.o. MRN: [id number]  CC: Rash  HPI:  61 year old female presents for an acute visit with complaints of rash.  Patient reports she's had a rash on her upper extremities for the past 3 days. Pruritic. Started after working outdoors. She's been using some over-the-counter topical agents without improvement. No known exacerbating factors. She is concerned that she has poison oak/ivy. She would like to discuss treatment options today.  Social Hx   Social History   Social History  . Marital status: Married    Spouse name: N/A  . Number of children: 1  . Years of education: N/A   Occupational History  .  [employer]   Social History Main Topics  . Smoking status: Former Smoker    Types: Cigarettes    Quit date: 09/10/2012  . Smokeless tobacco: Current User  . Alcohol use No  . Drug use: No  . Sexual activity: Not Asked   Other Topics Concern  . None   Social History Narrative  . None    Review of Systems  Constitutional: Negative.   Skin: Positive for rash.   Objective:  BP 140/80   Pulse 79   Temp 98.2 F (36.8 C) (Oral)   Wt 146 lb 2 oz (66.3 kg)   SpO2 96%   BMI 25.88 kg/m   BP/Weight 12/18/2016 10/31/2016 09/20/2016  Systolic BP 140 136 138  Diastolic BP 80 82 90  Wt. (Lbs) 146.13 144.2 146  BMI 25.88 25.54 26.28    Physical Exam  Constitutional: She is oriented to person, place, and time. She appears well-developed. No distress.  Pulmonary/Chest: Effort normal.  Neurological: She is alert and oriented to person, place, and time.  Skin:  Upper extremities - scattered papular rash with evidence of excoriation. Most problematic area is located in the left antecubital fossa.  Psychiatric: She has a normal mood and affect.  Vitals reviewed.   Lab Results  Component Value Date   WBC 6.6 06/20/2016   HGB 14.6 06/20/2016   HCT 43.2 06/20/2016   PLT 287.0 06/20/2016   GLUCOSE  90 10/26/2016   CHOL 213 (H) 10/26/2016   TRIG 102.0 10/26/2016   HDL 38.80 (L) 10/26/2016   LDLCALC 154 (H) 10/26/2016   ALT 10 10/26/2016   AST 13 10/26/2016   NA 139 10/26/2016   K 3.6 10/26/2016   CL 105 10/26/2016   CREATININE 0.76 10/26/2016   BUN 11 10/26/2016   CO2 29 10/26/2016   TSH 1.00 06/20/2016    Assessment & Plan:   Problem List Items Addressed This Visit    Dermatitis due to plants, including poison ivy, sumac, and oak    New problem. Exam and history consistent with dermatitis secondary to poison oak/ivy. Given the small amount of area affected, treating with topical clobetasol. Patient also wanted to avoid side effects of systemic steroids.         Meds ordered this encounter  Medications  . Clobetasol Prop Emollient Base (CLOBETASOL PROPIONATE E) 0.05 % emollient cream    Sig: Twice daily to affected areas.    Dispense:  30 g    Refill:  0   Follow-up: PRN  Everlene Other DO [facility]

## 2016-12-18 NOTE — Assessment & Plan Note (Signed)
New problem. Exam and history consistent with dermatitis secondary to poison oak/ivy. Given the small amount of area affected, treating with topical clobetasol. Patient also wanted to avoid side effects of systemic steroids.

## 2016-12-18 NOTE — Telephone Encounter (Signed)
Appointment scheduled for 1:15 pm with Dr Adriana Simas

## 2016-12-18 NOTE — Patient Instructions (Signed)
Use as directed.  Call if you fail to improve.  Take care  Dr. Adriana Simas

## 2016-12-20 MED ORDER — CITALOPRAM HYDROBROMIDE 20 MG PO TABS: ORAL_TABLET | ORAL | 1 refills | Status: DC

## 2016-12-20 MED ORDER — LOSARTAN POTASSIUM-HCTZ 100-25 MG PO TABS: ORAL_TABLET | ORAL | 1 refills | Status: DC

## 2016-12-20 NOTE — Telephone Encounter (Signed)
rx sent, thanks.

## 2017-01-25 ENCOUNTER — Encounter: Payer: Self-pay | Admitting: Family Medicine

## 2017-01-25 ENCOUNTER — Ambulatory Visit (INDEPENDENT_AMBULATORY_CARE_PROVIDER_SITE_OTHER): Payer: BLUE CROSS/BLUE SHIELD | Admitting: Family Medicine

## 2017-01-25 DIAGNOSIS — J069 Acute upper respiratory infection, unspecified: Secondary | ICD-10-CM | POA: Diagnosis not present

## 2017-01-25 MED ORDER — LORATADINE 10 MG PO TABS: 10.0000 mg | ORAL_TABLET | Freq: Every day | ORAL | 11 refills | Status: DC

## 2017-01-25 MED ORDER — FLUTICASONE PROPIONATE 50 MCG/ACT NA SUSP: 2.0000 | Freq: Every day | NASAL | 6 refills | Status: AC

## 2017-01-25 MED ORDER — HYDROCODONE-HOMATROPINE 5-1.5 MG/5ML PO SYRP: 5.0000 mL | ORAL_SOLUTION | Freq: Three times a day (TID) | ORAL | 0 refills | Status: DC | PRN

## 2017-01-25 NOTE — Progress Notes (Signed)
  Kim Love Kemond Amorin, MD Phone: [phone]  Kim PacasJudy B Love is a 61 y.o. female who presents today for same-day visit.  Patient notes onset of symptoms 2 days ago. Has had sore throat with some postnasal drip and her ears feel congested and painful. Notes some congestion in her upper chest. Cough is nonproductive. Some mild headache. No vision changes. No shortness of breath. Tylenol has helped with the headache. She does have sick contacts with her 4777-month-old grandchild who is currently on antibiotics for an ear infection. Former smoker.   ROS see history of present illness  Objective  Physical Exam Vitals:   01/25/17 1302  BP: (!) 150/90  Pulse: 87  Temp: 98.6 F (37 C)    BP Readings from Last 3 Encounters:  01/25/17 (!) 150/90  12/18/16 140/80  10/31/16 136/82   Wt Readings from Last 3 Encounters:  01/25/17 144 lb 6.4 oz (65.5 kg)  12/18/16 146 lb 2 oz (66.3 kg)  10/31/16 144 lb 3.2 oz (65.4 kg)    Physical Exam  Constitutional: No distress.  HENT:  Head: Normocephalic and atraumatic.  Mouth/Throat: No oropharyngeal exudate.  Mild oropharynx erythema, no exudate, normal TMs bilaterally  Eyes: Conjunctivae are normal. Pupils are equal, round, and reactive to light.  Neck: Neck supple.  Cardiovascular: Normal rate, regular rhythm and normal heart sounds.   Pulmonary/Chest: Effort normal and breath sounds normal.  Lymphadenopathy:    She has no cervical adenopathy.  Neurological: She is alert. Gait normal.  Skin: She is not diaphoretic.     Assessment/Plan: Please see individual problem list.  Viral URI Symptoms consistent with viral upper respiratory infection. No focal findings to indicate bacterial illness. Discussed supportive care with Flonase and Claritin. Hycodan for cough. I did confirm that she has tolerated narcotics other than codeine in the past. Her allergy to codeine is gallbladder issues if she has too much of it. Advised if she developed any issues  related to the Hycodan she contact us. She's given return precautions.   No orders of the defined types were placed in this encounter.   Meds ordered this encounter  Medications  . fluticasone (FLONASE) 50 MCG/ACT nasal spray    Sig: Place 2 sprays into both nostrils daily.    Dispense:  16 g    Refill:  6  . loratadine (CLARITIN) 10 MG tablet    Sig: Take 1 tablet (10 mg total) by mouth daily.    Dispense:  30 tablet    Refill:  11  . HYDROcodone-homatropine (HYCODAN) 5-1.5 MG/5ML syrup    Sig: Take 5 mLs by mouth every 8 (eight) hours as needed for cough.    Dispense:  120 mL    Refill:  0    Kim Love Kim Placeres, MD [facility] [place]

## 2017-01-25 NOTE — Assessment & Plan Note (Signed)
Symptoms consistent with viral upper respiratory infection. No focal findings to indicate bacterial illness. Discussed supportive care with Flonase and Claritin. Hycodan for cough. I did confirm that she has tolerated narcotics other than codeine in the past. Her allergy to codeine is gallbladder issues if she has too much of it. Advised if she developed any issues related to the Hycodan she contact us. She's given return precautions.

## 2017-01-25 NOTE — Patient Instructions (Signed)
Nice to see you. We will treat you with Flonase and Claritin for likely viral illness. I have provided you with a prescription for Hycodan for your cough. This may make you drowsy so be careful. If you develop shortness of breath, cough productive of blood, fevers, worsening headaches, numbness, weakness, vision changes, or any new or change in symptoms please seek medical attention immediately.

## 2017-03-08 ENCOUNTER — Other Ambulatory Visit (INDEPENDENT_AMBULATORY_CARE_PROVIDER_SITE_OTHER): Payer: BLUE CROSS/BLUE SHIELD

## 2017-03-08 DIAGNOSIS — I10 Essential (primary) hypertension: Secondary | ICD-10-CM | POA: Diagnosis not present

## 2017-03-08 DIAGNOSIS — E7800 Pure hypercholesterolemia, unspecified: Secondary | ICD-10-CM | POA: Diagnosis not present

## 2017-03-08 LAB — LIPID PANEL
CHOL/HDL RATIO: 5
Cholesterol: 202 mg/dL — ABNORMAL HIGH (ref 0–200)
HDL: 37.2 mg/dL — AB (ref >39.00)
LDL Cholesterol: 136 mg/dL — ABNORMAL HIGH (ref 0–99)
NONHDL: 164.91
Triglycerides: 143 mg/dL (ref 0.0–149.0)
VLDL: 28.6 mg/dL (ref 0.0–40.0)

## 2017-03-08 LAB — BASIC METABOLIC PANEL
BUN: 9 mg/dL (ref 6–23)
CALCIUM: 9.4 mg/dL (ref 8.4–10.5)
CO2: 33 meq/L — AB (ref 19–32)
CREATININE: 0.89 mg/dL (ref 0.40–1.20)
Chloride: 102 mEq/L (ref 96–112)
GFR: 68.81 mL/min (ref >60.00)
Glucose, Bld: 93 mg/dL (ref 70–99)
Potassium: 3.5 mEq/L (ref 3.5–5.1)
SODIUM: 141 meq/L (ref 135–145)

## 2017-03-08 LAB — HEPATIC FUNCTION PANEL
ALBUMIN: 4.1 g/dL (ref 3.5–5.2)
ALK PHOS: 83 U/L (ref 39–117)
ALT: 11 U/L (ref 0–35)
AST: 14 U/L (ref 0–37)
BILIRUBIN DIRECT: 0.1 mg/dL (ref 0.0–0.3)
TOTAL PROTEIN: 7.3 g/dL (ref 6.0–8.3)
Total Bilirubin: 0.6 mg/dL (ref 0.2–1.2)

## 2017-03-11 ENCOUNTER — Ambulatory Visit (INDEPENDENT_AMBULATORY_CARE_PROVIDER_SITE_OTHER): Payer: BLUE CROSS/BLUE SHIELD | Admitting: Internal Medicine

## 2017-03-11 ENCOUNTER — Encounter: Payer: Self-pay | Admitting: Internal Medicine

## 2017-03-11 DIAGNOSIS — F419 Anxiety disorder, unspecified: Secondary | ICD-10-CM

## 2017-03-11 DIAGNOSIS — E559 Vitamin D deficiency, unspecified: Secondary | ICD-10-CM | POA: Diagnosis not present

## 2017-03-11 DIAGNOSIS — E7800 Pure hypercholesterolemia, unspecified: Secondary | ICD-10-CM | POA: Diagnosis not present

## 2017-03-11 DIAGNOSIS — I10 Essential (primary) hypertension: Secondary | ICD-10-CM

## 2017-03-11 NOTE — Progress Notes (Signed)
Pre-visit discussion using our clinic review tool. No additional management support is needed unless otherwise documented below in the visit note.  

## 2017-03-11 NOTE — Progress Notes (Signed)
Patient ID: Kim Love, female   DOB: Nov 16, 1955, 61 y.o.   MRN: [id number]   Subjective:    Patient ID: Kim Love, female    DOB: Jul 29, 1956, 61 y.o.   MRN: [id number]  HPI  Patient here for a scheduled follow up.  She has been under increased stress.  Discussed with her today.  Worse this week.  Has been following her blood pressures.  States under good control on outside checks.  Has not taken her medication today.  No chest pain.  No sob.  No acid reflux.  No abdominal pain.  Bowels moving.  Overall she feels she is handling things relatively well.  Does not feel needs any further intervention.  Discussed lab results.  Cholesterol has improved.     Past Medical History:  Diagnosis Date  . Anxiety   . Hypercholesterolemia   . Hypertension    Past Surgical History:  Procedure Laterality Date  . TONSILLECTOMY AND ADENOIDECTOMY Bilateral 1980   Family History  Problem Relation Age of Onset  . Stroke Maternal Grandmother    Social History   Social History  . Marital status: Married    Spouse name: N/A  . Number of children: 1  . Years of education: N/A   Occupational History  .  [employer]   Social History Main Topics  . Smoking status: Former Smoker    Types: Cigarettes    Quit date: 09/10/2012  . Smokeless tobacco: Current User  . Alcohol use No  . Drug use: No  . Sexual activity: Not Asked   Other Topics Concern  . None   Social History Narrative  . None    Outpatient Encounter Prescriptions as of 03/11/2017  Medication Sig  . Cholecalciferol (VITAMIN D-3 PO) Take by mouth.  . citalopram (CELEXA) 20 MG tablet TAKE ONE (1) TABLET EACH DAY  . Clobetasol Prop Emollient Base (CLOBETASOL PROPIONATE E) 0.05 % emollient cream Twice daily to affected areas.  . fluticasone (FLONASE) 50 MCG/ACT nasal spray Place 2 sprays into both nostrils daily.  Marland Kitchen loratadine (CLARITIN) 10 MG tablet Take 1 tablet (10 mg total) by mouth daily.  Marland Kitchen losartan-hydrochlorothiazide  (HYZAAR) 100-25 MG tablet TAKE ONE (1) TABLET EACH DAY  . [DISCONTINUED] HYDROcodone-homatropine (HYCODAN) 5-1.5 MG/5ML syrup Take 5 mLs by mouth every 8 (eight) hours as needed for cough.   No facility-administered encounter medications on file as of 03/11/2017.     Review of Systems  Constitutional: Negative for appetite change and unexpected weight change.  HENT: Negative for congestion and sinus pressure.   Respiratory: Negative for cough, chest tightness and shortness of breath.   Cardiovascular: Negative for chest pain, palpitations and leg swelling.  Gastrointestinal: Negative for abdominal pain, diarrhea, nausea and vomiting.  Genitourinary: Negative for difficulty urinating and dysuria.  Musculoskeletal: Negative for back pain and joint swelling.  Skin: Negative for color change and rash.  Neurological: Negative for dizziness, light-headedness and headaches.  Psychiatric/Behavioral: Negative for agitation.       Increased stress as outlined.         Objective:    Physical Exam  Constitutional: She appears well-developed and well-nourished. No distress.  HENT:  Nose: Nose normal.  Mouth/Throat: Oropharynx is clear and moist.  Neck: Neck supple. No thyromegaly present.  Cardiovascular: Normal rate and regular rhythm.   Pulmonary/Chest: Breath sounds normal. No respiratory distress. She has no wheezes.  Abdominal: Soft. Bowel sounds are normal. There is no tenderness.  Musculoskeletal: She exhibits  no edema or tenderness.  Lymphadenopathy:    She has no cervical adenopathy.  Skin: No rash noted. No erythema.  Psychiatric: She has a normal mood and affect. Her behavior is normal.    BP (!) 144/82 (BP Location: Left Arm, Patient Position: Sitting, Cuff Size: Normal)   Pulse 82   Temp 98.6 F (37 C) (Oral)   Resp 12   Ht 5\' 3"  (1.6 m)   Wt 145 lb 3.2 oz (65.9 kg)   SpO2 96%   BMI 25.72 kg/m  Wt Readings from Last 3 Encounters:  03/11/17 145 lb 3.2 oz (65.9 kg)    01/25/17 144 lb 6.4 oz (65.5 kg)  12/18/16 146 lb 2 oz (66.3 kg)     Lab Results  Component Value Date   WBC 6.6 06/20/2016   HGB 14.6 06/20/2016   HCT 43.2 06/20/2016   PLT 287.0 06/20/2016   GLUCOSE 93 03/08/2017   CHOL 202 (H) 03/08/2017   TRIG 143.0 03/08/2017   HDL 37.20 (L) 03/08/2017   LDLCALC 136 (H) 03/08/2017   ALT 11 03/08/2017   AST 14 03/08/2017   NA 141 03/08/2017   K 3.5 03/08/2017   CL 102 03/08/2017   CREATININE 0.89 03/08/2017   BUN 9 03/08/2017   CO2 33 (H) 03/08/2017   TSH 1.00 06/20/2016       Assessment & Plan:   Problem List Items Addressed This Visit    Anxiety    Increased stress as outlined.  On citalopram.  Discussed at length with her today.  Desires no further intervention at this time.  Follow.        Essential hypertension, benign    Blood pressure elevated today.  Discussed with her.  Increased stress as outlined.  She feels is contributing.  States did not take her medication today.  Have her spot check her pressure.  Get her back in soon to reassess.        Hypercholesterolemia    Recent cholesterol improved.  Discussed with her today.  She wants to continue diet and exercise.  Follow lipid panel.        Vitamin D deficiency    Continue vitamin D supplements.  Follow.            Dale DurhamSCOTT, Idil Maslanka, MD

## 2017-03-12 ENCOUNTER — Encounter: Payer: Self-pay | Admitting: Internal Medicine

## 2017-03-12 NOTE — Assessment & Plan Note (Signed)
Increased stress as outlined.  On citalopram.  Discussed at length with her today.  Desires no further intervention at this time.  Follow.

## 2017-03-12 NOTE — Assessment & Plan Note (Signed)
Recent cholesterol improved.  Discussed with her today.  She wants to continue diet and exercise.  Follow lipid panel.

## 2017-03-12 NOTE — Assessment & Plan Note (Signed)
Blood pressure elevated today.  Discussed with her.  Increased stress as outlined.  She feels is contributing.  States did not take her medication today.  Have her spot check her pressure.  Get her back in soon to reassess.

## 2017-03-12 NOTE — Assessment & Plan Note (Signed)
Continue vitamin D supplements.  Follow.   

## 2017-03-19 ENCOUNTER — Telehealth: Payer: Self-pay | Admitting: Internal Medicine

## 2017-03-19 MED ORDER — AMLODIPINE BESYLATE 5 MG PO TABS: 5.0000 mg | ORAL_TABLET | Freq: Every day | ORAL | 1 refills | Status: DC

## 2017-03-19 NOTE — Telephone Encounter (Signed)
Spoke to patient given all information meds called in. She will start and keep check on readings and let us know.

## 2017-03-19 NOTE — Telephone Encounter (Signed)
Please call pt at [phone]

## 2017-03-19 NOTE — Telephone Encounter (Signed)
Pt called and stated that her blood pressure is still running high. Pt states that her bp yesterday was 140/91 and today it was 170/90. Pt would like to know if Dr. Lorin PicketScott would like to start her on medication. Please advise, thank you!  [facility] [place] [address]  Call pt @ [phone]

## 2017-03-19 NOTE — Telephone Encounter (Signed)
Given persistent elevation, I would like to start amlodipine 5mg  q day.  Confirm that she had no problem taking amlodipine.  Have her spot check her pressure.  Send in readings.  Keep f/u appt.  If has any acute issues, needs to be seen earlier

## 2017-03-19 NOTE — Telephone Encounter (Signed)
Left message to return call to our office.  

## 2017-03-19 NOTE — Telephone Encounter (Signed)
Called back returning your call. Please advise, thank you!  Call pt @ [phone]

## 2017-03-19 NOTE — Telephone Encounter (Signed)
Called patient back she was seen by you on 03/11/17 and her bp was increased at that office visit. She has f/u on it on 04/10/17. Her b/p readings have been between 140/85 and up to 170/90. She has been checking mostly in am when she first gets up. She has not been having any changes in vision or head ache. She has noticed some increased fatigue over the last month and in the last few weeks she has been having a little trouble catching her breath while sitting around. No problems with sob with any activity. She was on amlodipine in the past and did not know if you wanted her to wait until follow up on 8/1 or call in something to start in addition to current bp meds.

## 2017-04-10 ENCOUNTER — Encounter: Payer: Self-pay | Admitting: Internal Medicine

## 2017-04-10 ENCOUNTER — Ambulatory Visit (INDEPENDENT_AMBULATORY_CARE_PROVIDER_SITE_OTHER): Payer: BLUE CROSS/BLUE SHIELD | Admitting: Internal Medicine

## 2017-04-10 DIAGNOSIS — E559 Vitamin D deficiency, unspecified: Secondary | ICD-10-CM | POA: Diagnosis not present

## 2017-04-10 DIAGNOSIS — I10 Essential (primary) hypertension: Secondary | ICD-10-CM

## 2017-04-10 DIAGNOSIS — F419 Anxiety disorder, unspecified: Secondary | ICD-10-CM | POA: Diagnosis not present

## 2017-04-10 DIAGNOSIS — E7800 Pure hypercholesterolemia, unspecified: Secondary | ICD-10-CM | POA: Diagnosis not present

## 2017-04-10 NOTE — Progress Notes (Signed)
Patient ID: Kim Love, female   DOB: 1956-01-17, 61 y.o.   MRN: [id number]   Subjective:    Patient ID: Kim PacasJudy B Love, female    DOB: 1956-01-17, 61 y.o.   MRN: [id number]  HPI  Patient here for a scheduled follow up.  Blood pressures doing better. She brings in outside readings and most blood pressures are averaging 114-130s/70-80s.  Tolerating medications.  Overall she feels well.  No chest pain.  No sob.  No acid reflux.  No abdominal pain.  Bowels moving.  No urine change.  Discussed her stress.  Increased stress, but she feels she is handling things relatively well.      Past Medical History:  Diagnosis Date  . Anxiety   . Hypercholesterolemia   . Hypertension    Past Surgical History:  Procedure Laterality Date  . TONSILLECTOMY AND ADENOIDECTOMY Bilateral 1980   Family History  Problem Relation Age of Onset  . Stroke Maternal Grandmother    Social History   Social History  . Marital status: Married    Spouse name: N/A  . Number of children: 1  . Years of education: N/A   Occupational History  .  [employer]   Social History Main Topics  . Smoking status: Former Smoker    Types: Cigarettes    Quit date: 09/10/2012  . Smokeless tobacco: Current User  . Alcohol use No  . Drug use: No  . Sexual activity: Not Asked   Other Topics Concern  . None   Social History Narrative  . None    Outpatient Encounter Prescriptions as of 04/10/2017  Medication Sig  . amLODipine (NORVASC) 5 MG tablet Take 1 tablet (5 mg total) by mouth daily.  . Cholecalciferol (VITAMIN D-3 PO) Take by mouth.  . citalopram (CELEXA) 20 MG tablet TAKE ONE (1) TABLET EACH DAY  . Clobetasol Prop Emollient Base (CLOBETASOL PROPIONATE E) 0.05 % emollient cream Twice daily to affected areas.  . fluticasone (FLONASE) 50 MCG/ACT nasal spray Place 2 sprays into both nostrils daily.  Marland Kitchen. loratadine (CLARITIN) 10 MG tablet Take 1 tablet (10 mg total) by mouth daily.  Marland Kitchen. losartan-hydrochlorothiazide  (HYZAAR) 100-25 MG tablet TAKE ONE (1) TABLET EACH DAY   No facility-administered encounter medications on file as of 04/10/2017.     Review of Systems  Constitutional: Negative for appetite change and unexpected weight change.  HENT: Negative for congestion and sinus pressure.   Respiratory: Negative for cough, chest tightness and shortness of breath.   Cardiovascular: Negative for chest pain, palpitations and leg swelling.  Gastrointestinal: Negative for abdominal pain, diarrhea, nausea and vomiting.  Genitourinary: Negative for difficulty urinating and dysuria.  Musculoskeletal: Negative for back pain and joint swelling.  Skin: Negative for color change and rash.  Neurological: Negative for dizziness, light-headedness and headaches.  Psychiatric/Behavioral: Negative for agitation and dysphoric mood.       Objective:    Physical Exam  Constitutional: She appears well-developed and well-nourished. No distress.  HENT:  Nose: Nose normal.  Mouth/Throat: Oropharynx is clear and moist.  Neck: Neck supple. No thyromegaly present.  Cardiovascular: Normal rate and regular rhythm.   Pulmonary/Chest: Breath sounds normal. No respiratory distress. She has no wheezes.  Abdominal: Soft. Bowel sounds are normal. There is no tenderness.  Musculoskeletal: She exhibits no edema or tenderness.  Lymphadenopathy:    She has no cervical adenopathy.  Skin: No rash noted. No erythema.  Psychiatric: She has a normal mood and affect. Her behavior  is normal.    BP 122/82   Pulse 78   Temp 97.7 F (36.5 C) (Oral)   Resp 16   Ht 5\' 3"  (1.6 m)   Wt 142 lb 12.8 oz (64.8 kg)   SpO2 98%   BMI 25.30 kg/m  Wt Readings from Last 3 Encounters:  04/10/17 142 lb 12.8 oz (64.8 kg)  03/11/17 145 lb 3.2 oz (65.9 kg)  01/25/17 144 lb 6.4 oz (65.5 kg)     Lab Results  Component Value Date   WBC 6.6 06/20/2016   HGB 14.6 06/20/2016   HCT 43.2 06/20/2016   PLT 287.0 06/20/2016   GLUCOSE 93 03/08/2017     CHOL 202 (H) 03/08/2017   TRIG 143.0 03/08/2017   HDL 37.20 (L) 03/08/2017   LDLCALC 136 (H) 03/08/2017   ALT 11 03/08/2017   AST 14 03/08/2017   NA 141 03/08/2017   K 3.5 03/08/2017   CL 102 03/08/2017   CREATININE 0.89 03/08/2017   BUN 9 03/08/2017   CO2 33 (H) 03/08/2017   TSH 1.00 06/20/2016       Assessment & Plan:   Problem List Items Addressed This Visit    Anxiety    Increased stress.  Doing better.  On citalopram.  Does not feel needs anything more at this time.  Follow.        Essential hypertension, benign    Blood pressure under good control.  Continue same medication regimen.  Follow pressures.  Follow metabolic panel.        Hypercholesterolemia    Low cholesterol diet and exercise.  Follow lipid panel.        Vitamin D deficiency    Continue vitamin D supplements.            Dale DurhamSCOTT, Naythen Heikkila, MD

## 2017-04-10 NOTE — Progress Notes (Signed)
Pre-visit discussion using our clinic review tool. No additional management support is needed unless otherwise documented below in the visit note.  

## 2017-04-13 ENCOUNTER — Encounter: Payer: Self-pay | Admitting: Internal Medicine

## 2017-04-13 NOTE — Assessment & Plan Note (Signed)
Blood pressure under good control.  Continue same medication regimen.  Follow pressures.  Follow metabolic panel.   

## 2017-04-13 NOTE — Assessment & Plan Note (Signed)
Increased stress.  Doing better.  On citalopram.  Does not feel needs anything more at this time.  Follow.

## 2017-04-13 NOTE — Assessment & Plan Note (Signed)
Low cholesterol diet and exercise.  Follow lipid panel.   

## 2017-04-13 NOTE — Assessment & Plan Note (Signed)
Continue vitamin D supplements.  

## 2017-05-07 ENCOUNTER — Other Ambulatory Visit: Payer: Self-pay | Admitting: Internal Medicine

## 2017-05-18 ENCOUNTER — Other Ambulatory Visit: Payer: Self-pay | Admitting: Internal Medicine

## 2017-06-02 ENCOUNTER — Other Ambulatory Visit: Payer: Self-pay | Admitting: Internal Medicine

## 2017-07-12 ENCOUNTER — Encounter: Payer: Self-pay | Admitting: Internal Medicine

## 2017-07-12 ENCOUNTER — Ambulatory Visit (INDEPENDENT_AMBULATORY_CARE_PROVIDER_SITE_OTHER): Payer: BLUE CROSS/BLUE SHIELD | Admitting: Internal Medicine

## 2017-07-12 DIAGNOSIS — E7800 Pure hypercholesterolemia, unspecified: Secondary | ICD-10-CM

## 2017-07-12 DIAGNOSIS — E559 Vitamin D deficiency, unspecified: Secondary | ICD-10-CM | POA: Diagnosis not present

## 2017-07-12 DIAGNOSIS — I10 Essential (primary) hypertension: Secondary | ICD-10-CM | POA: Diagnosis not present

## 2017-07-12 DIAGNOSIS — F419 Anxiety disorder, unspecified: Secondary | ICD-10-CM | POA: Diagnosis not present

## 2017-07-12 NOTE — Progress Notes (Signed)
Patient ID: Kim Love, female   DOB: [birth year], 61 y.o.   MRN: [id number]   Subjective:    Patient ID: Kim Love, female    DOB: [birth year], 61 y.o.   MRN: [id number]  HPI  Patient here for a scheduled follow up.  She reports she is doing well.  Dealing with increased stress.  Stress is some better.  She feels she is handling things well.  No chest pain.  No sob.  No acid reflux.  No abdominal pain. Bowels moving.  Blood pressure doing well.     Past Medical History:  Diagnosis Date  . Anxiety   . Hypercholesterolemia   . Hypertension    Past Surgical History:  Procedure Laterality Date  . TONSILLECTOMY AND ADENOIDECTOMY Bilateral 1980   Family History  Problem Relation Age of Onset  . Stroke Maternal Grandmother    Social History   Socioeconomic History  . Marital status: Married    Spouse name: None  . Number of children: 1  . Years of education: None  . Highest education level: None  Social Needs  . Financial resource strain: None  . Food insecurity - worry: None  . Food insecurity - inability: None  . Transportation needs - medical: None  . Transportation needs - non-medical: None  Occupational History    Employer: [employer]  Tobacco Use  . Smoking status: Former Smoker    Types: Cigarettes    Last attempt to quit: 09/10/2012    Years since quitting: 4.8  . Smokeless tobacco: Current User  Substance and Sexual Activity  . Alcohol use: No    Alcohol/week: 0.0 oz  . Drug use: No  . Sexual activity: None  Other Topics Concern  . None  Social History Narrative  . None    Outpatient Encounter Medications as of 07/12/2017  Medication Sig  . amLODipine (NORVASC) 5 MG tablet TAKE 1 TABLET BY MOUTH ONCE A DAY  . Cholecalciferol (VITAMIN D-3 PO) Take by mouth.  . citalopram (CELEXA) 20 MG tablet TAKE ONE (1) TABLET EACH DAY  . Clobetasol Prop Emollient Base (CLOBETASOL PROPIONATE E) 0.05 % emollient cream Twice daily to affected areas.  . fluticasone  (FLONASE) 50 MCG/ACT nasal spray Place 2 sprays into both nostrils daily.  Marland Kitchen. loratadine (CLARITIN) 10 MG tablet Take 1 tablet (10 mg total) by mouth daily.  Marland Kitchen. losartan-hydrochlorothiazide (HYZAAR) 100-25 MG tablet TAKE 1 TABLET BY MOUTH EVERY DAY  . [DISCONTINUED] citalopram (CELEXA) 20 MG tablet TAKE ONE (1) TABLET EACH DAY  . [DISCONTINUED] losartan-hydrochlorothiazide (HYZAAR) 100-25 MG tablet TAKE ONE (1) TABLET EACH DAY   No facility-administered encounter medications on file as of 07/12/2017.     Review of Systems  Constitutional: Negative for appetite change and unexpected weight change.  HENT: Negative for congestion and sinus pressure.   Respiratory: Negative for cough, chest tightness and shortness of breath.   Cardiovascular: Negative for chest pain, palpitations and leg swelling.  Gastrointestinal: Negative for abdominal pain, diarrhea, nausea and vomiting.  Genitourinary: Negative for difficulty urinating and dysuria.  Musculoskeletal: Negative for back pain and joint swelling.  Skin: Negative for color change and rash.  Neurological: Negative for dizziness, light-headedness and headaches.  Psychiatric/Behavioral: Negative for agitation and dysphoric mood.       Objective:    Physical Exam  Constitutional: She is oriented to person, place, and time. She appears well-developed and well-nourished. No distress.  HENT:  Nose: Nose normal.  Mouth/Throat: Oropharynx is clear  and moist.  Eyes: Right eye exhibits no discharge. Left eye exhibits no discharge. No scleral icterus.  Neck: Neck supple. No thyromegaly present.  Cardiovascular: Normal rate and regular rhythm.  Pulmonary/Chest: Breath sounds normal. No accessory muscle usage. No tachypnea. No respiratory distress. She has no decreased breath sounds. She has no wheezes. She has no rhonchi. Right breast exhibits no inverted nipple, no mass, no nipple discharge and no tenderness (no axillary adenopathy). Left breast  exhibits no inverted nipple, no mass, no nipple discharge and no tenderness (no axilarry adenopathy).  Abdominal: Soft. Bowel sounds are normal. There is no tenderness.  Musculoskeletal: She exhibits no edema or tenderness.  Lymphadenopathy:    She has no cervical adenopathy.  Neurological: She is alert and oriented to person, place, and time.  Skin: Skin is warm. No rash noted. No erythema.  Psychiatric: She has a normal mood and affect. Her behavior is normal.    BP 126/82 (BP Location: Left Arm, Patient Position: Sitting, Cuff Size: Normal)   Pulse 68   Temp 98.6 F (37 C) (Oral)   Resp 14   Ht 5\' 3"  (1.6 m)   Wt 144 lb 9.6 oz (65.6 kg)   SpO2 97%   BMI 25.61 kg/m  Wt Readings from Last 3 Encounters:  07/12/17 144 lb 9.6 oz (65.6 kg)  04/10/17 142 lb 12.8 oz (64.8 kg)  03/11/17 145 lb 3.2 oz (65.9 kg)     Lab Results  Component Value Date   WBC 6.6 06/20/2016   HGB 14.6 06/20/2016   HCT 43.2 06/20/2016   PLT 287.0 06/20/2016   GLUCOSE 93 03/08/2017   CHOL 202 (H) 03/08/2017   TRIG 143.0 03/08/2017   HDL 37.20 (L) 03/08/2017   LDLCALC 136 (H) 03/08/2017   ALT 11 03/08/2017   AST 14 03/08/2017   NA 141 03/08/2017   K 3.5 03/08/2017   CL 102 03/08/2017   CREATININE 0.89 03/08/2017   BUN 9 03/08/2017   CO2 33 (H) 03/08/2017   TSH 1.00 06/20/2016       Assessment & Plan:   Problem List Items Addressed This Visit    Anxiety    On citalopram.  Doing better.  Dealing with increased stress.  Follow.        Essential hypertension, benign    Blood pressure under good control.  Continue same medication regimen.  Follow pressures.  Follow metabolic panel.        Relevant Orders   CBC with Differential/Platelet   TSH   Basic metabolic panel   Hypercholesterolemia    Low cholesterol diet and exercise.  Follow lipid panel.        Relevant Orders   Hepatic function panel   Lipid panel   Vitamin D deficiency    Follow vitamin D level.        Relevant  Orders   VITAMIN D 25 Hydroxy (Vit-D Deficiency, Fractures)       Dale [place], MD

## 2017-07-14 ENCOUNTER — Encounter: Payer: Self-pay | Admitting: Internal Medicine

## 2017-07-14 NOTE — Assessment & Plan Note (Signed)
Blood pressure under good control.  Continue same medication regimen.  Follow pressures.  Follow metabolic panel.   

## 2017-07-14 NOTE — Assessment & Plan Note (Signed)
Low cholesterol diet and exercise.  Follow lipid panel.   

## 2017-07-14 NOTE — Assessment & Plan Note (Signed)
On citalopram.  Doing better.  Dealing with increased stress.  Follow.

## 2017-07-14 NOTE — Assessment & Plan Note (Signed)
Follow vitamin D level.  

## 2017-07-22 ENCOUNTER — Other Ambulatory Visit (INDEPENDENT_AMBULATORY_CARE_PROVIDER_SITE_OTHER): Payer: BLUE CROSS/BLUE SHIELD

## 2017-07-22 DIAGNOSIS — I10 Essential (primary) hypertension: Secondary | ICD-10-CM | POA: Diagnosis not present

## 2017-07-22 DIAGNOSIS — E559 Vitamin D deficiency, unspecified: Secondary | ICD-10-CM

## 2017-07-22 DIAGNOSIS — E7800 Pure hypercholesterolemia, unspecified: Secondary | ICD-10-CM | POA: Diagnosis not present

## 2017-07-22 LAB — BASIC METABOLIC PANEL
BUN: 8 mg/dL (ref 6–23)
CALCIUM: 9.1 mg/dL (ref 8.4–10.5)
CHLORIDE: 103 meq/L (ref 96–112)
CO2: 30 meq/L (ref 19–32)
CREATININE: 0.82 mg/dL (ref 0.40–1.20)
GFR: 75.54 mL/min (ref >60.00)
Glucose, Bld: 97 mg/dL (ref 70–99)
Potassium: 4 mEq/L (ref 3.5–5.1)
SODIUM: 143 meq/L (ref 135–145)

## 2017-07-22 LAB — CBC WITH DIFFERENTIAL/PLATELET
BASOS PCT: 0.9 % (ref 0.0–3.0)
Basophils Absolute: 0.1 10*3/uL (ref 0.0–0.1)
EOS PCT: 5.1 % — AB (ref 0.0–5.0)
Eosinophils Absolute: 0.3 10*3/uL (ref 0.0–0.7)
HEMATOCRIT: 40.2 % (ref 36.0–46.0)
Hemoglobin: 13.7 g/dL (ref 12.0–15.0)
LYMPHS ABS: 1.5 10*3/uL (ref 0.7–4.0)
Lymphocytes Relative: 22.6 % (ref 12.0–46.0)
MCHC: 34 g/dL (ref 30.0–36.0)
MCV: 92.6 fl (ref 78.0–100.0)
Monocytes Absolute: 0.4 10*3/uL (ref 0.1–1.0)
Monocytes Relative: 6.1 % (ref 3.0–12.0)
NEUTROS ABS: 4.3 10*3/uL (ref 1.4–7.7)
Neutrophils Relative %: 65.3 % (ref 43.0–77.0)
PLATELETS: 271 10*3/uL (ref 150.0–400.0)
RBC: 4.35 Mil/uL (ref 3.87–5.11)
RDW: 13.3 % (ref 11.5–15.5)
WBC: 6.6 10*3/uL (ref 4.0–10.5)

## 2017-07-22 LAB — LIPID PANEL
CHOLESTEROL: 194 mg/dL (ref 0–200)
HDL: 32.9 mg/dL — ABNORMAL LOW (ref >39.00)
LDL Cholesterol: 131 mg/dL — ABNORMAL HIGH (ref 0–99)
NONHDL: 160.92
Total CHOL/HDL Ratio: 6
Triglycerides: 148 mg/dL (ref 0.0–149.0)
VLDL: 29.6 mg/dL (ref 0.0–40.0)

## 2017-07-22 LAB — HEPATIC FUNCTION PANEL
ALK PHOS: 80 U/L (ref 39–117)
ALT: 8 U/L (ref 0–35)
AST: 11 U/L (ref 0–37)
Albumin: 3.9 g/dL (ref 3.5–5.2)
BILIRUBIN TOTAL: 0.5 mg/dL (ref 0.2–1.2)
Bilirubin, Direct: 0.1 mg/dL (ref 0.0–0.3)
Total Protein: 6.6 g/dL (ref 6.0–8.3)

## 2017-07-22 LAB — TSH: TSH: 1.49 u[IU]/mL (ref 0.35–4.50)

## 2017-07-22 LAB — VITAMIN D 25 HYDROXY (VIT D DEFICIENCY, FRACTURES): VITD: 32.56 ng/mL (ref 30.00–100.00)

## 2017-07-25 ENCOUNTER — Ambulatory Visit (INDEPENDENT_AMBULATORY_CARE_PROVIDER_SITE_OTHER): Payer: BLUE CROSS/BLUE SHIELD

## 2017-07-25 DIAGNOSIS — Z23 Encounter for immunization: Secondary | ICD-10-CM

## 2017-07-26 ENCOUNTER — Ambulatory Visit: Payer: Self-pay

## 2017-09-11 ENCOUNTER — Ambulatory Visit: Payer: Self-pay | Admitting: *Deleted

## 2017-09-11 NOTE — Telephone Encounter (Signed)
Pt reports cough, post nasal drip for 1 week.  States cough has improved; coughing up yellow secretions.  Initially secretions "greenish."  States felt warm several days ago "not now." not able to check temperature. Feels "achy" and fatigued. Reports husband and granddaughter had similar symptoms several weeks ago. Home Care advised, nasal washes, rest, OTC cough medications. Instructed to check with pharmacist before taking OTC meds due to history of HTN. Instructed to call back if symptoms worsen.  Reason for Disposition . Cough with cold symptoms (e.g., runny nose, postnasal drip, throat clearing)  Answer Assessment - Initial Assessment Questions 1. ONSET: "When did the cough begin?"      09/04/17 2. SEVERITY: "How bad is the cough today?"      Constant, severe 3. RESPIRATORY DISTRESS: "Describe your breathing."      ok 4. FEVER: "Do you have a fever?" If so, ask: "What is your temperature, how was it measured, and when did it start?"     Few days ago felt warm 5. SPUTUM: "Describe the color of your sputum" (clear, white, yellow, green)     Yellow, thick 6. HEMOPTYSIS: "Are you coughing up any blood?" If so ask: "How much?" (flecks, streaks, tablespoons, etc.)     "Not now, first day I did." 7. CARDIAC HISTORY: "Do you have any history of heart disease?" (e.g., heart attack, congestive heart failure)     HTN 8. LUNG HISTORY: "Do you have any history of lung disease?"  (e.g., pulmonary embolus, asthma, emphysema)    No 9. PE RISK FACTORS: "Do you have a history of blood clots?" (or: recent major surgery, recent prolonged travel, bedridden )     No 10. OTHER SYMPTOMS: "Do you have any other symptoms?" (e.g., runny nose, wheezing, chest pain)       Post nasal drip,fatigued,achy all over.  Protocols used: COUGH - ACUTE PRODUCTIVE-A-AH

## 2017-10-05 ENCOUNTER — Other Ambulatory Visit: Payer: Self-pay | Admitting: Internal Medicine

## 2017-11-01 ENCOUNTER — Other Ambulatory Visit: Payer: Self-pay | Admitting: Internal Medicine

## 2017-11-01 NOTE — Telephone Encounter (Signed)
Amlodipine refill Last OV: 07/12/17 PCP: Dr. Lorin PicketScott [facility] [place] - [address] [phone] (Phone) [phone] (Fax)

## 2017-11-01 NOTE — Telephone Encounter (Signed)
Copied from CRM [phone]. Topic: Quick Communication - Rx Refill/Question >> Nov 01, 2017  1:37 PM Diana EvesHoyt, Maryann B wrote: Medication: amlodipine   Pt is requesting this be a 90 day supply.   Has the patient contacted their pharmacy? Yes.     (Agent: If no, request that the patient contact the pharmacy for the refill.)   Preferred Pharmacy (with phone number or street name): [facility] - [place], [place] - [address]   Agent: Please be advised that RX refills may take up to 3 business days. We ask that you follow-up with your pharmacy.

## 2017-11-06 MED ORDER — AMLODIPINE BESYLATE 5 MG PO TABS: 5.0000 mg | ORAL_TABLET | Freq: Every day | ORAL | 2 refills | Status: DC

## 2017-11-08 ENCOUNTER — Other Ambulatory Visit: Payer: Self-pay | Admitting: Internal Medicine

## 2017-11-19 ENCOUNTER — Ambulatory Visit (INDEPENDENT_AMBULATORY_CARE_PROVIDER_SITE_OTHER): Payer: BLUE CROSS/BLUE SHIELD | Admitting: Internal Medicine

## 2017-11-19 ENCOUNTER — Encounter: Payer: Self-pay | Admitting: Internal Medicine

## 2017-11-19 DIAGNOSIS — F419 Anxiety disorder, unspecified: Secondary | ICD-10-CM

## 2017-11-19 DIAGNOSIS — E559 Vitamin D deficiency, unspecified: Secondary | ICD-10-CM | POA: Diagnosis not present

## 2017-11-19 DIAGNOSIS — I10 Essential (primary) hypertension: Secondary | ICD-10-CM

## 2017-11-19 DIAGNOSIS — E7800 Pure hypercholesterolemia, unspecified: Secondary | ICD-10-CM

## 2017-11-19 MED ORDER — AMLODIPINE BESYLATE 5 MG PO TABS: 5.0000 mg | ORAL_TABLET | Freq: Every day | ORAL | 1 refills | Status: DC

## 2017-11-19 NOTE — Progress Notes (Signed)
Patient ID: Kim PacasJudy B Love, female   DOB: 29-Jul-1956, 62 y.o.   MRN: [id number]   Subjective:    Patient ID: Kim Love, female    DOB: 29-Jul-1956, 62 y.o.   MRN: [id number]  HPI  Patient here for a scheduled follow up.  She reports she is doing relatively well.  Some increased stress, but she feels she is handling things relatively well.  Does not feel needs any further intervention.  No chest pain.  No sob.  No acid reflux.  No abdominal pain.  Bowels moving.  Blood pressure doing well.     Past Medical History:  Diagnosis Date  . Anxiety   . Hypercholesterolemia   . Hypertension    Past Surgical History:  Procedure Laterality Date  . TONSILLECTOMY AND ADENOIDECTOMY Bilateral 1980   Family History  Problem Relation Age of Onset  . Stroke Maternal Grandmother    Social History   Socioeconomic History  . Marital status: Married    Spouse name: None  . Number of children: 1  . Years of education: None  . Highest education level: None  Social Needs  . Financial resource strain: None  . Food insecurity - worry: None  . Food insecurity - inability: None  . Transportation needs - medical: None  . Transportation needs - non-medical: None  Occupational History    Employer: [employer]  Tobacco Use  . Smoking status: Former Smoker    Types: Cigarettes    Last attempt to quit: 09/10/2012    Years since quitting: 5.2  . Smokeless tobacco: Current User  Substance and Sexual Activity  . Alcohol use: No    Alcohol/week: 0.0 oz  . Drug use: No  . Sexual activity: None  Other Topics Concern  . None  Social History Narrative  . None    Outpatient Encounter Medications as of 11/19/2017  Medication Sig  . amLODipine (NORVASC) 5 MG tablet Take 1 tablet (5 mg total) by mouth daily.  . Cholecalciferol (VITAMIN D-3 PO) Take by mouth.  . citalopram (CELEXA) 20 MG tablet TAKE ONE (1) TABLET EACH DAY  . Clobetasol Prop Emollient Base (CLOBETASOL PROPIONATE E) 0.05 % emollient cream  Twice daily to affected areas.  . loratadine (CLARITIN) 10 MG tablet Take 1 tablet (10 mg total) by mouth daily.  Marland Kitchen. losartan-hydrochlorothiazide (HYZAAR) 100-25 MG tablet TAKE 1 TABLET BY MOUTH EVERY DAY  . [DISCONTINUED] amLODipine (NORVASC) 5 MG tablet Take 1 tablet (5 mg total) by mouth daily.  . fluticasone (FLONASE) 50 MCG/ACT nasal spray Place 2 sprays into both nostrils daily. (Patient not taking: Reported on 11/19/2017)   No facility-administered encounter medications on file as of 11/19/2017.     Review of Systems  Constitutional: Negative for appetite change and unexpected weight change.  HENT: Negative for congestion and sinus pressure.   Respiratory: Negative for cough, chest tightness and shortness of breath.   Cardiovascular: Negative for chest pain, palpitations and leg swelling.  Gastrointestinal: Negative for abdominal pain, diarrhea, nausea and vomiting.  Genitourinary: Negative for difficulty urinating and dysuria.  Musculoskeletal: Negative for joint swelling and myalgias.  Skin: Negative for color change and rash.  Neurological: Negative for dizziness, light-headedness and headaches.  Psychiatric/Behavioral: Negative for agitation and dysphoric mood.       Objective:    Physical Exam  Constitutional: She appears well-developed and well-nourished. No distress.  HENT:  Nose: Nose normal.  Mouth/Throat: Oropharynx is clear and moist.  Neck: Neck supple. No thyromegaly  present.  Cardiovascular: Normal rate and regular rhythm.  Pulmonary/Chest: Breath sounds normal. No respiratory distress. She has no wheezes.  Abdominal: Soft. Bowel sounds are normal. There is no tenderness.  Musculoskeletal: She exhibits no edema or tenderness.  Lymphadenopathy:    She has no cervical adenopathy.  Skin: No rash noted. No erythema.  Psychiatric: She has a normal mood and affect. Her behavior is normal.    BP 130/80 (BP Location: Left Arm, Patient Position: Sitting, Cuff Size:  Normal)   Pulse 76   Temp 98.7 F (37.1 C) (Oral)   Resp 18   Wt 146 lb 6.4 oz (66.4 kg)   SpO2 96%   BMI 25.93 kg/m  Wt Readings from Last 3 Encounters:  11/19/17 146 lb 6.4 oz (66.4 kg)  07/12/17 144 lb 9.6 oz (65.6 kg)  04/10/17 142 lb 12.8 oz (64.8 kg)     Lab Results  Component Value Date   WBC 6.6 07/22/2017   HGB 13.7 07/22/2017   HCT 40.2 07/22/2017   PLT 271.0 07/22/2017   GLUCOSE 97 07/22/2017   CHOL 194 07/22/2017   TRIG 148.0 07/22/2017   HDL 32.90 (L) 07/22/2017   LDLCALC 131 (H) 07/22/2017   ALT 8 07/22/2017   AST 11 07/22/2017   NA 143 07/22/2017   K 4.0 07/22/2017   CL 103 07/22/2017   CREATININE 0.82 07/22/2017   BUN 8 07/22/2017   CO2 30 07/22/2017   TSH 1.49 07/22/2017       Assessment & Plan:   Problem List Items Addressed This Visit    Anxiety    Doing well on citalopram.  Follow.       Essential hypertension, benign    Blood pressure under good control.  Continue same medication regimen.  Follow pressures.  Follow metabolic panel.        Relevant Medications   amLODipine (NORVASC) 5 MG tablet   Other Relevant Orders   Basic metabolic panel   Hypercholesterolemia    Low cholesterol diet and exercise.  Follow lipid panel.        Relevant Medications   amLODipine (NORVASC) 5 MG tablet   Other Relevant Orders   Hepatic function panel   Lipid panel   Vitamin D deficiency    Follow vitamin D level.           Dale [place], MD

## 2017-11-22 ENCOUNTER — Encounter: Payer: Self-pay | Admitting: Internal Medicine

## 2017-11-22 NOTE — Assessment & Plan Note (Signed)
Blood pressure under good control.  Continue same medication regimen.  Follow pressures.  Follow metabolic panel.   

## 2017-11-22 NOTE — Assessment & Plan Note (Signed)
Follow vitamin D level.  

## 2017-11-22 NOTE — Assessment & Plan Note (Signed)
Doing well on citalopram.  Follow.   

## 2017-11-22 NOTE — Assessment & Plan Note (Signed)
Low cholesterol diet and exercise.  Follow lipid panel.   

## 2017-11-26 ENCOUNTER — Encounter: Payer: Self-pay | Admitting: Internal Medicine

## 2017-11-27 ENCOUNTER — Encounter: Payer: Self-pay | Admitting: Internal Medicine

## 2017-11-28 ENCOUNTER — Other Ambulatory Visit: Payer: Self-pay

## 2017-11-28 MED ORDER — CITALOPRAM HYDROBROMIDE 20 MG PO TABS: ORAL_TABLET | ORAL | 1 refills | Status: DC

## 2017-12-06 ENCOUNTER — Telehealth: Payer: Self-pay | Admitting: Internal Medicine

## 2017-12-06 NOTE — Telephone Encounter (Signed)
Copied from CRM [phone]. Topic: Quick Communication - See Telephone Encounter >> Dec 06, 2017  3:17 PM Oneal GroutSebastian, Jennifer S wrote: CRM for notification. See Telephone encounter for: 12/06/17. Started [facility]les injections with [facility]

## 2018-01-22 ENCOUNTER — Other Ambulatory Visit: Payer: Self-pay

## 2018-03-06 ENCOUNTER — Other Ambulatory Visit: Payer: Self-pay | Admitting: Internal Medicine

## 2018-03-26 ENCOUNTER — Other Ambulatory Visit (INDEPENDENT_AMBULATORY_CARE_PROVIDER_SITE_OTHER): Payer: BLUE CROSS/BLUE SHIELD

## 2018-03-26 DIAGNOSIS — I10 Essential (primary) hypertension: Secondary | ICD-10-CM

## 2018-03-26 DIAGNOSIS — E7800 Pure hypercholesterolemia, unspecified: Secondary | ICD-10-CM | POA: Diagnosis not present

## 2018-03-26 LAB — LDL CHOLESTEROL, DIRECT: Direct LDL: 129 mg/dL

## 2018-03-26 LAB — BASIC METABOLIC PANEL
BUN: 7 mg/dL (ref 6–23)
CALCIUM: 9.2 mg/dL (ref 8.4–10.5)
CO2: 32 mEq/L (ref 19–32)
Chloride: 100 mEq/L (ref 96–112)
Creatinine, Ser: 0.87 mg/dL (ref 0.40–1.20)
GFR: 70.39 mL/min (ref >60.00)
Glucose, Bld: 97 mg/dL (ref 70–99)
Potassium: 3.5 mEq/L (ref 3.5–5.1)
SODIUM: 139 meq/L (ref 135–145)

## 2018-03-26 LAB — HEPATIC FUNCTION PANEL
ALBUMIN: 4 g/dL (ref 3.5–5.2)
ALK PHOS: 83 U/L (ref 39–117)
ALT: 12 U/L (ref 0–35)
AST: 14 U/L (ref 0–37)
Bilirubin, Direct: 0 mg/dL (ref 0.0–0.3)
TOTAL PROTEIN: 7.2 g/dL (ref 6.0–8.3)
Total Bilirubin: 0.4 mg/dL (ref 0.2–1.2)

## 2018-03-26 LAB — LIPID PANEL
Cholesterol: 195 mg/dL (ref 0–200)
HDL: 34.9 mg/dL — AB (ref >39.00)
NonHDL: 159.89
TRIGLYCERIDES: 233 mg/dL — AB (ref 0.0–149.0)
Total CHOL/HDL Ratio: 6
VLDL: 46.6 mg/dL — ABNORMAL HIGH (ref 0.0–40.0)

## 2018-04-02 ENCOUNTER — Ambulatory Visit: Payer: BLUE CROSS/BLUE SHIELD | Admitting: Internal Medicine

## 2018-04-02 ENCOUNTER — Encounter: Payer: Self-pay | Admitting: Internal Medicine

## 2018-04-02 DIAGNOSIS — F419 Anxiety disorder, unspecified: Secondary | ICD-10-CM

## 2018-04-02 DIAGNOSIS — E559 Vitamin D deficiency, unspecified: Secondary | ICD-10-CM | POA: Diagnosis not present

## 2018-04-02 DIAGNOSIS — I10 Essential (primary) hypertension: Secondary | ICD-10-CM | POA: Diagnosis not present

## 2018-04-02 DIAGNOSIS — E7800 Pure hypercholesterolemia, unspecified: Secondary | ICD-10-CM

## 2018-04-02 NOTE — Progress Notes (Signed)
Patient ID: Kim Love, female   DOB: 11-27-1955, 62 y.o.   MRN: [id number]   Subjective:    Patient ID: Kim Love, female    DOB: 25-Jun-1956, 62 y.o.   MRN: [id number]  HPI  Patient here for a scheduled follow up.  She reports she is doing relatively well.  Still with increased stress.  Overall she feels she is handling things relatively well.  Does not feel needs any further intervention at this time.  No chest pain.  No sob.  No acid reflux.  No abdominal pain.  Bowels moving.  Followed by gyn (Dr Dareen Piano) for pelvic and pap smears.  Blood pressure checks 113/62 and 125/70s.  Has noticed some fatigue after taking the hyzaar in the am.  Better in the evening.     Past Medical History:  Diagnosis Date  . Anxiety   . Hypercholesterolemia   . Hypertension    Past Surgical History:  Procedure Laterality Date  . TONSILLECTOMY AND ADENOIDECTOMY Bilateral 1980   Family History  Problem Relation Age of Onset  . Stroke Maternal Grandmother    Social History   Socioeconomic History  . Marital status: Married    Spouse name: Not on file  . Number of children: 1  . Years of education: Not on file  . Highest education level: Not on file  Occupational History    Employer: [employer]  Social Needs  . Financial resource strain: Not on file  . Food insecurity:    Worry: Not on file    Inability: Not on file  . Transportation needs:    Medical: Not on file    Non-medical: Not on file  Tobacco Use  . Smoking status: Former Smoker    Types: Cigarettes    Last attempt to quit: 09/10/2012    Years since quitting: 5.5  . Smokeless tobacco: Current User  Substance and Sexual Activity  . Alcohol use: No    Alcohol/week: 0.0 oz  . Drug use: No  . Sexual activity: Not on file  Lifestyle  . Physical activity:    Days per week: Not on file    Minutes per session: Not on file  . Stress: Not on file  Relationships  . Social connections:    Talks on phone: Not on file    Gets  together: Not on file    Attends religious service: Not on file    Active member of club or organization: Not on file    Attends meetings of clubs or organizations: Not on file    Relationship status: Not on file  Other Topics Concern  . Not on file  Social History Narrative  . Not on file    Outpatient Encounter Medications as of 04/02/2018  Medication Sig  . amLODipine (NORVASC) 5 MG tablet Take 1 tablet (5 mg total) by mouth daily.  . Cholecalciferol (VITAMIN D-3 PO) Take by mouth.  . citalopram (CELEXA) 20 MG tablet TAKE ONE (1) TABLET EACH DAY  . citalopram (CELEXA) 20 MG tablet TAKE ONE (1) TABLET EACH DAY  . Clobetasol Prop Emollient Base (CLOBETASOL PROPIONATE E) 0.05 % emollient cream Twice daily to affected areas.  . fluticasone (FLONASE) 50 MCG/ACT nasal spray Place 2 sprays into both nostrils daily. (Patient not taking: Reported on 11/19/2017)  . loratadine (CLARITIN) 10 MG tablet Take 1 tablet (10 mg total) by mouth daily.  Marland Kitchen losartan-hydrochlorothiazide (HYZAAR) 100-25 MG tablet TAKE 1 TABLET BY MOUTH EVERY DAY  No facility-administered encounter medications on file as of 04/02/2018.     Review of Systems  Constitutional: Positive for fatigue. Negative for appetite change and unexpected weight change.  HENT: Negative for congestion and sinus pressure.   Respiratory: Negative for cough, chest tightness and shortness of breath.   Cardiovascular: Negative for chest pain, palpitations and leg swelling.  Gastrointestinal: Negative for abdominal pain, diarrhea, nausea and vomiting.  Genitourinary: Negative for difficulty urinating and dysuria.  Musculoskeletal: Negative for joint swelling and myalgias.  Skin: Negative for color change and rash.  Neurological: Negative for dizziness, light-headedness and headaches.  Psychiatric/Behavioral: Negative for agitation and dysphoric mood.       Objective:    Physical Exam  Constitutional: She appears well-developed and  well-nourished. No distress.  HENT:  Nose: Nose normal.  Mouth/Throat: Oropharynx is clear and moist.  Neck: Neck supple. No thyromegaly present.  Cardiovascular: Normal rate and regular rhythm.  Pulmonary/Chest: Breath sounds normal. No respiratory distress. She has no wheezes.  Abdominal: Soft. Bowel sounds are normal. There is no tenderness.  Musculoskeletal: She exhibits no edema or tenderness.  Lymphadenopathy:    She has no cervical adenopathy.  Skin: No rash noted. No erythema.  Psychiatric: She has a normal mood and affect. Her behavior is normal.    BP 126/74 (BP Location: Left Arm, Patient Position: Sitting, Cuff Size: Normal)   Pulse 66   Temp 97.7 F (36.5 C) (Oral)   Resp 18   Wt 145 lb (65.8 kg)   SpO2 96%   BMI 25.69 kg/m  Wt Readings from Last 3 Encounters:  04/02/18 145 lb (65.8 kg)  11/19/17 146 lb 6.4 oz (66.4 kg)  07/12/17 144 lb 9.6 oz (65.6 kg)     Lab Results  Component Value Date   WBC 6.6 07/22/2017   HGB 13.7 07/22/2017   HCT 40.2 07/22/2017   PLT 271.0 07/22/2017   GLUCOSE 97 03/26/2018   CHOL 195 03/26/2018   TRIG 233.0 (H) 03/26/2018   HDL 34.90 (L) 03/26/2018   LDLDIRECT 129.0 03/26/2018   LDLCALC 131 (H) 07/22/2017   ALT 12 03/26/2018   AST 14 03/26/2018   NA 139 03/26/2018   K 3.5 03/26/2018   CL 100 03/26/2018   CREATININE 0.87 03/26/2018   BUN 7 03/26/2018   CO2 32 03/26/2018   TSH 1.49 07/22/2017       Assessment & Plan:   Problem List Items Addressed This Visit    Anxiety    Doing well on citalopram.  She feels she is handling things relatively well.  Follow.        Essential hypertension, benign    Blood pressures appear to be doing well.  No significant low pressures.  She feels is contributing to fatigue.  Discussed with her at length today.  Discussed adjusting medications.  Discussed fatigue could be multifactorial.  She goes to be late and gets up early when her husband gets ready for work.  Discussed need to  adjust sleep routine, etc.  She wants to monitor.  Hold on making changes in medication.  Follow.        Relevant Orders   CBC with Differential/Platelet   TSH   Basic metabolic panel   Hypercholesterolemia    Low cholesterol diet and exercise.  Triglycerides increased on recent check.  [facility] lipid diet.  Follow lipid panel.        Relevant Orders   Hepatic function panel   Lipid panel   Vitamin  D deficiency    Follow vitamin D level.        Relevant Orders   VITAMIN D 25 Hydroxy (Vit-D Deficiency, Fractures)       Dale [place], MD

## 2018-04-05 ENCOUNTER — Encounter: Payer: Self-pay | Admitting: Internal Medicine

## 2018-04-05 NOTE — Assessment & Plan Note (Addendum)
Low cholesterol diet and exercise.  Triglycerides increased on recent check.  [facility] lipid diet.  Follow lipid panel.

## 2018-04-05 NOTE — Assessment & Plan Note (Signed)
Follow vitamin D level.  

## 2018-04-05 NOTE — Assessment & Plan Note (Signed)
Doing well on citalopram.  She feels she is handling things relatively well.  Follow.

## 2018-04-05 NOTE — Assessment & Plan Note (Signed)
Blood pressures appear to be doing well.  No significant low pressures.  She feels is contributing to fatigue.  Discussed with her at length today.  Discussed adjusting medications.  Discussed fatigue could be multifactorial.  She goes to be late and gets up early when her husband gets ready for work.  Discussed need to adjust sleep routine, etc.  She wants to monitor.  Hold on making changes in medication.  Follow.

## 2018-05-16 ENCOUNTER — Other Ambulatory Visit: Payer: Self-pay | Admitting: Internal Medicine

## 2018-05-29 ENCOUNTER — Ambulatory Visit: Payer: BLUE CROSS/BLUE SHIELD | Admitting: Internal Medicine

## 2018-05-29 ENCOUNTER — Encounter: Payer: Self-pay | Admitting: Internal Medicine

## 2018-05-29 VITALS — BP 136/80 | HR 75 | Temp 98.3°F | Ht 63.0 in | Wt 145.8 lb

## 2018-05-29 DIAGNOSIS — Z23 Encounter for immunization: Secondary | ICD-10-CM

## 2018-05-29 DIAGNOSIS — H6991 Unspecified Eustachian tube disorder, right ear: Secondary | ICD-10-CM

## 2018-05-29 DIAGNOSIS — H6192 Disorder of left external ear, unspecified: Secondary | ICD-10-CM

## 2018-05-29 DIAGNOSIS — L989 Disorder of the skin and subcutaneous tissue, unspecified: Secondary | ICD-10-CM

## 2018-05-29 DIAGNOSIS — H9201 Otalgia, right ear: Secondary | ICD-10-CM

## 2018-05-29 MED ORDER — AMOXICILLIN-POT CLAVULANATE 875-125 MG PO TABS: 1.0000 | ORAL_TABLET | Freq: Two times a day (BID) | ORAL | 0 refills | Status: DC

## 2018-05-29 MED ORDER — MUPIROCIN 2 % EX OINT: 1.0000 "application " | TOPICAL_OINTMENT | Freq: Two times a day (BID) | CUTANEOUS | 0 refills | Status: DC

## 2018-05-29 MED ORDER — FLUCONAZOLE 150 MG PO TABS: 150.0000 mg | ORAL_TABLET | Freq: Once | ORAL | 0 refills | Status: AC

## 2018-05-29 NOTE — Progress Notes (Signed)
Chief Complaint  Patient presents with  . Ear Pain   F/u  1. Right ear pain and gland swelling in front of right ear and below right pain. Sx's x 1 week bump and swelling inside right ear x 3 days and itching itching now resolved ear feeling somewhat better. C/o ears being stuffy and running water, throbbing right ear pain when lying right ear on pillow 7/10.  2. HTN controlled today norvasc 5 mg qd, hyzaar 100-25  3. Overdue x 7 days for shingrix vaccine advised to get asap will given in clinic today  Review of Systems  Constitutional: Negative for weight loss.  HENT: Positive for ear discharge.        +right ear pain  Eyes: Negative for blurred vision.  Respiratory: Negative for shortness of breath.   Cardiovascular: Negative for chest pain.  Endo/Heme/Allergies:       +lymphadenopathy right ear    Past Medical History:  Diagnosis Date  . Anxiety   . Hypercholesterolemia   . Hypertension    Past Surgical History:  Procedure Laterality Date  . TONSILLECTOMY AND ADENOIDECTOMY Bilateral 1980   Family History  Problem Relation Age of Onset  . Stroke Maternal Grandmother    Social History   Socioeconomic History  . Marital status: Married    Spouse name: Not on file  . Number of children: 1  . Years of education: Not on file  . Highest education level: Not on file  Occupational History    Employer: [employer]  Social Needs  . Financial resource strain: Not on file  . Food insecurity:    Worry: Not on file    Inability: Not on file  . Transportation needs:    Medical: Not on file    Non-medical: Not on file  Tobacco Use  . Smoking status: Former Smoker    Types: Cigarettes    Last attempt to quit: 09/10/2012    Years since quitting: 5.7  . Smokeless tobacco: Current User  Substance and Sexual Activity  . Alcohol use: No    Alcohol/week: 0.0 standard drinks  . Drug use: No  . Sexual activity: Not on file  Lifestyle  . Physical activity:    Days per week:  Not on file    Minutes per session: Not on file  . Stress: Not on file  Relationships  . Social connections:    Talks on phone: Not on file    Gets together: Not on file    Attends religious service: Not on file    Active member of club or organization: Not on file    Attends meetings of clubs or organizations: Not on file    Relationship status: Not on file  . Intimate partner violence:    Fear of current or ex partner: Not on file    Emotionally abused: Not on file    Physically abused: Not on file    Forced sexual activity: Not on file  Other Topics Concern  . Not on file  Social History Narrative  . Not on file   Current Meds  Medication Sig  . amLODipine (NORVASC) 5 MG tablet Take 1 tablet (5 mg total) by mouth daily.  . Cholecalciferol (VITAMIN D-3 PO) Take by mouth.  . citalopram (CELEXA) 20 MG tablet TAKE ONE (1) TABLET EACH DAY  . Clobetasol Prop Emollient Base (CLOBETASOL PROPIONATE E) 0.05 % emollient cream Twice daily to affected areas.  . fluticasone (FLONASE) 50 MCG/ACT nasal spray Place  2 sprays into both nostrils daily.  Marland Kitchen loratadine (CLARITIN) 10 MG tablet Take 1 tablet (10 mg total) by mouth daily.  Marland Kitchen losartan-hydrochlorothiazide (HYZAAR) 100-25 MG tablet TAKE 1 TABLET BY MOUTH EVERY DAY   Allergies  Allergen Reactions  . Povidone-Iodine Swelling  . Sulfamethoxazole-Trimethoprim Nausea And Vomiting  . Atenolol Other (See Comments)    Bradycardia  . Codeine     REACTION: "gallbladder issues"  . Erythromycin     REACTION: nausea  . Erythromycin     Upset stomach  . Olmesartan     Other reaction(s): Other (See Comments) Increased BP.  Marland Kitchen Codeine     Gallbladder issues   Recent Results (from the past 2160 hour(s))  Basic metabolic panel     Status: None   Collection Time: 03/26/18  8:18 AM  Result Value Ref Range   Sodium 139 135 - 145 mEq/L   Potassium 3.5 3.5 - 5.1 mEq/L   Chloride 100 96 - 112 mEq/L   CO2 32 19 - 32 mEq/L   Glucose, Bld 97 70 -  99 mg/dL   BUN 7 6 - 23 mg/dL   Creatinine, Ser 9.60 0.40 - 1.20 mg/dL   Calcium 9.2 8.4 - 45.4 mg/dL   GFR 09.81 >19.14 mL/min  Lipid panel     Status: Abnormal   Collection Time: 03/26/18  8:18 AM  Result Value Ref Range   Cholesterol 195 0 - 200 mg/dL    Comment: ATP III Classification       Desirable:  < 200 mg/dL               Borderline High:  200 - 239 mg/dL          High:  > = 782 mg/dL   Triglycerides 956.2 (H) 0.0 - 149.0 mg/dL    Comment: Normal:  <130 mg/dLBorderline High:  150 - 199 mg/dL   HDL 86.57 (L) >84.69 mg/dL   VLDL 62.9 (H) 0.0 - 52.8 mg/dL   Total CHOL/HDL Ratio 6     Comment:                Men          Women1/2 Average Risk     3.4          3.3Average Risk          5.0          4.42X Average Risk          9.6          7.13X Average Risk          15.0          11.0                       NonHDL 159.89     Comment: NOTE:  Non-HDL goal should be 30 mg/dL higher than patient's LDL goal (i.e. LDL goal of < 70 mg/dL, would have non-HDL goal of < 100 mg/dL)  Hepatic function panel     Status: None   Collection Time: 03/26/18  8:18 AM  Result Value Ref Range   Total Bilirubin 0.4 0.2 - 1.2 mg/dL   Bilirubin, Direct 0.0 0.0 - 0.3 mg/dL   Alkaline Phosphatase 83 39 - 117 U/L   AST 14 0 - 37 U/L   ALT 12 0 - 35 U/L   Total Protein 7.2 6.0 - 8.3 g/dL   Albumin 4.0 3.5 - 5.2 g/dL  LDL cholesterol, direct     Status: None   Collection Time: 03/26/18  8:18 AM  Result Value Ref Range   Direct LDL 129.0 mg/dL    Comment: Optimal:  <161<100 mg/dLNear or Above Optimal:  100-129 mg/dLBorderline High:  130-159 mg/dLHigh:  160-189 mg/dLVery High:  >190 mg/dL   Objective  Body mass index is 25.83 kg/m. Wt Readings from Last 3 Encounters:  05/29/18 145 lb 12.8 oz (66.1 kg)  04/02/18 145 lb (65.8 kg)  11/19/17 146 lb 6.4 oz (66.4 kg)   Temp Readings from Last 3 Encounters:  05/29/18 98.3 F (36.8 C) (Oral)  04/02/18 97.7 F (36.5 C) (Oral)  11/19/17 98.7 F (37.1 C) (Oral)     BP Readings from Last 3 Encounters:  05/29/18 136/80  04/02/18 126/74  11/19/17 130/80   Pulse Readings from Last 3 Encounters:  05/29/18 75  04/02/18 66  11/19/17 76    Physical Exam  Constitutional: She is oriented to person, place, and time. Vital signs are normal. She appears well-developed and well-nourished. She is cooperative.  HENT:  Head: Normocephalic and atraumatic.    Mouth/Throat: Oropharynx is clear and moist and mucous membranes are normal.  See ear comments under HEENT  Eyes: Pupils are equal, round, and reactive to light. Conjunctivae are normal.  Cardiovascular: Normal rate, regular rhythm and normal heart sounds.  Pulmonary/Chest: Effort normal and breath sounds normal.  Neurological: She is alert and oriented to person, place, and time. Gait normal.  Skin: Skin is warm, dry and intact.  Psychiatric: She has a normal mood and affect. Her speech is normal and behavior is normal. Judgment and thought content normal. Cognition and memory are normal.  Nursing note and vitals reviewed.   Assessment   1. Ulceration right ear healing but still inflamed likely causing pain and lymphadenopathy, eustachian tube dysfunction  2. HTN  3. HM Plan   1. Bactroban, augmentin with diflucan prn  Try otc flonase and AH w/o decongestant  Prn Tylenol or Ibuprofen sparingly lymph node pain   2. Cont meds  3. Pt wants to wait on flu vx until 06/2018   Given 2nd shingrix today overdue by 7 days other received 11/19/17  Provider: Dr. French Anaracy [facility]

## 2018-05-29 NOTE — Patient Instructions (Addendum)
Claritin, zyrtec or allegra w/o D. Take 1 pill at night  Flonase 2 sprays max  Tylenol or Ibuprofen as needed lymph node pain  Consider flu shot   Earache, Adult An earache, or ear pain, can be caused by many things, including:  An infection.  Ear wax buildup.  Ear pressure.  Something in the ear that should not be there (foreign body).  A sore throat.  Tooth problems.  Jaw problems.  Treatment of the earache will depend on the cause. If the cause is not clear or cannot be determined, you may need to watch your symptoms until your earache goes away or until a cause is found. Follow these instructions at home: Pay attention to any changes in your symptoms. Take these actions to help with your pain:  Take or apply over-the-counter and prescription medicines only as told by your health care provider.  If you were prescribed an antibiotic medicine, use it as told by your health care provider. Do not stop using the antibiotic even if you start to feel better.  Do not put anything in your ear other than medicine that is prescribed by your health care provider.  If directed, apply heat to the affected area as often as told by your health care provider. Use the heat source that your health care provider recommends, such as a moist heat pack or a heating pad. ? Place a towel between your skin and the heat source. ? Leave the heat on for 20-30 minutes. ? Remove the heat if your skin turns bright red. This is especially important if you are unable to feel pain, heat, or cold. You may have a greater risk of getting burned.  If directed, put ice on the ear: ? Put ice in a plastic bag. ? Place a towel between your skin and the bag. ? Leave the ice on for 20 minutes, 2-3 times a day.  Try resting in an upright position instead of lying down. This may help to reduce pressure in your ear and relieve pain.  Chew gum if it helps to relieve your ear pain.  Treat any allergies as told by  your health care provider.  Keep all follow-up visits as told by your health care provider. This is important.  Contact a health care provider if:  Your pain does not improve within 2 days.  Your earache gets worse.  You have new symptoms.  You have a fever. Get help right away if:  You have a severe headache.  You have a stiff neck.  You have trouble swallowing.  You have redness or swelling behind your ear.  You have fluid or blood coming from your ear.  You have hearing loss.  You feel dizzy. This information is not intended to replace advice given to you by your health care provider. Make sure you discuss any questions you have with your health care provider. Document Released: 04/13/2004 Document Revised: 04/24/2016 Document Reviewed: 02/20/2016 Elsevier Interactive Patient Education  2018 [employer]  Lymphadenopathy Lymphadenopathy refers to swollen or enlarged lymph glands, also called lymph nodes. Lymph glands are part of your body's defense (immune) system, which protects the body from infections, germs, and diseases. Lymph glands are found in many locations in your body, including the neck, underarm, and groin. Many things can cause lymph glands to become enlarged. When your immune system responds to germs, such as viruses or bacteria, infection-fighting cells and fluid build up. This causes the glands to grow in size.  Usually, this is not something to worry about. The swelling and any soreness often go away without treatment. However, swollen lymph glands can also be caused by a number of diseases. Your health care provider may do various tests to help determine the cause. If the cause of your swollen lymph glands cannot be found, it is important to monitor your condition to make sure the swelling goes away. Follow these instructions at home: Watch your condition for any changes. The following actions may help to lessen any discomfort you are feeling:  Get plenty  of rest.  Take medicines only as directed by your health care provider. Your health care provider may recommend over-the-counter medicines for pain.  Apply moist heat compresses to the site of swollen lymph nodes as directed by your health care provider. This can help reduce any pain.  Check your lymph nodes daily for any changes.  Keep all follow-up visits as directed by your health care provider. This is important.  Contact a health care provider if:  Your lymph nodes are still swollen after 2 weeks.  Your swelling increases or spreads to other areas.  Your lymph nodes are hard, seem fixed to the skin, or are growing rapidly.  Your skin over the lymph nodes is red and inflamed.  You have a fever.  You have chills.  You have fatigue.  You develop a sore throat.  You have abdominal pain.  You have weight loss.  You have night sweats. Get help right away if:  You notice fluid leaking from the area of the enlarged lymph node.  You have severe pain in any area of your body.  You have chest pain.  You have shortness of breath. This information is not intended to replace advice given to you by your health care provider. Make sure you discuss any questions you have with your health care provider. Document Released: 06/05/2008 Document Revised: 02/02/2016 Document Reviewed: 04/01/2014 Elsevier Interactive Patient Education  [employer].

## 2018-05-29 NOTE — Progress Notes (Signed)
Pre visit review using our clinic review tool, if applicable. No additional management support is needed unless otherwise documented below in the visit note. 

## 2018-06-03 ENCOUNTER — Other Ambulatory Visit: Payer: Self-pay | Admitting: Internal Medicine

## 2018-06-05 ENCOUNTER — Ambulatory Visit: Payer: Self-pay | Admitting: *Deleted

## 2018-06-05 ENCOUNTER — Other Ambulatory Visit: Payer: Self-pay | Admitting: Internal Medicine

## 2018-06-05 ENCOUNTER — Telehealth: Payer: Self-pay | Admitting: Internal Medicine

## 2018-06-05 DIAGNOSIS — B373 Candidiasis of vulva and vagina: Secondary | ICD-10-CM

## 2018-06-05 DIAGNOSIS — B3731 Acute candidiasis of vulva and vagina: Secondary | ICD-10-CM

## 2018-06-05 MED ORDER — FLUCONAZOLE 150 MG PO TABS: 150.0000 mg | ORAL_TABLET | Freq: Once | ORAL | 0 refills | Status: AC

## 2018-06-05 NOTE — Telephone Encounter (Signed)
Pt called with C/O dark tea colored urine "almost brown" today. Pt is concerned because she has been taking Amoxicillin for an ear infection. She has one more dose to take. Pt denies painful urination, dehydration,back or flank pain. She says she has reoccurring rt lower back pain from lifting her grandchild occasionally. She has been having vaginal itching that the doctor has prescribed medication for  but she has not picked it up yet.  Appointment scheduled per protocol.  Care advice given to patient. Pt verbalized understanding of all instructions.  Reason for Disposition . All other urine symptoms  Answer Assessment - Initial Assessment Questions 1. SYMPTOM: "What's the main symptom you're concerned about?" (e.g., frequency, incontinence)     Dark tea colored urine 2. ONSET: "When did the  This  start?"     This AM 3. PAIN: "Is there any pain?" If so, ask: "How bad is it?" (Scale: 1-10; mild, moderate, severe)     No pain 4. CAUSE: "What do you think is causing the symptoms?"     antibiotic 5. OTHER SYMPTOMS: "Do you have any other symptoms?" (e.g., fever, flank pain, blood in urine, pain with urination)     no 6. PREGNANCY: "Is there any chance you are pregnant?" "When was your last menstrual period?"     n/a  Protocols used: URINARY [facility]

## 2018-06-05 NOTE — Telephone Encounter (Signed)
Copied from CRM [id number]. Topic: Quick Communication - Rx Refill/Question >> Jun 05, 2018 12:11 PM Lyn Hollingshead, [facility] wrote: Medication:  Pt is unsure:  States it was a one dose for a yeast infection  Pt states that she was supposed to take one dose and it was on a card.  Pt states that she has misplaced this and wants to get another.  Preferred Pharmacy (with phone number or street name): [facility] [phone] [place], [place] - [address] [phone] (Phone) [phone] (Fax)  Agent: Please be advised that RX refills may take up to 3 business days. We ask that you follow-up with your pharmacy.

## 2018-06-05 NOTE — Telephone Encounter (Signed)
Please advise 

## 2018-06-05 NOTE — Telephone Encounter (Signed)
Patient was seen by Dr. Shirlee Latch on 05-29-18 and would like an rx for this medication.

## 2018-06-06 ENCOUNTER — Ambulatory Visit: Payer: BLUE CROSS/BLUE SHIELD | Admitting: Family Medicine

## 2018-06-06 ENCOUNTER — Encounter: Payer: Self-pay | Admitting: Family Medicine

## 2018-06-06 VITALS — BP 118/74 | HR 75 | Temp 98.1°F | Ht 62.0 in | Wt 145.4 lb

## 2018-06-06 DIAGNOSIS — R82998 Other abnormal findings in urine: Secondary | ICD-10-CM | POA: Diagnosis not present

## 2018-06-06 LAB — POCT URINALYSIS DIPSTICK
Bilirubin, UA: NEGATIVE
Glucose, UA: NEGATIVE
Ketones, UA: NEGATIVE
LEUKOCYTES UA: NEGATIVE
NITRITE UA: NEGATIVE
PROTEIN UA: NEGATIVE
RBC UA: NEGATIVE
SPEC GRAV UA: 1.02 (ref 1.010–1.025)
Urobilinogen, UA: 0.2 E.U./dL
pH, UA: 6 (ref 5.0–8.0)

## 2018-06-06 NOTE — Progress Notes (Signed)
   Subjective:    Patient ID: Kim Love, female    DOB: [birth year], 62 y.o.   MRN: [id number]  HPI   Patient presents to clinic complaining of one-time episode of dark-colored urine.  States it occurred yesterday in the morning, it was a first time she urinated for the day after waking up.  Denies any painful urination.  Denies any increased urinary urgency or frequency.  States urine looked tea colored.  Denies any abdominal pain.  Denies nausea, vomiting or diarrhea.  States she does drink water throughout the day, but her main source of fluid intake is iced tea.   Patient Active Problem List   Diagnosis Date Noted  . Dermatitis due to plants, including poison ivy, sumac, and oak 12/18/2016  . Sleeping difficulties 03/24/2015  . Health care maintenance 11/28/2014  . Vitamin D deficiency 05/17/2013  . Essential hypertension, benign 03/26/2013  . Bradycardia 03/26/2013  . Hypercholesterolemia 03/26/2013  . Anxiety 03/26/2013   Social History   Tobacco Use  . Smoking status: Former Smoker    Types: Cigarettes    Last attempt to quit: 09/10/2012    Years since quitting: 5.7  . Smokeless tobacco: Current User  Substance Use Topics  . Alcohol use: No    Alcohol/week: 0.0 standard drinks   Review of Systems  Constitutional: Negative for chills, fatigue and fever.  HENT: Negative for congestion, ear pain, sinus pain and sore throat.   Eyes: Negative.   Respiratory: Negative for cough, shortness of breath and wheezing.   Cardiovascular: Negative for chest pain, palpitations and leg swelling.  Gastrointestinal: Negative for abdominal pain, diarrhea, nausea and vomiting.  Genitourinary: dark urine x1 Musculoskeletal: Negative for arthralgias and myalgias.  Skin: Negative for color change, pallor and rash.  Neurological: Negative for syncope, light-headedness and headaches.  Psychiatric/Behavioral: The patient is not nervous/anxious.       Objective:   Physical  Exam  Constitutional:  She appears well-developed and well-nourished. No distress.  Head: Normocephalic and atraumatic.  Eyes:  EOM are normal. No scleral icterus.  Neck: Normal range of motion. Neck supple. No tracheal deviation present.  Cardiovascular: Normal rate, regular rhythm and normal heart sounds.  Pulmonary/Chest: Effort normal and breath sounds normal. No respiratory distress. She has no wheezes. She has no rales.  Abdominal: Soft. Bowel sounds are normal. There is no tenderness.  Neurological: She is alert and oriented to person, place, and time.  Gait normal  Skin: Skin is warm and dry. No pallor.  Psychiatric: She has a normal mood and affect. Her behavior is normal. Thought content normal.   Nursing note and vitals reviewed.      Vitals:   06/06/18 0810  BP: 118/74  Pulse: 75  Temp: 98.1 F (36.7 C)  SpO2: 96%   Assessment & Plan:    Dark urine -- urinalysis in clinic is unremarkable: Color of urine is yellow, it is clear, negative for any glucose, bilirubin, ketones, protein, blood or leukocytes.  Specific gravity is 1.020.  I have offered to order patient some blood work including CBC and CMP to look at electrolytes, liver, kidney functions however patient declines at this time.  Patient advised to increase water intake.  Also advised if dark-colored urine occurs again, I would highly recommend she get blood work to further investigate.  Keep regular follow-up as already scheduled.  Return to clinic sooner if issues arise.

## 2018-07-11 ENCOUNTER — Other Ambulatory Visit: Payer: Self-pay | Admitting: Internal Medicine

## 2018-08-13 ENCOUNTER — Other Ambulatory Visit (INDEPENDENT_AMBULATORY_CARE_PROVIDER_SITE_OTHER): Payer: BLUE CROSS/BLUE SHIELD

## 2018-08-13 ENCOUNTER — Ambulatory Visit (INDEPENDENT_AMBULATORY_CARE_PROVIDER_SITE_OTHER): Payer: BLUE CROSS/BLUE SHIELD | Admitting: *Deleted

## 2018-08-13 DIAGNOSIS — Z23 Encounter for immunization: Secondary | ICD-10-CM | POA: Diagnosis not present

## 2018-08-13 DIAGNOSIS — E559 Vitamin D deficiency, unspecified: Secondary | ICD-10-CM | POA: Diagnosis not present

## 2018-08-13 DIAGNOSIS — E7800 Pure hypercholesterolemia, unspecified: Secondary | ICD-10-CM

## 2018-08-13 DIAGNOSIS — I10 Essential (primary) hypertension: Secondary | ICD-10-CM

## 2018-08-13 LAB — CBC WITH DIFFERENTIAL/PLATELET
BASOS ABS: 0.1 10*3/uL (ref 0.0–0.1)
Basophils Relative: 0.8 % (ref 0.0–3.0)
Eosinophils Absolute: 0.4 10*3/uL (ref 0.0–0.7)
Eosinophils Relative: 5.2 % — ABNORMAL HIGH (ref 0.0–5.0)
HCT: 40.8 % (ref 36.0–46.0)
Hemoglobin: 14.1 g/dL (ref 12.0–15.0)
Lymphocytes Relative: 25.6 % (ref 12.0–46.0)
Lymphs Abs: 1.8 10*3/uL (ref 0.7–4.0)
MCHC: 34.5 g/dL (ref 30.0–36.0)
MCV: 89.2 fl (ref 78.0–100.0)
Monocytes Absolute: 0.5 10*3/uL (ref 0.1–1.0)
Monocytes Relative: 6.5 % (ref 3.0–12.0)
Neutro Abs: 4.4 10*3/uL (ref 1.4–7.7)
Neutrophils Relative %: 61.9 % (ref 43.0–77.0)
Platelets: 264 10*3/uL (ref 150.0–400.0)
RBC: 4.57 Mil/uL (ref 3.87–5.11)
RDW: 13.7 % (ref 11.5–15.5)
WBC: 7.1 10*3/uL (ref 4.0–10.5)

## 2018-08-13 LAB — BASIC METABOLIC PANEL
BUN: 12 mg/dL (ref 6–23)
CO2: 28 meq/L (ref 19–32)
CREATININE: 0.84 mg/dL (ref 0.40–1.20)
Calcium: 8.8 mg/dL (ref 8.4–10.5)
Chloride: 103 mEq/L (ref 96–112)
GFR: 73.21 mL/min (ref >60.00)
GLUCOSE: 93 mg/dL (ref 70–99)
Potassium: 3.6 mEq/L (ref 3.5–5.1)
SODIUM: 139 meq/L (ref 135–145)

## 2018-08-13 LAB — HEPATIC FUNCTION PANEL
ALT: 12 U/L (ref 0–35)
AST: 14 U/L (ref 0–37)
Albumin: 3.9 g/dL (ref 3.5–5.2)
Alkaline Phosphatase: 81 U/L (ref 39–117)
Bilirubin, Direct: 0.1 mg/dL (ref 0.0–0.3)
Total Bilirubin: 0.5 mg/dL (ref 0.2–1.2)
Total Protein: 6.9 g/dL (ref 6.0–8.3)

## 2018-08-13 LAB — TSH: TSH: 1.43 u[IU]/mL (ref 0.35–4.50)

## 2018-08-13 LAB — LIPID PANEL
CHOL/HDL RATIO: 6
Cholesterol: 190 mg/dL (ref 0–200)
HDL: 33.8 mg/dL — ABNORMAL LOW (ref >39.00)
LDL Cholesterol: 125 mg/dL — ABNORMAL HIGH (ref 0–99)
NonHDL: 155.95
TRIGLYCERIDES: 154 mg/dL — AB (ref 0.0–149.0)
VLDL: 30.8 mg/dL (ref 0.0–40.0)

## 2018-08-13 LAB — VITAMIN D 25 HYDROXY (VIT D DEFICIENCY, FRACTURES): VITD: 42.33 ng/mL (ref 30.00–100.00)

## 2018-08-15 ENCOUNTER — Ambulatory Visit: Payer: Self-pay | Admitting: Internal Medicine

## 2018-08-15 DIAGNOSIS — Z0289 Encounter for other administrative examinations: Secondary | ICD-10-CM

## 2018-09-30 ENCOUNTER — Ambulatory Visit: Payer: BLUE CROSS/BLUE SHIELD | Admitting: Internal Medicine

## 2018-09-30 ENCOUNTER — Encounter: Payer: Self-pay | Admitting: Internal Medicine

## 2018-09-30 DIAGNOSIS — E7800 Pure hypercholesterolemia, unspecified: Secondary | ICD-10-CM | POA: Diagnosis not present

## 2018-09-30 DIAGNOSIS — E559 Vitamin D deficiency, unspecified: Secondary | ICD-10-CM

## 2018-09-30 DIAGNOSIS — R5383 Other fatigue: Secondary | ICD-10-CM

## 2018-09-30 DIAGNOSIS — F419 Anxiety disorder, unspecified: Secondary | ICD-10-CM | POA: Diagnosis not present

## 2018-09-30 DIAGNOSIS — I10 Essential (primary) hypertension: Secondary | ICD-10-CM | POA: Diagnosis not present

## 2018-09-30 NOTE — Progress Notes (Signed)
Patient ID: Kim Love, female   DOB: [birth year], 63 y.o.   MRN: [id number]   Subjective:    Patient ID: Kim PacasJudy B Love, female    DOB: [birth year], 63 y.o.   MRN: [id number]  HPI  Patient here for a scheduled follow up.  She reports she is doing relatively well.  Stays active.  Keeping her young grandchild.  Some family stressors.  Overall she feels she is handling things relatively well.  Does not feel needs any further intervention.  Does report fatigue.  She feels related to the above.  No chest pain.  No sob.  No acid reflux.  No abdominal pain.  Bowels moving.  No urine change.  Previously had sinus congestion and right ear pressure.  This has resolved.  Blood pressure doing well.     Past Medical History:  Diagnosis Date  . Anxiety   . Hypercholesterolemia   . Hypertension    Past Surgical History:  Procedure Laterality Date  . TONSILLECTOMY AND ADENOIDECTOMY Bilateral 1980   Family History  Problem Relation Age of Onset  . Stroke Maternal Grandmother    Social History   Socioeconomic History  . Marital status: Married    Spouse name: Not on file  . Number of children: 1  . Years of education: Not on file  . Highest education level: Not on file  Occupational History    Employer: [employer]  Social Needs  . Financial resource strain: Not on file  . Food insecurity:    Worry: Not on file    Inability: Not on file  . Transportation needs:    Medical: Not on file    Non-medical: Not on file  Tobacco Use  . Smoking status: Former Smoker    Types: Cigarettes    Last attempt to quit: 09/10/2012    Years since quitting: 6.0  . Smokeless tobacco: Current User  Substance and Sexual Activity  . Alcohol use: No    Alcohol/week: 0.0 standard drinks  . Drug use: No  . Sexual activity: Not on file  Lifestyle  . Physical activity:    Days per week: Not on file    Minutes per session: Not on file  . Stress: Not on file  Relationships  . Social connections:    Talks on  phone: Not on file    Gets together: Not on file    Attends religious service: Not on file    Active member of club or organization: Not on file    Attends meetings of clubs or organizations: Not on file    Relationship status: Not on file  Other Topics Concern  . Not on file  Social History Narrative  . Not on file    Outpatient Encounter Medications as of 09/30/2018  Medication Sig  . amLODipine (NORVASC) 5 MG tablet Take 1 tablet (5 mg total) by mouth daily.  . Cholecalciferol (VITAMIN D-3 PO) Take by mouth.  . citalopram (CELEXA) 20 MG tablet TAKE 1 TABLET BY MOUTH EVERY DAY  . fluticasone (FLONASE) 50 MCG/ACT nasal spray Place 2 sprays into both nostrils daily.  Marland Kitchen. losartan-hydrochlorothiazide (HYZAAR) 100-25 MG tablet TAKE 1 TABLET BY MOUTH EVERY DAY  . mupirocin ointment (BACTROBAN) 2 % Apply 1 application topically 2 (two) times daily. Use 7-10 days right ear ulceration  . [DISCONTINUED] amoxicillin-clavulanate (AUGMENTIN) 875-125 MG tablet Take 1 tablet by mouth 2 (two) times daily. With food  . [DISCONTINUED] Clobetasol Prop Emollient Base (CLOBETASOL PROPIONATE E) 0.05 %  emollient cream Twice daily to affected areas.  . [DISCONTINUED] loratadine (CLARITIN) 10 MG tablet Take 1 tablet (10 mg total) by mouth daily.   No facility-administered encounter medications on file as of 09/30/2018.     Review of Systems  Constitutional: Positive for fatigue. Negative for appetite change and unexpected weight change.  HENT: Negative for congestion and sinus pressure.   Respiratory: Negative for cough, chest tightness and shortness of breath.   Cardiovascular: Negative for chest pain, palpitations and leg swelling.  Gastrointestinal: Negative for abdominal pain, diarrhea, nausea and vomiting.  Genitourinary: Negative for difficulty urinating and dysuria.  Musculoskeletal: Negative for joint swelling and myalgias.  Skin: Negative for color change and rash.  Neurological: Negative for  dizziness, light-headedness and headaches.  Psychiatric/Behavioral: Negative for agitation and dysphoric mood.       Objective:    Physical Exam Constitutional:      General: She is not in acute distress.    Appearance: Normal appearance.  HENT:     Nose: Nose normal. No congestion.     Mouth/Throat:     Pharynx: No oropharyngeal exudate or posterior oropharyngeal erythema.  Neck:     Musculoskeletal: Neck supple. No muscular tenderness.     Thyroid: No thyromegaly.  Cardiovascular:     Rate and Rhythm: Normal rate and regular rhythm.  Pulmonary:     Effort: No respiratory distress.     Breath sounds: Normal breath sounds. No wheezing.  Abdominal:     General: Bowel sounds are normal.     Palpations: Abdomen is soft.     Tenderness: There is no abdominal tenderness.  Musculoskeletal:        General: No swelling or tenderness.  Lymphadenopathy:     Cervical: No cervical adenopathy.  Skin:    Findings: No erythema or rash.  Neurological:     Mental Status: She is alert.  Psychiatric:        Mood and Affect: Mood normal.        Behavior: Behavior normal.     BP 118/72 (BP Location: Left Arm, Patient Position: Sitting, Cuff Size: Normal)   Pulse 74   Temp 98 F (36.7 C) (Oral)   Resp 18   Ht 5\' 2"  (1.575 m)   Wt 145 lb 12.8 oz (66.1 kg)   SpO2 96%   BMI 26.67 kg/m  Wt Readings from Last 3 Encounters:  09/30/18 145 lb 12.8 oz (66.1 kg)  06/06/18 145 lb 6.4 oz (66 kg)  05/29/18 145 lb 12.8 oz (66.1 kg)     Lab Results  Component Value Date   WBC 7.1 08/13/2018   HGB 14.1 08/13/2018   HCT 40.8 08/13/2018   PLT 264.0 08/13/2018   GLUCOSE 93 08/13/2018   CHOL 190 08/13/2018   TRIG 154.0 (H) 08/13/2018   HDL 33.80 (L) 08/13/2018   LDLDIRECT 129.0 03/26/2018   LDLCALC 125 (H) 08/13/2018   ALT 12 08/13/2018   AST 14 08/13/2018   NA 139 08/13/2018   K 3.6 08/13/2018   CL 103 08/13/2018   CREATININE 0.84 08/13/2018   BUN 12 08/13/2018   CO2 28 08/13/2018    TSH 1.43 08/13/2018       Assessment & Plan:   Problem List Items Addressed This Visit    Anxiety    Doing well on citalopram.  Does not feel needs any further intervention.  Follow.        Essential hypertension, benign    Blood pressure under good  control.  Continue same medication regimen.  Follow pressures.  Follow metabolic panel.        Fatigue    Increased fatigue.  Recent labs discussed.  Check B12.  She desires no further intervention.  Feels rested when wakes up.  Sleeps well, just has to get up early with her husband going to work.  Follow.       Relevant Orders   Vitamin B12 (Completed)   Hypercholesterolemia    Low cholesterol diet and exercise.  Follow lipid panel.       Vitamin D deficiency    Recent vitamin d level wnl.            Dale [place], MD

## 2018-10-02 LAB — VITAMIN B12: Vitamin B-12: 237 pg/mL (ref 211–911)

## 2018-10-06 ENCOUNTER — Encounter: Payer: Self-pay | Admitting: Internal Medicine

## 2018-10-06 NOTE — Assessment & Plan Note (Signed)
Low cholesterol diet and exercise.  Follow lipid panel.   

## 2018-10-06 NOTE — Assessment & Plan Note (Signed)
Recent vitamin d level wnl.

## 2018-10-06 NOTE — Assessment & Plan Note (Signed)
Increased fatigue.  Recent labs discussed.  Check B12.  She desires no further intervention.  Feels rested when wakes up.  Sleeps well, just has to get up early with her husband going to work.  Follow.

## 2018-10-06 NOTE — Assessment & Plan Note (Signed)
Blood pressure under good control.  Continue same medication regimen.  Follow pressures.  Follow metabolic panel.   

## 2018-10-06 NOTE — Assessment & Plan Note (Signed)
Doing well on citalopram.  Does not feel needs any further intervention.  Follow.

## 2018-10-15 ENCOUNTER — Ambulatory Visit (INDEPENDENT_AMBULATORY_CARE_PROVIDER_SITE_OTHER): Payer: BLUE CROSS/BLUE SHIELD

## 2018-10-15 DIAGNOSIS — E538 Deficiency of other specified B group vitamins: Secondary | ICD-10-CM | POA: Diagnosis not present

## 2018-10-15 MED ORDER — CYANOCOBALAMIN 1000 MCG/ML IJ SOLN
1000.0000 ug | Freq: Once | INTRAMUSCULAR | Status: AC
  Administered 2018-10-15: 1000 ug via INTRAMUSCULAR

## 2018-10-15 NOTE — Progress Notes (Addendum)
Patient presents today for b12 injection. Left deltoid. Pt voiced no concerns nor showed any signs of distress during injection.   Reviewed.  Dr Lorin Picket

## 2018-10-23 ENCOUNTER — Ambulatory Visit (INDEPENDENT_AMBULATORY_CARE_PROVIDER_SITE_OTHER): Payer: BLUE CROSS/BLUE SHIELD

## 2018-10-23 DIAGNOSIS — E538 Deficiency of other specified B group vitamins: Secondary | ICD-10-CM

## 2018-10-23 MED ORDER — CYANOCOBALAMIN 1000 MCG/ML IJ SOLN
1000.0000 ug | Freq: Once | INTRAMUSCULAR | Status: AC
  Administered 2018-10-23: 1000 ug via INTRAMUSCULAR

## 2018-10-23 NOTE — Progress Notes (Signed)
Pt was seen today for a NV for B-12 shot given IM in the RD. Pt tolerated well.

## 2018-10-29 ENCOUNTER — Ambulatory Visit (INDEPENDENT_AMBULATORY_CARE_PROVIDER_SITE_OTHER): Payer: BLUE CROSS/BLUE SHIELD

## 2018-10-29 DIAGNOSIS — E538 Deficiency of other specified B group vitamins: Secondary | ICD-10-CM | POA: Diagnosis not present

## 2018-10-29 MED ORDER — CYANOCOBALAMIN 1000 MCG/ML IJ SOLN
1000.0000 ug | Freq: Once | INTRAMUSCULAR | Status: AC
  Administered 2018-10-29: 1000 ug via INTRAMUSCULAR

## 2018-10-29 NOTE — Progress Notes (Addendum)
Patient presented for B 12 injection to left deltoid, patient voiced no concerns nor showed any signs of distress during injection.  Reviewed.  Dr Scott 

## 2018-11-11 ENCOUNTER — Ambulatory Visit (INDEPENDENT_AMBULATORY_CARE_PROVIDER_SITE_OTHER): Payer: BLUE CROSS/BLUE SHIELD

## 2018-11-11 DIAGNOSIS — E538 Deficiency of other specified B group vitamins: Secondary | ICD-10-CM | POA: Diagnosis not present

## 2018-11-11 MED ORDER — CYANOCOBALAMIN 1000 MCG/ML IJ SOLN
1000.0000 ug | Freq: Once | INTRAMUSCULAR | Status: AC
  Administered 2018-11-11: 1000 ug via INTRAMUSCULAR

## 2018-11-11 NOTE — Progress Notes (Addendum)
Patient presents today for b12 injection. Right deltoid. Patient voiced no concerns nor showed any signs of distress during injection.   Reviewed.  Dr Scott 

## 2018-12-01 ENCOUNTER — Other Ambulatory Visit: Payer: Self-pay | Admitting: Internal Medicine

## 2018-12-01 MED ORDER — LOSARTAN POTASSIUM-HCTZ 100-25 MG PO TABS: 1.0000 | ORAL_TABLET | Freq: Every day | ORAL | 1 refills | Status: DC

## 2018-12-01 MED ORDER — CITALOPRAM HYDROBROMIDE 20 MG PO TABS: ORAL_TABLET | ORAL | 1 refills | Status: DC

## 2018-12-01 MED ORDER — AMLODIPINE BESYLATE 5 MG PO TABS: 5.0000 mg | ORAL_TABLET | Freq: Every day | ORAL | 1 refills | Status: DC

## 2018-12-01 NOTE — Telephone Encounter (Signed)
Patient called requesting refills of amlodipine, losartan and citalopram.  Refills sent to [facility], left message informing patient.

## 2018-12-03 ENCOUNTER — Telehealth: Payer: Self-pay | Admitting: Internal Medicine

## 2018-12-03 NOTE — Telephone Encounter (Signed)
Received call on voicemail with the number from the call originating being [phone] (mobile). The person identified themselves as Shifra and gave her date of birth. It was asked to receive 3 month supply of prescription which per chart was already sent. I called [phone] (mobile) to see if patient was aware of this and to have her call the pharmacy. The woman who answered stated she was not Darel Hong and hung up.

## 2018-12-05 ENCOUNTER — Ambulatory Visit: Payer: Self-pay | Admitting: Internal Medicine

## 2018-12-07 ENCOUNTER — Other Ambulatory Visit: Payer: Self-pay | Admitting: Internal Medicine

## 2019-01-09 ENCOUNTER — Ambulatory Visit: Payer: Self-pay | Admitting: Internal Medicine

## 2019-01-09 ENCOUNTER — Other Ambulatory Visit: Payer: Self-pay

## 2019-01-09 ENCOUNTER — Ambulatory Visit (INDEPENDENT_AMBULATORY_CARE_PROVIDER_SITE_OTHER): Payer: BLUE CROSS/BLUE SHIELD | Admitting: Internal Medicine

## 2019-01-09 ENCOUNTER — Encounter: Payer: Self-pay | Admitting: Internal Medicine

## 2019-01-09 DIAGNOSIS — F419 Anxiety disorder, unspecified: Secondary | ICD-10-CM | POA: Diagnosis not present

## 2019-01-09 DIAGNOSIS — I10 Essential (primary) hypertension: Secondary | ICD-10-CM

## 2019-01-09 DIAGNOSIS — E559 Vitamin D deficiency, unspecified: Secondary | ICD-10-CM | POA: Diagnosis not present

## 2019-01-09 DIAGNOSIS — E538 Deficiency of other specified B group vitamins: Secondary | ICD-10-CM

## 2019-01-09 DIAGNOSIS — E7800 Pure hypercholesterolemia, unspecified: Secondary | ICD-10-CM | POA: Diagnosis not present

## 2019-01-09 NOTE — Progress Notes (Signed)
Patient ID: Kim PacasJudy B Love, female   DOB: 1955-12-15, 63 y.o.   MRN: [id number] Virtual Visit via Video Note  This visit type was conducted due to national recommendations for restrictions regarding the COVID-19 pandemic (e.g. social distancing).  This format is felt to be most appropriate for this patient at this time.  All issues noted in this document were discussed and addressed.  No physical exam was performed (except for noted visual exam findings with Video Visits).   I connected with Kim Love on 01/09/19 at 10:00 AM EDT by a video enabled telemedicine application or telephone and verified that I am speaking with the correct person using two identifiers. Location patient: home Location provider: work  Persons participating in the virtual visit: patient, provider  I discussed the limitations, risks, security and privacy concerns of performing an evaluation and management service by video and the availability of in person appointments.  The patient expressed understanding and agreed to proceed.   Reason for visit: scheduled follow up.   HPI: She reports she is doing relatively well.  Staying active.  No chest pain. No sob. No acid reflux. No abdominal pain.  Bowels moving. No urine change.  Taking citalopram.  Feels is working well.  Does not feel needs anything more at this time.  Blood pressure doing well.  Discussed diet and exercise.  Discussed taking B12 - oral.  She prefers this instead of coming into the office (for now).  She is trying to stay in.  No known COVID exposure.  No fever.  No chest congestion or cough.     ROS: See pertinent positives and negatives per HPI.  Past Medical History:  Diagnosis Date  . Anxiety   . Hypercholesterolemia   . Hypertension     Past Surgical History:  Procedure Laterality Date  . TONSILLECTOMY AND ADENOIDECTOMY Bilateral 1980    Family History  Problem Relation Age of Onset  . Stroke Maternal Grandmother     SOCIAL HX: reviewed.     Current Outpatient Medications:  .  amLODipine (NORVASC) 5 MG tablet, Take 1 tablet (5 mg total) by mouth daily., Disp: 90 tablet, Rfl: 1 .  Cholecalciferol (VITAMIN D-3 PO), Take by mouth., Disp: , Rfl:  .  citalopram (CELEXA) 20 MG tablet, TAKE 1 TABLET BY MOUTH EVERY DAY, Disp: 90 tablet, Rfl: 1 .  fluticasone (FLONASE) 50 MCG/ACT nasal spray, Place 2 sprays into both nostrils daily., Disp: 16 g, Rfl: 6 .  losartan-hydrochlorothiazide (HYZAAR) 100-25 MG tablet, Take 1 tablet by mouth daily., Disp: 90 tablet, Rfl: 1  EXAM:  GENERAL: alert, oriented, appears well and in no acute distress  HEENT: atraumatic, conjunttiva clear, no obvious abnormalities on inspection of external nose and ears  NECK: normal movements of the head and neck  LUNGS: on inspection no signs of respiratory distress, breathing rate appears normal, no obvious gross SOB, gasping or wheezing  CV: no obvious cyanosis  PSYCH/NEURO: pleasant and cooperative, no obvious depression or anxiety, speech and thought processing grossly intact  ASSESSMENT AND PLAN:  Discussed the following assessment and plan:  Anxiety  Essential hypertension, benign - Plan: Basic metabolic panel  Hypercholesterolemia - Plan: Lipid panel, Hepatic function panel  Vitamin D deficiency  B12 deficiency  Anxiety Doing well on citalopram.  Does not feel she needs anything more at this time.  Follow.    Essential hypertension, benign Blood pressure has been doing well.  Continue same medication.  Follow pressures.  Follow metabolic panel.  Hypercholesterolemia Low cholesterol diet and exercise.  Follow lipid panel.   Vitamin D deficiency Follow vitamin D level.    B12 deficiency Oral B12 - q day.  Follow.     I discussed the assessment and treatment plan with the patient. The patient was provided an opportunity to ask questions and all were answered. The patient agreed with the plan and demonstrated an understanding  of the instructions.   The patient was advised to call back or seek an in-person evaluation if the symptoms worsen or if the condition fails to improve as anticipated.    Dale [place], MD

## 2019-01-11 ENCOUNTER — Encounter: Payer: Self-pay | Admitting: Internal Medicine

## 2019-01-11 DIAGNOSIS — E538 Deficiency of other specified B group vitamins: Secondary | ICD-10-CM | POA: Insufficient documentation

## 2019-01-11 NOTE — Assessment & Plan Note (Signed)
Blood pressure has been doing well.  Continue same medication.  Follow pressures.  Follow metabolic panel.  

## 2019-01-11 NOTE — Assessment & Plan Note (Signed)
Oral B12 - q day.  Follow.

## 2019-01-11 NOTE — Assessment & Plan Note (Signed)
Follow vitamin D level.  

## 2019-01-11 NOTE — Assessment & Plan Note (Signed)
Doing well on citalopram.  Does not feel she needs anything more at this time.  Follow.

## 2019-01-11 NOTE — Assessment & Plan Note (Signed)
Low cholesterol diet and exercise.  Follow lipid panel.   

## 2019-03-10 ENCOUNTER — Other Ambulatory Visit (INDEPENDENT_AMBULATORY_CARE_PROVIDER_SITE_OTHER): Payer: BC Managed Care – PPO

## 2019-03-10 ENCOUNTER — Other Ambulatory Visit: Payer: Self-pay

## 2019-03-10 DIAGNOSIS — I10 Essential (primary) hypertension: Secondary | ICD-10-CM | POA: Diagnosis not present

## 2019-03-10 DIAGNOSIS — E7800 Pure hypercholesterolemia, unspecified: Secondary | ICD-10-CM | POA: Diagnosis not present

## 2019-03-10 LAB — HEPATIC FUNCTION PANEL
ALT: 10 U/L (ref 0–35)
AST: 12 U/L (ref 0–37)
Albumin: 4 g/dL (ref 3.5–5.2)
Alkaline Phosphatase: 83 U/L (ref 39–117)
Bilirubin, Direct: 0.1 mg/dL (ref 0.0–0.3)
Total Bilirubin: 0.6 mg/dL (ref 0.2–1.2)
Total Protein: 6.6 g/dL (ref 6.0–8.3)

## 2019-03-10 LAB — BASIC METABOLIC PANEL
BUN: 9 mg/dL (ref 6–23)
CO2: 30 mEq/L (ref 19–32)
Calcium: 8.6 mg/dL (ref 8.4–10.5)
Chloride: 102 mEq/L (ref 96–112)
Creatinine, Ser: 0.8 mg/dL (ref 0.40–1.20)
GFR: 72.73 mL/min (ref >60.00)
Glucose, Bld: 87 mg/dL (ref 70–99)
Potassium: 3.8 mEq/L (ref 3.5–5.1)
Sodium: 139 mEq/L (ref 135–145)

## 2019-03-10 LAB — LIPID PANEL
Cholesterol: 185 mg/dL (ref 0–200)
HDL: 39.2 mg/dL (ref >39.00)
LDL Cholesterol: 121 mg/dL — ABNORMAL HIGH (ref 0–99)
NonHDL: 145.83
Total CHOL/HDL Ratio: 5
Triglycerides: 123 mg/dL (ref 0.0–149.0)
VLDL: 24.6 mg/dL (ref 0.0–40.0)

## 2019-03-12 ENCOUNTER — Ambulatory Visit: Payer: BLUE CROSS/BLUE SHIELD | Admitting: Internal Medicine

## 2019-03-17 ENCOUNTER — Ambulatory Visit (INDEPENDENT_AMBULATORY_CARE_PROVIDER_SITE_OTHER): Payer: BC Managed Care – PPO | Admitting: Internal Medicine

## 2019-03-17 ENCOUNTER — Encounter: Payer: Self-pay | Admitting: Internal Medicine

## 2019-03-17 ENCOUNTER — Other Ambulatory Visit: Payer: Self-pay

## 2019-03-17 VITALS — BP 118/68 | HR 76 | Temp 98.7°F | Resp 16 | Ht 63.0 in | Wt 144.0 lb

## 2019-03-17 DIAGNOSIS — Z1211 Encounter for screening for malignant neoplasm of colon: Secondary | ICD-10-CM | POA: Diagnosis not present

## 2019-03-17 DIAGNOSIS — F419 Anxiety disorder, unspecified: Secondary | ICD-10-CM | POA: Diagnosis not present

## 2019-03-17 DIAGNOSIS — Z0000 Encounter for general adult medical examination without abnormal findings: Secondary | ICD-10-CM | POA: Diagnosis not present

## 2019-03-17 DIAGNOSIS — E538 Deficiency of other specified B group vitamins: Secondary | ICD-10-CM | POA: Diagnosis not present

## 2019-03-17 DIAGNOSIS — I10 Essential (primary) hypertension: Secondary | ICD-10-CM

## 2019-03-17 DIAGNOSIS — E559 Vitamin D deficiency, unspecified: Secondary | ICD-10-CM

## 2019-03-17 DIAGNOSIS — E7800 Pure hypercholesterolemia, unspecified: Secondary | ICD-10-CM

## 2019-03-17 MED ORDER — HYDROCORT-PRAMOXINE (PERIANAL) 2.5-1 % EX CREA: 1.0000 "application " | TOPICAL_CREAM | Freq: Two times a day (BID) | CUTANEOUS | 0 refills | Status: DC | PRN

## 2019-03-17 MED ORDER — CITALOPRAM HYDROBROMIDE 20 MG PO TABS: 20.0000 mg | ORAL_TABLET | Freq: Every day | ORAL | 1 refills | Status: DC

## 2019-03-17 MED ORDER — AMLODIPINE BESYLATE 5 MG PO TABS: 5.0000 mg | ORAL_TABLET | Freq: Every day | ORAL | 3 refills | Status: DC

## 2019-03-17 MED ORDER — LOSARTAN POTASSIUM-HCTZ 100-25 MG PO TABS: 1.0000 | ORAL_TABLET | Freq: Every day | ORAL | 3 refills | Status: DC

## 2019-03-17 NOTE — Assessment & Plan Note (Addendum)
Physical today 03/17/19.  Sees Dr Freda Munro.  PAP 08/09/14 - negative.  Mammogram 11/27/17 - Birads I.  Colonoscopy 02/16/15 as outlined in overview.  GYN to schedule f/u mammogram.

## 2019-03-17 NOTE — Progress Notes (Signed)
Patient ID: Kim Love, female   DOB: [birth year], 63 y.o.   MRN: [id number]   Subjective:    Patient ID: Kim Love, female    DOB: [birth year], 63 y.o.   MRN: [id number]  HPI  Patient here for her physical exam.  She reports she is doing well.  Handling stress relatively well.  Taking citalopram.  Feels this is working well for her.  Stays active.  No chest pain.  No sob.  No acid reflux.  No abdominal pain.  Bowels moving.  Discussed recent labs.  Calculated cholesterol risk - 4.9%.  Discussed diet and exercise.     Past Medical History:  Diagnosis Date  . Anxiety   . Hypercholesterolemia   . Hypertension    Past Surgical History:  Procedure Laterality Date  . TONSILLECTOMY AND ADENOIDECTOMY Bilateral 1980   Family History  Problem Relation Age of Onset  . Stroke Maternal Grandmother    Social History   Socioeconomic History  . Marital status: Married    Spouse name: Not on file  . Number of children: 1  . Years of education: Not on file  . Highest education level: Not on file  Occupational History    Employer: [employer]  Social Needs  . Financial resource strain: Not on file  . Food insecurity    Worry: Not on file    Inability: Not on file  . Transportation needs    Medical: Not on file    Non-medical: Not on file  Tobacco Use  . Smoking status: Former Smoker    Types: Cigarettes    Quit date: 09/10/2012    Years since quitting: 6.5  . Smokeless tobacco: Current User  Substance and Sexual Activity  . Alcohol use: No    Alcohol/week: 0.0 standard drinks  . Drug use: No  . Sexual activity: Not on file  Lifestyle  . Physical activity    Days per week: Not on file    Minutes per session: Not on file  . Stress: Not on file  Relationships  . Social Musicianconnections    Talks on phone: Not on file    Gets together: Not on file    Attends religious service: Not on file    Active member of club or organization: Not on file    Attends meetings of clubs or  organizations: Not on file    Relationship status: Not on file  Other Topics Concern  . Not on file  Social History Narrative  . Not on file    Outpatient Encounter Medications as of 03/17/2019  Medication Sig  . amLODipine (NORVASC) 5 MG tablet Take 1 tablet (5 mg total) by mouth daily.  . Cholecalciferol (VITAMIN D-3 PO) Take by mouth.  . citalopram (CELEXA) 20 MG tablet Take 1 tablet (20 mg total) by mouth daily.  . fluticasone (FLONASE) 50 MCG/ACT nasal spray Place 2 sprays into both nostrils daily.  . hydrocortisone-pramoxine (ANALPRAM HC) 2.5-1 % rectal cream Place 1 application rectally 2 (two) times daily as needed for hemorrhoids or anal itching.  . losartan-hydrochlorothiazide (HYZAAR) 100-25 MG tablet Take 1 tablet by mouth daily.  . [DISCONTINUED] amLODipine (NORVASC) 5 MG tablet Take 1 tablet (5 mg total) by mouth daily.  . [DISCONTINUED] citalopram (CELEXA) 20 MG tablet TAKE 1 TABLET BY MOUTH EVERY DAY  . [DISCONTINUED] losartan-hydrochlorothiazide (HYZAAR) 100-25 MG tablet Take 1 tablet by mouth daily.   No facility-administered encounter medications on file as of 03/17/2019.  Review of Systems  Constitutional: Negative for appetite change and unexpected weight change.  HENT: Negative for congestion and sinus pressure.   Eyes: Negative for pain and visual disturbance.  Respiratory: Negative for cough, chest tightness and shortness of breath.   Cardiovascular: Negative for chest pain, palpitations and leg swelling.  Gastrointestinal: Negative for abdominal pain, diarrhea, nausea and vomiting.  Genitourinary: Negative for difficulty urinating and dysuria.  Musculoskeletal: Negative for joint swelling and myalgias.  Skin: Negative for color change and rash.  Neurological: Negative for dizziness, light-headedness and headaches.  Hematological: Negative for adenopathy. Does not bruise/bleed easily.  Psychiatric/Behavioral: Negative for agitation and dysphoric mood.        Objective:    Physical Exam Constitutional:      General: She is not in acute distress.    Appearance: Normal appearance. She is well-developed.  HENT:     Right Ear: External ear normal.     Left Ear: External ear normal.  Eyes:     General: No scleral icterus.       Right eye: No discharge.        Left eye: No discharge.     Conjunctiva/sclera: Conjunctivae normal.  Neck:     Musculoskeletal: Neck supple. No muscular tenderness.     Thyroid: No thyromegaly.  Cardiovascular:     Rate and Rhythm: Normal rate and regular rhythm.  Pulmonary:     Effort: No tachypnea, accessory muscle usage or respiratory distress.     Breath sounds: Normal breath sounds. No decreased breath sounds or wheezing.  Chest:     Breasts:        Right: No inverted nipple, mass, nipple discharge or tenderness (no axillary adenopathy).        Left: No inverted nipple, mass, nipple discharge or tenderness (no axilarry adenopathy).  Abdominal:     General: Bowel sounds are normal.     Palpations: Abdomen is soft.     Tenderness: There is no abdominal tenderness.  Musculoskeletal:        General: No swelling or tenderness.  Lymphadenopathy:     Cervical: No cervical adenopathy.  Skin:    Findings: No erythema or rash.  Neurological:     Mental Status: She is alert and oriented to person, place, and time.  Psychiatric:        Mood and Affect: Mood normal.        Behavior: Behavior normal.     BP 118/68   Pulse 76   Temp 98.7 F (37.1 C) (Oral)   Resp 16   Ht 5\' 3"  (1.6 m)   Wt 144 lb (65.3 kg)   SpO2 97%   BMI 25.51 kg/m  Wt Readings from Last 3 Encounters:  03/17/19 144 lb (65.3 kg)  09/30/18 145 lb 12.8 oz (66.1 kg)  06/06/18 145 lb 6.4 oz (66 kg)     Lab Results  Component Value Date   WBC 7.1 08/13/2018   HGB 14.1 08/13/2018   HCT 40.8 08/13/2018   PLT 264.0 08/13/2018   GLUCOSE 87 03/10/2019   CHOL 185 03/10/2019   TRIG 123.0 03/10/2019   HDL 39.20 03/10/2019   LDLDIRECT  129.0 03/26/2018   LDLCALC 121 (H) 03/10/2019   ALT 10 03/10/2019   AST 12 03/10/2019   NA 139 03/10/2019   K 3.8 03/10/2019   CL 102 03/10/2019   CREATININE 0.80 03/10/2019   BUN 9 03/10/2019   CO2 30 03/10/2019   TSH 1.43 08/13/2018  Assessment & Plan:   Problem List Items Addressed This Visit    Anxiety    Doing well on citalopram.  Does not feel needs anything more at this time.  Follow.        Relevant Medications   citalopram (CELEXA) 20 MG tablet   B12 deficiency    Continue oral B12.        Essential hypertension, benign    Blood pressure under good control.  Continue same medication regimen.  Follow pressures.  Follow metabolic panel.        Relevant Medications   amLODipine (NORVASC) 5 MG tablet   losartan-hydrochlorothiazide (HYZAAR) 100-25 MG tablet   Health care maintenance    Physical today 03/17/19.  Sees Dr Freda Munro.  PAP 08/09/14 - negative.  Mammogram 11/27/17 - Birads I.  Colonoscopy 02/16/15 as outlined in overview.  GYN to schedule f/u mammogram.       Hypercholesterolemia    Low cholesterol diet and exercise.  Follow lipid panel.        Relevant Medications   amLODipine (NORVASC) 5 MG tablet   losartan-hydrochlorothiazide (HYZAAR) 100-25 MG tablet   Vitamin D deficiency    Follow vitamin D level.         Other Visit Diagnoses    Colon cancer screening    -  Primary   Relevant Orders   Fecal occult blood, imunochemical       Einar Pheasant, MD

## 2019-03-22 ENCOUNTER — Encounter: Payer: Self-pay | Admitting: Internal Medicine

## 2019-03-22 NOTE — Assessment & Plan Note (Signed)
Doing well on citalopram.  Does not feel needs anything more at this time.  Follow.

## 2019-03-22 NOTE — Assessment & Plan Note (Signed)
Follow vitamin D level.  

## 2019-03-22 NOTE — Assessment & Plan Note (Signed)
Blood pressure under good control.  Continue same medication regimen.  Follow pressures.  Follow metabolic panel.   

## 2019-03-22 NOTE — Assessment & Plan Note (Signed)
Low cholesterol diet and exercise.  Follow lipid panel.   

## 2019-03-22 NOTE — Assessment & Plan Note (Signed)
Continue oral B12.  

## 2019-06-30 ENCOUNTER — Ambulatory Visit (INDEPENDENT_AMBULATORY_CARE_PROVIDER_SITE_OTHER): Payer: BC Managed Care – PPO

## 2019-06-30 ENCOUNTER — Encounter (INDEPENDENT_AMBULATORY_CARE_PROVIDER_SITE_OTHER): Payer: Self-pay

## 2019-06-30 ENCOUNTER — Other Ambulatory Visit: Payer: Self-pay

## 2019-06-30 DIAGNOSIS — Z23 Encounter for immunization: Secondary | ICD-10-CM

## 2019-07-29 ENCOUNTER — Ambulatory Visit: Payer: BC Managed Care – PPO | Admitting: Internal Medicine

## 2019-07-29 ENCOUNTER — Other Ambulatory Visit: Payer: Self-pay

## 2019-07-29 DIAGNOSIS — I10 Essential (primary) hypertension: Secondary | ICD-10-CM

## 2019-07-29 DIAGNOSIS — E7800 Pure hypercholesterolemia, unspecified: Secondary | ICD-10-CM

## 2019-07-29 DIAGNOSIS — E559 Vitamin D deficiency, unspecified: Secondary | ICD-10-CM

## 2019-07-29 DIAGNOSIS — F419 Anxiety disorder, unspecified: Secondary | ICD-10-CM

## 2019-07-29 DIAGNOSIS — E538 Deficiency of other specified B group vitamins: Secondary | ICD-10-CM | POA: Diagnosis not present

## 2019-07-29 NOTE — Progress Notes (Signed)
Patient ID: Kim Love, female   DOB: 11-Sep-1955, 63 y.o.   MRN: [id number]   Subjective:    Patient ID: Kim Love, female    DOB: 1955-11-14, 63 y.o.   MRN: [id number]  HPI  Patient here for a scheduled follow up.  She reports she is doing relatively well.  Increased stress.  Mother-n-law living with her.  Overall she feels she feels she is handling things relatively well.  No chest pain.  No sob.  Tries to stay active.  No acid reflux.  No abdominal pain.  Bowels moving.  Blood pressure doing well. Discussed the need for mammogram.  She will schedule.     Past Medical History:  Diagnosis Date  . Anxiety   . Hypercholesterolemia   . Hypertension    Past Surgical History:  Procedure Laterality Date  . TONSILLECTOMY AND ADENOIDECTOMY Bilateral 1980   Family History  Problem Relation Age of Onset  . Stroke Maternal Grandmother    Social History   Socioeconomic History  . Marital status: Married    Spouse name: Not on file  . Number of children: 1  . Years of education: Not on file  . Highest education level: Not on file  Occupational History    Employer: [employer]  Social Needs  . Financial resource strain: Not on file  . Food insecurity    Worry: Not on file    Inability: Not on file  . Transportation needs    Medical: Not on file    Non-medical: Not on file  Tobacco Use  . Smoking status: Former Smoker    Types: Cigarettes    Quit date: 09/10/2012    Years since quitting: 6.8  . Smokeless tobacco: Current User  Substance and Sexual Activity  . Alcohol use: No    Alcohol/week: 0.0 standard drinks  . Drug use: No  . Sexual activity: Not on file  Lifestyle  . Physical activity    Days per week: Not on file    Minutes per session: Not on file  . Stress: Not on file  Relationships  . Social Herbalist on phone: Not on file    Gets together: Not on file    Attends religious service: Not on file    Active member of club or organization: Not on  file    Attends meetings of clubs or organizations: Not on file    Relationship status: Not on file  Other Topics Concern  . Not on file  Social History Narrative  . Not on file    Outpatient Encounter Medications as of 07/29/2019  Medication Sig  . amLODipine (NORVASC) 5 MG tablet Take 1 tablet (5 mg total) by mouth daily.  . Cholecalciferol (VITAMIN D-3 PO) Take by mouth.  . citalopram (CELEXA) 20 MG tablet Take 1 tablet (20 mg total) by mouth daily.  . fluticasone (FLONASE) 50 MCG/ACT nasal spray Place 2 sprays into both nostrils daily.  . hydrocortisone-pramoxine (ANALPRAM HC) 2.5-1 % rectal cream Place 1 application rectally 2 (two) times daily as needed for hemorrhoids or anal itching.  . losartan-hydrochlorothiazide (HYZAAR) 100-25 MG tablet Take 1 tablet by mouth daily.   No facility-administered encounter medications on file as of 07/29/2019.    Review of Systems  Constitutional: Negative for appetite change and unexpected weight change.  HENT: Negative for congestion and sinus pressure.   Respiratory: Negative for cough, chest tightness and shortness of breath.   Cardiovascular: Negative for  chest pain, palpitations and leg swelling.  Gastrointestinal: Negative for abdominal pain, diarrhea, nausea and vomiting.  Genitourinary: Negative for difficulty urinating and dysuria.  Musculoskeletal: Negative for joint swelling and myalgias.  Skin: Negative for color change and rash.  Neurological: Negative for dizziness, light-headedness and headaches.  Psychiatric/Behavioral: Negative for agitation and dysphoric mood.       Objective:    Physical Exam Constitutional:      General: She is not in acute distress.    Appearance: Normal appearance.  HENT:     Head: Normocephalic and atraumatic.     Right Ear: External ear normal.     Left Ear: External ear normal.  Eyes:     General: No scleral icterus.       Right eye: No discharge.        Left eye: No discharge.      Conjunctiva/sclera: Conjunctivae normal.  Neck:     Musculoskeletal: Neck supple. No muscular tenderness.     Thyroid: No thyromegaly.  Cardiovascular:     Rate and Rhythm: Normal rate and regular rhythm.  Pulmonary:     Effort: No respiratory distress.     Breath sounds: Normal breath sounds. No wheezing.  Abdominal:     General: Bowel sounds are normal.     Palpations: Abdomen is soft.     Tenderness: There is no abdominal tenderness.  Musculoskeletal:        General: No swelling or tenderness.  Lymphadenopathy:     Cervical: No cervical adenopathy.  Skin:    Findings: No erythema or rash.  Neurological:     Mental Status: She is alert.  Psychiatric:        Mood and Affect: Mood normal.        Behavior: Behavior normal.     BP 130/78   Pulse 75   Temp (!) 97.4 F (36.3 C)   Resp 16   Wt 144 lb (65.3 kg)   SpO2 97%   BMI 25.51 kg/m  Wt Readings from Last 3 Encounters:  07/29/19 144 lb (65.3 kg)  03/17/19 144 lb (65.3 kg)  09/30/18 145 lb 12.8 oz (66.1 kg)     Lab Results  Component Value Date   WBC 7.1 08/13/2018   HGB 14.1 08/13/2018   HCT 40.8 08/13/2018   PLT 264.0 08/13/2018   GLUCOSE 87 03/10/2019   CHOL 185 03/10/2019   TRIG 123.0 03/10/2019   HDL 39.20 03/10/2019   LDLDIRECT 129.0 03/26/2018   LDLCALC 121 (H) 03/10/2019   ALT 10 03/10/2019   AST 12 03/10/2019   NA 139 03/10/2019   K 3.8 03/10/2019   CL 102 03/10/2019   CREATININE 0.80 03/10/2019   BUN 9 03/10/2019   CO2 30 03/10/2019   TSH 1.43 08/13/2018       Assessment & Plan:   Problem List Items Addressed This Visit    Anxiety    Doing well on citalopram.  Feels stable.  Follow.        B12 deficiency    Continue oral B12.       Essential hypertension, benign    Blood pressure under good control.  Continue same medication regimen.  Follow pressures.  Follow metabolic panel.        Relevant Orders   CBC with Differential/Platelet   TSH   Basic metabolic panel (future)    Hypercholesterolemia    Low cholesterol diet and exercise.  Follow lipid panel.       Relevant  Orders   Hepatic function panel   Lipid panel   Vitamin D deficiency    Follow vitamin D level.           Dale [place], MD

## 2019-08-02 ENCOUNTER — Encounter: Payer: Self-pay | Admitting: Internal Medicine

## 2019-08-02 NOTE — Assessment & Plan Note (Signed)
Follow vitamin D level.  

## 2019-08-02 NOTE — Assessment & Plan Note (Signed)
Blood pressure under good control.  Continue same medication regimen.  Follow pressures.  Follow metabolic panel.   

## 2019-08-02 NOTE — Assessment & Plan Note (Signed)
Continue oral B12.  

## 2019-08-02 NOTE — Assessment & Plan Note (Signed)
Doing well on citalopram.  Feels stable.  Follow.

## 2019-08-02 NOTE — Assessment & Plan Note (Signed)
Low cholesterol diet and exercise.  Follow lipid panel.   

## 2019-09-01 ENCOUNTER — Other Ambulatory Visit: Payer: Self-pay

## 2019-09-01 ENCOUNTER — Other Ambulatory Visit (INDEPENDENT_AMBULATORY_CARE_PROVIDER_SITE_OTHER): Payer: BC Managed Care – PPO

## 2019-09-01 DIAGNOSIS — E7800 Pure hypercholesterolemia, unspecified: Secondary | ICD-10-CM | POA: Diagnosis not present

## 2019-09-01 DIAGNOSIS — I10 Essential (primary) hypertension: Secondary | ICD-10-CM | POA: Diagnosis not present

## 2019-09-01 LAB — BASIC METABOLIC PANEL
BUN: 12 mg/dL (ref 6–23)
CO2: 29 mEq/L (ref 19–32)
Calcium: 8.9 mg/dL (ref 8.4–10.5)
Chloride: 103 mEq/L (ref 96–112)
Creatinine, Ser: 0.82 mg/dL (ref 0.40–1.20)
GFR: 70.57 mL/min (ref >60.00)
Glucose, Bld: 86 mg/dL (ref 70–99)
Potassium: 3.5 mEq/L (ref 3.5–5.1)
Sodium: 140 mEq/L (ref 135–145)

## 2019-09-01 LAB — CBC WITH DIFFERENTIAL/PLATELET
Basophils Absolute: 0.1 10*3/uL (ref 0.0–0.1)
Basophils Relative: 1.1 % (ref 0.0–3.0)
Eosinophils Absolute: 0.4 10*3/uL (ref 0.0–0.7)
Eosinophils Relative: 6.4 % — ABNORMAL HIGH (ref 0.0–5.0)
HCT: 42.7 % (ref 36.0–46.0)
Hemoglobin: 14.4 g/dL (ref 12.0–15.0)
Lymphocytes Relative: 22.1 % (ref 12.0–46.0)
Lymphs Abs: 1.5 10*3/uL (ref 0.7–4.0)
MCHC: 33.6 g/dL (ref 30.0–36.0)
MCV: 90.2 fl (ref 78.0–100.0)
Monocytes Absolute: 0.4 10*3/uL (ref 0.1–1.0)
Monocytes Relative: 6.6 % (ref 3.0–12.0)
Neutro Abs: 4.2 10*3/uL (ref 1.4–7.7)
Neutrophils Relative %: 63.8 % (ref 43.0–77.0)
Platelets: 285 10*3/uL (ref 150.0–400.0)
RBC: 4.73 Mil/uL (ref 3.87–5.11)
RDW: 13.2 % (ref 11.5–15.5)
WBC: 6.6 10*3/uL (ref 4.0–10.5)

## 2019-09-01 LAB — LIPID PANEL
Cholesterol: 211 mg/dL — ABNORMAL HIGH (ref 0–200)
HDL: 39.6 mg/dL (ref >39.00)
LDL Cholesterol: 151 mg/dL — ABNORMAL HIGH (ref 0–99)
NonHDL: 171.21
Total CHOL/HDL Ratio: 5
Triglycerides: 103 mg/dL (ref 0.0–149.0)
VLDL: 20.6 mg/dL (ref 0.0–40.0)

## 2019-09-01 LAB — HEPATIC FUNCTION PANEL
ALT: 10 U/L (ref 0–35)
AST: 13 U/L (ref 0–37)
Albumin: 4.1 g/dL (ref 3.5–5.2)
Alkaline Phosphatase: 78 U/L (ref 39–117)
Bilirubin, Direct: 0.1 mg/dL (ref 0.0–0.3)
Total Bilirubin: 0.6 mg/dL (ref 0.2–1.2)
Total Protein: 7.1 g/dL (ref 6.0–8.3)

## 2019-09-01 LAB — TSH: TSH: 1.44 u[IU]/mL (ref 0.35–4.50)

## 2019-09-23 ENCOUNTER — Other Ambulatory Visit: Payer: Self-pay | Admitting: Internal Medicine

## 2019-09-30 ENCOUNTER — Ambulatory Visit (INDEPENDENT_AMBULATORY_CARE_PROVIDER_SITE_OTHER): Payer: BC Managed Care – PPO | Admitting: Internal Medicine

## 2019-09-30 ENCOUNTER — Encounter: Payer: Self-pay | Admitting: Internal Medicine

## 2019-09-30 ENCOUNTER — Other Ambulatory Visit: Payer: Self-pay

## 2019-09-30 DIAGNOSIS — J329 Chronic sinusitis, unspecified: Secondary | ICD-10-CM | POA: Diagnosis not present

## 2019-09-30 MED ORDER — AMOXICILLIN-POT CLAVULANATE 875-125 MG PO TABS: 1.0000 | ORAL_TABLET | Freq: Two times a day (BID) | ORAL | 0 refills | Status: DC

## 2019-09-30 NOTE — Progress Notes (Signed)
Patient ID: Kim Love, female   DOB: [birth year], 64 y.o.   MRN: [id number]   Virtual Visit via video Note  This visit type was conducted due to national recommendations for restrictions regarding the COVID-19 pandemic (e.g. social distancing).  This format is felt to be most appropriate for this patient at this time.  All issues noted in this document were discussed and addressed.  No physical exam was performed (except for noted visual exam findings with Video Visits).   I connected with Ranae Pila by a video enabled telemedicine application and verified that I am speaking with the correct person using two identifiers. Location patient: home Location provider: work  Persons participating in the virtual visit: patient, provider  The limitations, risks, security and privacy concerns of performing an evaluation and management service by video and the availability of in person appointments have been discussed. The patient expressed understanding and agreed to proceed.   Reason for visit: work in appt  HPI: Work in appt with concerns regarding sinus infection.  States symptoms started yesterday.  May have noticed minimal symptoms several days prior, but increased yesterday.  Increased sinus pressure and drainage.  Notices sensation into right ear.  Some headache.  Right side - sinus.  Low grade fever.  Thick mucus production.  Some minimal cough.  No chest congestion.  No sob. Eating.  No nausea or vomiting.  No increased acid reflux.  Discussed flonase.  Husband sick last 1-2 weeks. Discussed possible covid.  Discussed testing.  She declines.  Discussed quarantine.     ROS: See pertinent positives and negatives per HPI.  Past Medical History:  Diagnosis Date  . Anxiety   . Hypercholesterolemia   . Hypertension     Past Surgical History:  Procedure Laterality Date  . TONSILLECTOMY AND ADENOIDECTOMY Bilateral 1980    Family History  Problem Relation Age of Onset  . Stroke Maternal  Grandmother     SOCIAL HX: reviewed.    Current Outpatient Medications:  .  amLODipine (NORVASC) 5 MG tablet, Take 1 tablet (5 mg total) by mouth daily., Disp: 90 tablet, Rfl: 3 .  amoxicillin-clavulanate (AUGMENTIN) 875-125 MG tablet, Take 1 tablet by mouth 2 (two) times daily., Disp: 20 tablet, Rfl: 0 .  Cholecalciferol (VITAMIN D-3 PO), Take by mouth., Disp: , Rfl:  .  citalopram (CELEXA) 20 MG tablet, TAKE 1 TABLET BY MOUTH EVERY DAY, Disp: 90 tablet, Rfl: 1 .  fluticasone (FLONASE) 50 MCG/ACT nasal spray, Place 2 sprays into both nostrils daily., Disp: 16 g, Rfl: 6 .  hydrocortisone-pramoxine (ANALPRAM HC) 2.5-1 % rectal cream, Place 1 application rectally 2 (two) times daily as needed for hemorrhoids or anal itching., Disp: 30 g, Rfl: 0 .  losartan-hydrochlorothiazide (HYZAAR) 100-25 MG tablet, Take 1 tablet by mouth daily., Disp: 90 tablet, Rfl: 3  EXAM:  GENERAL: alert, oriented, appears well and in no acute distress  HEENT: atraumatic, conjunttiva clear, no obvious abnormalities on inspection of external nose and ears  NECK: normal movements of the head and neck  LUNGS: on inspection no signs of respiratory distress, breathing rate appears normal, no obvious gross SOB, gasping or wheezing  CV: no obvious cyanosis  PSYCH/NEURO: pleasant and cooperative, no obvious depression or anxiety, speech and thought processing grossly intact  ASSESSMENT AND PLAN:  Discussed the following assessment and plan:  Sinusitis She reports her symptoms are similar to her previous sinus infections.  Symptoms just started.  Discussed covid testing.  Discussed self quarantined.  She declines to be tested at this time.  Will quarantine.  Treat with flonase nasal spray and saline nasal spray as directed.  Mucinex/robitussin as directed.  augmentin rx sent in to pharmacy.  Will take if symptoms worsen.  Follow closely.  Call with update.     Meds ordered this encounter  Medications  .  amoxicillin-clavulanate (AUGMENTIN) 875-125 MG tablet    Sig: Take 1 tablet by mouth 2 (two) times daily.    Dispense:  20 tablet    Refill:  0     I discussed the assessment and treatment plan with the patient. The patient was provided an opportunity to ask questions and all were answered. The patient agreed with the plan and demonstrated an understanding of the instructions.   The patient was advised to call back or seek an in-person evaluation if the symptoms worsen or if the condition fails to improve as anticipated.   Dale [place], MD

## 2019-10-04 ENCOUNTER — Encounter: Payer: Self-pay | Admitting: Internal Medicine

## 2019-10-04 DIAGNOSIS — J329 Chronic sinusitis, unspecified: Secondary | ICD-10-CM | POA: Insufficient documentation

## 2019-10-04 NOTE — Assessment & Plan Note (Signed)
She reports her symptoms are similar to her previous sinus infections.  Symptoms just started.  Discussed covid testing.  Discussed self quarantined.  She declines to be tested at this time.  Will quarantine.  Treat with flonase nasal spray and saline nasal spray as directed.  Mucinex/robitussin as directed.  augmentin rx sent in to pharmacy.  Will take if symptoms worsen.  Follow closely.  Call with update.

## 2019-11-27 ENCOUNTER — Other Ambulatory Visit: Payer: Self-pay

## 2019-11-27 ENCOUNTER — Telehealth (INDEPENDENT_AMBULATORY_CARE_PROVIDER_SITE_OTHER): Payer: BC Managed Care – PPO | Admitting: Internal Medicine

## 2019-11-27 ENCOUNTER — Encounter: Payer: Self-pay | Admitting: Internal Medicine

## 2019-11-27 DIAGNOSIS — E7800 Pure hypercholesterolemia, unspecified: Secondary | ICD-10-CM | POA: Diagnosis not present

## 2019-11-27 DIAGNOSIS — R5383 Other fatigue: Secondary | ICD-10-CM | POA: Diagnosis not present

## 2019-11-27 DIAGNOSIS — E559 Vitamin D deficiency, unspecified: Secondary | ICD-10-CM

## 2019-11-27 DIAGNOSIS — F419 Anxiety disorder, unspecified: Secondary | ICD-10-CM

## 2019-11-27 DIAGNOSIS — I10 Essential (primary) hypertension: Secondary | ICD-10-CM

## 2019-11-27 NOTE — Progress Notes (Signed)
Patient ID: Kim Love, female   DOB: 05/26/1956, 64 y.o.   MRN: [id number]   Virtual Visit via video Note  This visit type was conducted due to national recommendations for restrictions regarding the COVID-19 pandemic (e.g. social distancing).  This format is felt to be most appropriate for this patient at this time.  All issues noted in this document were discussed and addressed.  No physical exam was performed (except for noted visual exam findings with Video Visits).   I connected with Ranae Pila by a video enabled telemedicine application and verified that I am speaking with the correct person using two identifiers. Location patient: home Location provider: work  Persons participating in the virtual visit: patient, provider  I discussed the limitations, risks, security and privacy concerns of performing an evaluation and management service by telephone and the availability of in person appointments. The patient expressed understanding and agreed to proceed.   Reason for visit: scheduled follow up.    HPI: She reports she is doing relatively well.  Feels stress is better.  She is still having stress with her mother-n-law.  Overall handling things well.  Does not feel needs any further intervention.  Has good support.  Stays active.  No chest pain or sob.  No acid reflux. No abdominal pain.  Bowels stable.  She does report that she gets sleepy in the am after taking her blood pressure medication.  Has not checked her blood pressure when this occurs, but was questioning if her blood pressure could be too low.  Did check her blood pressure today and was 126/76.  She also reports that she gets up early with her husband (around 4:30) and could just be tired.  Does report is restless at night sleeping - sometimes.  Does wake up tired and then will go back to sleep.  Does snore.  Discussed sleep apnea.  Wants to think about having further testing.     ROS: See pertinent positives and negatives per  HPI.  Past Medical History:  Diagnosis Date  . Anxiety   . Hypercholesterolemia   . Hypertension     Past Surgical History:  Procedure Laterality Date  . TONSILLECTOMY AND ADENOIDECTOMY Bilateral 1980    Family History  Problem Relation Age of Onset  . Stroke Maternal Grandmother     SOCIAL HX: reviewed.    Current Outpatient Medications:  .  amLODipine (NORVASC) 5 MG tablet, Take 1 tablet (5 mg total) by mouth daily., Disp: 90 tablet, Rfl: 3 .  amoxicillin-clavulanate (AUGMENTIN) 875-125 MG tablet, Take 1 tablet by mouth 2 (two) times daily., Disp: 20 tablet, Rfl: 0 .  Cholecalciferol (VITAMIN D-3 PO), Take by mouth., Disp: , Rfl:  .  citalopram (CELEXA) 20 MG tablet, TAKE 1 TABLET BY MOUTH EVERY DAY, Disp: 90 tablet, Rfl: 1 .  fluticasone (FLONASE) 50 MCG/ACT nasal spray, Place 2 sprays into both nostrils daily., Disp: 16 g, Rfl: 6 .  hydrocortisone-pramoxine (ANALPRAM HC) 2.5-1 % rectal cream, Place 1 application rectally 2 (two) times daily as needed for hemorrhoids or anal itching., Disp: 30 g, Rfl: 0 .  losartan-hydrochlorothiazide (HYZAAR) 100-25 MG tablet, Take 1 tablet by mouth daily., Disp: 90 tablet, Rfl: 3  EXAM:  VITALS per patient if applicable:  993/71  GENERAL: alert, oriented, appears well and in no acute distress  HEENT: atraumatic, conjunttiva clear, no obvious abnormalities on inspection of external nose and ears  NECK: normal movements of the head and neck  LUNGS: on inspection  no signs of respiratory distress, breathing rate appears normal, no obvious gross SOB, gasping or wheezing  CV: no obvious cyanosis  PSYCH/NEURO: pleasant and cooperative, no obvious depression or anxiety, speech and thought processing grossly intact  ASSESSMENT AND PLAN:  Discussed the following assessment and plan:  Anxiety Handling stress.  On citalopram.  Stable.  Follow.    Essential hypertension, benign Blood pressure as outlined.  On amlodipine, losartan/hctz.   Continue current medication regimen.  Follow pressures.  Follow metabolic panel.   Fatigue Fatigue as outlined.  Gets up early with her husband.  Discussed further w/up.  Wants to monitor.  Follow.    Hypercholesterolemia Low cholesterol diet and exercise.  Follow lipid panel.   Vitamin D deficiency Follow vitamin D level.     Orders Placed This Encounter  Procedures  . Hepatic function panel    Standing Status:   Future    Standing Expiration Date:   12/04/2020  . Lipid panel    Standing Status:   Future    Standing Expiration Date:   12/04/2020  . Basic metabolic panel    Standing Status:   Future    Standing Expiration Date:   12/04/2020     I discussed the assessment and treatment plan with the patient. The patient was provided an opportunity to ask questions and all were answered. The patient agreed with the plan and demonstrated an understanding of the instructions.   The patient was advised to call back or seek an in-person evaluation if the symptoms worsen or if the condition fails to improve as anticipated.    [place], MD

## 2019-12-05 ENCOUNTER — Encounter: Payer: Self-pay | Admitting: Internal Medicine

## 2019-12-05 NOTE — Assessment & Plan Note (Signed)
Blood pressure as outlined.  On amlodipine, losartan/hctz.  Continue current medication regimen.  Follow pressures.  Follow metabolic panel.

## 2019-12-05 NOTE — Assessment & Plan Note (Signed)
Fatigue as outlined.  Gets up early with her husband.  Discussed further w/up.  Wants to monitor.  Follow.

## 2019-12-05 NOTE — Assessment & Plan Note (Signed)
Low cholesterol diet and exercise.  Follow lipid panel.   

## 2019-12-05 NOTE — Assessment & Plan Note (Signed)
Handling stress.  On citalopram.  Stable.  Follow.

## 2019-12-05 NOTE — Assessment & Plan Note (Signed)
Follow vitamin D level.  

## 2020-01-14 LAB — HM MAMMOGRAPHY

## 2020-01-15 ENCOUNTER — Other Ambulatory Visit: Payer: Self-pay | Admitting: Obstetrics and Gynecology

## 2020-01-15 DIAGNOSIS — R928 Other abnormal and inconclusive findings on diagnostic imaging of breast: Secondary | ICD-10-CM

## 2020-01-25 ENCOUNTER — Other Ambulatory Visit: Payer: Self-pay

## 2020-01-25 ENCOUNTER — Ambulatory Visit
Admission: RE | Admit: 2020-01-25 | Discharge: 2020-01-25 | Disposition: A | Discharge status: Home / Self Care | Payer: BC Managed Care – PPO | Source: Ambulatory Visit | Attending: Obstetrics and Gynecology | Admitting: Obstetrics and Gynecology

## 2020-01-25 DIAGNOSIS — R928 Other abnormal and inconclusive findings on diagnostic imaging of breast: Secondary | ICD-10-CM

## 2020-01-29 ENCOUNTER — Telehealth: Payer: Self-pay | Admitting: Internal Medicine

## 2020-01-29 DIAGNOSIS — Z8639 Personal history of other endocrine, nutritional and metabolic disease: Secondary | ICD-10-CM

## 2020-01-29 DIAGNOSIS — E559 Vitamin D deficiency, unspecified: Secondary | ICD-10-CM

## 2020-01-29 NOTE — Telephone Encounter (Signed)
Pt has a lab appt 5/25 and would like you to add Vitamin D to that order

## 2020-02-01 NOTE — Telephone Encounter (Signed)
Okay to order?

## 2020-02-01 NOTE — Telephone Encounter (Signed)
Patient informed and verbalized understanding

## 2020-02-01 NOTE — Telephone Encounter (Signed)
Order placed for vitamin D

## 2020-02-02 ENCOUNTER — Other Ambulatory Visit: Payer: Self-pay

## 2020-02-02 ENCOUNTER — Other Ambulatory Visit (INDEPENDENT_AMBULATORY_CARE_PROVIDER_SITE_OTHER): Payer: BC Managed Care – PPO

## 2020-02-02 DIAGNOSIS — I10 Essential (primary) hypertension: Secondary | ICD-10-CM

## 2020-02-02 DIAGNOSIS — Z8639 Personal history of other endocrine, nutritional and metabolic disease: Secondary | ICD-10-CM | POA: Diagnosis not present

## 2020-02-02 DIAGNOSIS — E7800 Pure hypercholesterolemia, unspecified: Secondary | ICD-10-CM | POA: Diagnosis not present

## 2020-02-02 LAB — HEPATIC FUNCTION PANEL
ALT: 9 U/L (ref 0–35)
AST: 12 U/L (ref 0–37)
Albumin: 4.1 g/dL (ref 3.5–5.2)
Alkaline Phosphatase: 94 U/L (ref 39–117)
Bilirubin, Direct: 0.1 mg/dL (ref 0.0–0.3)
Total Bilirubin: 0.6 mg/dL (ref 0.2–1.2)
Total Protein: 6.7 g/dL (ref 6.0–8.3)

## 2020-02-02 LAB — LIPID PANEL
Cholesterol: 216 mg/dL — ABNORMAL HIGH (ref 0–200)
HDL: 38.1 mg/dL — ABNORMAL LOW (ref >39.00)
LDL Cholesterol: 151 mg/dL — ABNORMAL HIGH (ref 0–99)
NonHDL: 178.23
Total CHOL/HDL Ratio: 6
Triglycerides: 136 mg/dL (ref 0.0–149.0)
VLDL: 27.2 mg/dL (ref 0.0–40.0)

## 2020-02-02 LAB — BASIC METABOLIC PANEL
BUN: 9 mg/dL (ref 6–23)
CO2: 30 mEq/L (ref 19–32)
Calcium: 8.9 mg/dL (ref 8.4–10.5)
Chloride: 102 mEq/L (ref 96–112)
Creatinine, Ser: 0.77 mg/dL (ref 0.40–1.20)
GFR: 75.79 mL/min (ref >60.00)
Glucose, Bld: 96 mg/dL (ref 70–99)
Potassium: 3.4 mEq/L — ABNORMAL LOW (ref 3.5–5.1)
Sodium: 139 mEq/L (ref 135–145)

## 2020-02-02 LAB — VITAMIN D 25 HYDROXY (VIT D DEFICIENCY, FRACTURES): VITD: 46.84 ng/mL (ref 30.00–100.00)

## 2020-02-05 ENCOUNTER — Encounter: Payer: Self-pay | Admitting: Internal Medicine

## 2020-02-05 ENCOUNTER — Ambulatory Visit: Payer: BC Managed Care – PPO | Admitting: Internal Medicine

## 2020-02-05 ENCOUNTER — Other Ambulatory Visit: Payer: Self-pay

## 2020-02-05 DIAGNOSIS — E7800 Pure hypercholesterolemia, unspecified: Secondary | ICD-10-CM | POA: Diagnosis not present

## 2020-02-05 DIAGNOSIS — S30861A Insect bite (nonvenomous) of abdominal wall, initial encounter: Secondary | ICD-10-CM

## 2020-02-05 DIAGNOSIS — M25511 Pain in right shoulder: Secondary | ICD-10-CM | POA: Diagnosis not present

## 2020-02-05 DIAGNOSIS — I10 Essential (primary) hypertension: Secondary | ICD-10-CM | POA: Diagnosis not present

## 2020-02-05 DIAGNOSIS — F419 Anxiety disorder, unspecified: Secondary | ICD-10-CM

## 2020-02-05 DIAGNOSIS — W57XXXA Bitten or stung by nonvenomous insect and other nonvenomous arthropods, initial encounter: Secondary | ICD-10-CM

## 2020-02-05 NOTE — Assessment & Plan Note (Signed)
The 10-year ASCVD risk score Denman George DC Montez Hageman., et al., 2013) is: 6.6%   Values used to calculate the score:     Age: 64 years     Sex: Female     Is Non-Hispanic African American: No     Diabetic: No     Tobacco smoker: No     Systolic Blood Pressure: 124 mmHg     Is BP treated: Yes     HDL Cholesterol: 38.1 mg/dL     Total Cholesterol: 216 mg/dL   Low cholesterol diet and exercise.  Follow lipid panel.

## 2020-02-05 NOTE — Progress Notes (Signed)
Patient ID: Kim Love, female   DOB: 16-Jul-1956, 64 y.o.   MRN: [id number]   Subjective:    Patient ID: Kim Love, female    DOB: May 24, 1956, 64 y.o.   MRN: [id number]  HPI This visit occurred during the SARS-CoV-2 public health emergency.  Safety protocols were in place, including screening questions prior to the visit, additional usage of staff PPE, and extensive cleaning of exam room while observing appropriate contact time as indicated for disinfecting solutions.  Patient here for a scheduled follow up.  She reports she is doing relatively well.  Handling stress.  On citalopram.  Feels this dose is working well for her.  Trying to stay active.  No chest pain.  No sob reported.  No acid reflux.  No abdominal pain.  Bowels moving.  Right shoulder discomfort - worse with lying on her shoulder.  Takes tylenol prn.  Will notify me if desires any further w/up or intervention.  Found a tick - right groin - 02/01/20.  No fever.  No rash.  No headache or joint aches.    Past Medical History:  Diagnosis Date  . Anxiety   . Hypercholesterolemia   . Hypertension    Past Surgical History:  Procedure Laterality Date  . TONSILLECTOMY AND ADENOIDECTOMY Bilateral 1980   Family History  Problem Relation Age of Onset  . Stroke Maternal Grandmother    Social History   Socioeconomic History  . Marital status: Married    Spouse name: Not on file  . Number of children: 1  . Years of education: Not on file  . Highest education level: Not on file  Occupational History    Employer: [employer]  Tobacco Use  . Smoking status: Former Smoker    Types: Cigarettes    Quit date: 09/10/2012    Years since quitting: 7.4  . Smokeless tobacco: Current User  Substance and Sexual Activity  . Alcohol use: No    Alcohol/week: 0.0 standard drinks  . Drug use: No  . Sexual activity: Not on file  Other Topics Concern  . Not on file  Social History Narrative  . Not on file   Social Determinants of  Health   Financial Resource Strain:   . Difficulty of Paying Living Expenses:   Food Insecurity:   . Worried About Programme researcher, broadcasting/film/video in the Last Year:   . Barista in the Last Year:   Transportation Needs:   . Freight forwarder (Medical):   Marland Kitchen Lack of Transportation (Non-Medical):   Physical Activity:   . Days of Exercise per Week:   . Minutes of Exercise per Session:   Stress:   . Feeling of Stress :   Social Connections:   . Frequency of Communication with Friends and Family:   . Frequency of Social Gatherings with Friends and Family:   . Attends Religious Services:   . Active Member of Clubs or Organizations:   . Attends Banker Meetings:   Marland Kitchen Marital Status:     Outpatient Encounter Medications as of 02/05/2020  Medication Sig  . amLODipine (NORVASC) 5 MG tablet Take 1 tablet (5 mg total) by mouth daily.  . Cholecalciferol (VITAMIN D-3 PO) Take by mouth.  . citalopram (CELEXA) 20 MG tablet TAKE 1 TABLET BY MOUTH EVERY DAY  . fluticasone (FLONASE) 50 MCG/ACT nasal spray Place 2 sprays into both nostrils daily.  . hydrocortisone-pramoxine (ANALPRAM HC) 2.5-1 % rectal cream Place 1 application  rectally 2 (two) times daily as needed for hemorrhoids or anal itching.  . losartan-hydrochlorothiazide (HYZAAR) 100-25 MG tablet Take 1 tablet by mouth daily.  . mupirocin ointment (BACTROBAN) 2 % SMARTSIG:1 Application Topical 2-3 Times Daily  . [DISCONTINUED] amoxicillin-clavulanate (AUGMENTIN) 875-125 MG tablet Take 1 tablet by mouth 2 (two) times daily.   No facility-administered encounter medications on file as of 02/05/2020.    Review of Systems  Constitutional: Negative for appetite change and unexpected weight change.  HENT: Negative for congestion and sinus pressure.   Respiratory: Negative for cough, chest tightness and shortness of breath.   Cardiovascular: Negative for chest pain, palpitations and leg swelling.  Gastrointestinal: Negative for  abdominal pain, diarrhea, nausea and vomiting.  Genitourinary: Negative for difficulty urinating and dysuria.  Musculoskeletal: Negative for joint swelling and myalgias.       Right shoulder pain as outlined.    Skin: Negative for color change and rash.  Neurological: Negative for dizziness, light-headedness and headaches.  Psychiatric/Behavioral: Negative for agitation and dysphoric mood.       Objective:    Physical Exam Vitals reviewed.  Constitutional:      General: She is not in acute distress.    Appearance: Normal appearance.  HENT:     Head: Normocephalic and atraumatic.     Right Ear: External ear normal.     Left Ear: External ear normal.  Eyes:     General: No scleral icterus.       Right eye: No discharge.        Left eye: No discharge.     Conjunctiva/sclera: Conjunctivae normal.  Neck:     Thyroid: No thyromegaly.  Cardiovascular:     Rate and Rhythm: Normal rate and regular rhythm.  Pulmonary:     Effort: No respiratory distress.     Breath sounds: Normal breath sounds. No wheezing.  Abdominal:     General: Bowel sounds are normal.     Palpations: Abdomen is soft.     Tenderness: There is no abdominal tenderness.  Musculoskeletal:        General: No swelling or tenderness.     Cervical back: Neck supple. No tenderness.  Lymphadenopathy:     Cervical: No cervical adenopathy.  Skin:    Findings: No erythema or rash.  Neurological:     Mental Status: She is alert.  Psychiatric:        Mood and Affect: Mood normal.        Behavior: Behavior normal.     BP 124/78   Pulse 78   Temp (!) 97.4 F (36.3 C)   Resp 16   Ht 5\' 2"  (1.575 m)   Wt 142 lb 3.2 oz (64.5 kg)   SpO2 97%   BMI 26.01 kg/m  Wt Readings from Last 3 Encounters:  02/05/20 142 lb 3.2 oz (64.5 kg)  11/27/19 143 lb (64.9 kg)  09/30/19 143 lb (64.9 kg)     Lab Results  Component Value Date   WBC 6.6 09/01/2019   HGB 14.4 09/01/2019   HCT 42.7 09/01/2019   PLT 285.0 09/01/2019    GLUCOSE 96 02/02/2020   CHOL 216 (H) 02/02/2020   TRIG 136.0 02/02/2020   HDL 38.10 (L) 02/02/2020   LDLDIRECT 129.0 03/26/2018   LDLCALC 151 (H) 02/02/2020   ALT 9 02/02/2020   AST 12 02/02/2020   NA 139 02/02/2020   K 3.4 (L) 02/02/2020   CL 102 02/02/2020   CREATININE 0.77 02/02/2020  BUN 9 02/02/2020   CO2 30 02/02/2020   TSH 1.44 09/01/2019    US BREAST LTD UNI LEFT INC AXILLA  Result Date: 01/27/2020 CLINICAL DATA:  Patient returns after screening study for evaluation of possible LEFT breast mass. EXAM: DIGITAL DIAGNOSTIC LEFT MAMMOGRAM WITH CAD AND TOMO ULTRASOUND LEFT BREAST COMPARISON:  01/14/2020 and earlier ACR Breast Density Category b: There are scattered areas of fibroglandular density. FINDINGS: Additional 2-D and 3-D images are performed. These views confirm presence of a circumscribed oval mass in the LATERAL aspect of the LEFT breast, posterior depth. Mammographic images were processed with CAD. Targeted ultrasound is performed, showing a cyst with single internal septation in the 3 o'clock location of the LEFT breast 3 centimeters from the nipple measuring 0.8 x 0.3 x 0.7 centimeters. No solid component or internal blood flow. IMPRESSION: Benign cyst in the LEFT breast. No mammographic or ultrasound evidence for malignancy. RECOMMENDATION: Screening mammogram in one year.(Code:SM-B-01Y) I have discussed the findings and recommendations with the patient. If applicable, a reminder letter will be sent to the patient regarding the next appointment. BI-RADS CATEGORY  2: Benign. Electronically Signed   By: Norva Pavlov M.D.   On: 01/25/2020 14:31   MM DIAG BREAST TOMO UNI LEFT  Result Date: 01/25/2020 CLINICAL DATA:  Patient returns after screening study for evaluation of possible LEFT breast mass. EXAM: DIGITAL DIAGNOSTIC LEFT MAMMOGRAM WITH CAD AND TOMO ULTRASOUND LEFT BREAST COMPARISON:  01/14/2020 and earlier ACR Breast Density Category b: There are scattered areas of  fibroglandular density. FINDINGS: Additional 2-D and 3-D images are performed. These views confirm presence of a circumscribed oval mass in the LATERAL aspect of the LEFT breast, posterior depth. Mammographic images were processed with CAD. Targeted ultrasound is performed, showing a cyst with single internal septation in the 3 o'clock location of the LEFT breast 3 centimeters from the nipple measuring 0.8 x 0.3 x 0.7 centimeters. No solid component or internal blood flow. IMPRESSION: Benign cyst in the LEFT breast. No mammographic or ultrasound evidence for malignancy. RECOMMENDATION: Screening mammogram in one year.(Code:SM-B-01Y) I have discussed the findings and recommendations with the patient. If applicable, a reminder letter will be sent to the patient regarding the next appointment. BI-RADS CATEGORY  2: Benign. Electronically Signed   By: Norva Pavlov M.D.   On: 01/25/2020 14:31       Assessment & Plan:   Problem List Items Addressed This Visit    Anxiety    Appears to be handling stress.  Continue citalopram.  Follow.        Essential hypertension, benign    Blood pressure doing well.  Continue losartan/hctz and amlodipine.  Follow pressures.  Follow metabolic panel.        Relevant Orders   Comprehensive metabolic panel   Hypercholesterolemia    The 10-year ASCVD risk score Denman George DC Jr., et al., 2013) is: 6.6%   Values used to calculate the score:     Age: 33 years     Sex: Female     Is Non-Hispanic African American: No     Diabetic: No     Tobacco smoker: No     Systolic Blood Pressure: 124 mmHg     Is BP treated: Yes     HDL Cholesterol: 38.1 mg/dL     Total Cholesterol: 216 mg/dL   Low cholesterol diet and exercise.  Follow lipid panel.       Relevant Orders   Lipid panel   Right shoulder pain  Tylenol prn.  Will notify me if desires any further w/up or intervention.        Tick bite of groin    Removed intact 02/01/20.  No fever. No headache. No rash.  No  joint aches.  Follow.            Dale Castlewood, MD

## 2020-02-14 ENCOUNTER — Encounter: Payer: Self-pay | Admitting: Internal Medicine

## 2020-02-14 DIAGNOSIS — M25511 Pain in right shoulder: Secondary | ICD-10-CM | POA: Insufficient documentation

## 2020-02-14 DIAGNOSIS — W57XXXA Bitten or stung by nonvenomous insect and other nonvenomous arthropods, initial encounter: Secondary | ICD-10-CM | POA: Insufficient documentation

## 2020-02-14 DIAGNOSIS — S30861A Insect bite (nonvenomous) of abdominal wall, initial encounter: Secondary | ICD-10-CM | POA: Insufficient documentation

## 2020-02-14 NOTE — Assessment & Plan Note (Signed)
Removed intact 02/01/20.  No fever. No headache. No rash.  No joint aches.  Follow.

## 2020-02-14 NOTE — Assessment & Plan Note (Signed)
Blood pressure doing well.  Continue losartan/hctz and amlodipine.  Follow pressures.  Follow metabolic panel.  

## 2020-02-14 NOTE — Assessment & Plan Note (Signed)
Tylenol prn.  Will notify me if desires any further w/up or intervention.

## 2020-02-14 NOTE — Assessment & Plan Note (Signed)
Appears to be handling stress.  Continue citalopram.  Follow.

## 2020-03-18 ENCOUNTER — Other Ambulatory Visit: Payer: Self-pay | Admitting: Internal Medicine

## 2020-04-22 ENCOUNTER — Other Ambulatory Visit: Payer: Self-pay | Admitting: Internal Medicine

## 2020-06-08 ENCOUNTER — Other Ambulatory Visit: Payer: Self-pay

## 2020-06-08 ENCOUNTER — Other Ambulatory Visit (INDEPENDENT_AMBULATORY_CARE_PROVIDER_SITE_OTHER): Payer: BC Managed Care – PPO

## 2020-06-08 DIAGNOSIS — I10 Essential (primary) hypertension: Secondary | ICD-10-CM

## 2020-06-08 DIAGNOSIS — E7800 Pure hypercholesterolemia, unspecified: Secondary | ICD-10-CM | POA: Diagnosis not present

## 2020-06-08 LAB — COMPREHENSIVE METABOLIC PANEL
ALT: 11 U/L (ref 0–35)
AST: 13 U/L (ref 0–37)
Albumin: 4.1 g/dL (ref 3.5–5.2)
Alkaline Phosphatase: 84 U/L (ref 39–117)
BUN: 13 mg/dL (ref 6–23)
CO2: 32 mEq/L (ref 19–32)
Calcium: 9.1 mg/dL (ref 8.4–10.5)
Chloride: 102 mEq/L (ref 96–112)
Creatinine, Ser: 0.9 mg/dL (ref 0.40–1.20)
GFR: 63.23 mL/min (ref >60.00)
Glucose, Bld: 85 mg/dL (ref 70–99)
Potassium: 4.1 mEq/L (ref 3.5–5.1)
Sodium: 141 mEq/L (ref 135–145)
Total Bilirubin: 0.6 mg/dL (ref 0.2–1.2)
Total Protein: 6.8 g/dL (ref 6.0–8.3)

## 2020-06-08 LAB — LIPID PANEL
Cholesterol: 214 mg/dL — ABNORMAL HIGH (ref 0–200)
HDL: 42.3 mg/dL (ref >39.00)
LDL Cholesterol: 154 mg/dL — ABNORMAL HIGH (ref 0–99)
NonHDL: 171.47
Total CHOL/HDL Ratio: 5
Triglycerides: 89 mg/dL (ref 0.0–149.0)
VLDL: 17.8 mg/dL (ref 0.0–40.0)

## 2020-06-10 ENCOUNTER — Encounter: Payer: Self-pay | Admitting: Internal Medicine

## 2020-06-10 ENCOUNTER — Ambulatory Visit (INDEPENDENT_AMBULATORY_CARE_PROVIDER_SITE_OTHER): Payer: BC Managed Care – PPO | Admitting: Internal Medicine

## 2020-06-10 ENCOUNTER — Other Ambulatory Visit: Payer: Self-pay

## 2020-06-10 VITALS — BP 138/80 | HR 82 | Temp 98.2°F | Resp 16 | Ht 62.0 in | Wt 141.8 lb

## 2020-06-10 DIAGNOSIS — E7800 Pure hypercholesterolemia, unspecified: Secondary | ICD-10-CM

## 2020-06-10 DIAGNOSIS — I10 Essential (primary) hypertension: Secondary | ICD-10-CM

## 2020-06-10 DIAGNOSIS — F419 Anxiety disorder, unspecified: Secondary | ICD-10-CM

## 2020-06-10 DIAGNOSIS — E559 Vitamin D deficiency, unspecified: Secondary | ICD-10-CM | POA: Diagnosis not present

## 2020-06-10 DIAGNOSIS — Z0000 Encounter for general adult medical examination without abnormal findings: Secondary | ICD-10-CM

## 2020-06-10 NOTE — Assessment & Plan Note (Addendum)
.  The 10-year ASCVD risk score Denman George DC Montez Hageman., et al., 2013) is: 8.3%   Values used to calculate the score:     Age: 64 years     Sex: Female     Is Non-Hispanic African American: No     Diabetic: No     Tobacco smoker: No     Systolic Blood Pressure: 138 mmHg     Is BP treated: Yes     HDL Cholesterol: 42.3 mg/dL     Total Cholesterol: 214 mg/dL  Discussed calculated cholesterol risk.  Discussed recommendation to start cholesterol medication.  She declines.  Wants to continue to work on diet and exercise.  Follow lipid panel.

## 2020-06-10 NOTE — Progress Notes (Signed)
Patient ID: Kim Love, female   DOB: 04/20/1956, 64 y.o.   MRN: [id number]   Subjective:    Patient ID: Kim Love, female    DOB: 09-26-1955, 64 y.o.   MRN: [id number]  HPI This visit occurred during the SARS-CoV-2 public health emergency.  Safety protocols were in place, including screening questions prior to the visit, additional usage of staff PPE, and extensive cleaning of exam room while observing appropriate contact time as indicated for disinfecting solutions.  Patient here for her physical exam.  She reports she is doing relatively well. Handling stress.  Stays physically active.  No chest pain or sob reported.  No abdominal pain or bowel change reported.  Discussed flu vaccine and covid vaccine.  She declines. Last colonoscopy - Dr Ewing Schlein - 02/2015.  Due.  She will call to schedule.  Discussed labs and calculated cholesterol risk.    Past Medical History:  Diagnosis Date  . Anxiety   . Hypercholesterolemia   . Hypertension    Past Surgical History:  Procedure Laterality Date  . TONSILLECTOMY AND ADENOIDECTOMY Bilateral 1980   Family History  Problem Relation Age of Onset  . Stroke Maternal Grandmother    Social History   Socioeconomic History  . Marital status: Married    Spouse name: Not on file  . Number of children: 1  . Years of education: Not on file  . Highest education level: Not on file  Occupational History    Employer: [employer]  Tobacco Use  . Smoking status: Former Smoker    Types: Cigarettes    Quit date: 09/10/2012    Years since quitting: 7.7  . Smokeless tobacco: Current User  Substance and Sexual Activity  . Alcohol use: No    Alcohol/week: 0.0 standard drinks  . Drug use: No  . Sexual activity: Not on file  Other Topics Concern  . Not on file  Social History Narrative  . Not on file   Social Determinants of Health   Financial Resource Strain:   . Difficulty of Paying Living Expenses: Not on file  Food Insecurity:   . Worried  About Programme researcher, broadcasting/film/video in the Last Year: Not on file  . Ran Out of Food in the Last Year: Not on file  Transportation Needs:   . Lack of Transportation (Medical): Not on file  . Lack of Transportation (Non-Medical): Not on file  Physical Activity:   . Days of Exercise per Week: Not on file  . Minutes of Exercise per Session: Not on file  Stress:   . Feeling of Stress : Not on file  Social Connections:   . Frequency of Communication with Friends and Family: Not on file  . Frequency of Social Gatherings with Friends and Family: Not on file  . Attends Religious Services: Not on file  . Active Member of Clubs or Organizations: Not on file  . Attends Banker Meetings: Not on file  . Marital Status: Not on file    Outpatient Encounter Medications as of 06/10/2020  Medication Sig  . amLODipine (NORVASC) 5 MG tablet TAKE 1 TABLET BY MOUTH EVERY DAY  . Cholecalciferol (VITAMIN D-3 PO) Take by mouth.  . citalopram (CELEXA) 20 MG tablet TAKE 1 TABLET BY MOUTH EVERY DAY  . fluticasone (FLONASE) 50 MCG/ACT nasal spray Place 2 sprays into both nostrils daily.  . hydrocortisone-pramoxine (ANALPRAM HC) 2.5-1 % rectal cream Place 1 application rectally 2 (two) times daily as needed for  hemorrhoids or anal itching.  . losartan-hydrochlorothiazide (HYZAAR) 100-25 MG tablet TAKE 1 TABLET BY MOUTH EVERY DAY  . mupirocin ointment (BACTROBAN) 2 % SMARTSIG:1 Application Topical 2-3 Times Daily   No facility-administered encounter medications on file as of 06/10/2020.    Review of Systems  Constitutional: Negative for appetite change and unexpected weight change.  HENT: Negative for congestion, sinus pressure and sore throat.   Eyes: Negative for pain and visual disturbance.  Respiratory: Negative for cough, chest tightness and shortness of breath.   Cardiovascular: Negative for chest pain, palpitations and leg swelling.  Gastrointestinal: Negative for abdominal pain, diarrhea and  vomiting.  Genitourinary: Negative for difficulty urinating and dysuria.  Musculoskeletal: Negative for joint swelling and myalgias.  Skin: Negative for color change and rash.  Neurological: Negative for dizziness, light-headedness and headaches.  Hematological: Negative for adenopathy. Does not bruise/bleed easily.  Psychiatric/Behavioral: Negative for agitation and dysphoric mood.       Objective:    Physical Exam Vitals reviewed.  Constitutional:      General: She is not in acute distress.    Appearance: Normal appearance. She is well-developed.  HENT:     Head: Normocephalic and atraumatic.     Right Ear: External ear normal.     Left Ear: External ear normal.  Eyes:     General: No scleral icterus.       Right eye: No discharge.        Left eye: No discharge.     Conjunctiva/sclera: Conjunctivae normal.  Neck:     Thyroid: No thyromegaly.  Cardiovascular:     Rate and Rhythm: Normal rate and regular rhythm.  Pulmonary:     Effort: No tachypnea, accessory muscle usage or respiratory distress.     Breath sounds: Normal breath sounds. No decreased breath sounds or wheezing.  Chest:     Breasts:        Right: No inverted nipple, mass, nipple discharge or tenderness (no axillary adenopathy).        Left: No inverted nipple, mass, nipple discharge or tenderness (no axilarry adenopathy).  Abdominal:     General: Bowel sounds are normal.     Palpations: Abdomen is soft.     Tenderness: There is no abdominal tenderness.  Musculoskeletal:        General: No swelling or tenderness.     Cervical back: Neck supple. No tenderness.  Lymphadenopathy:     Cervical: No cervical adenopathy.  Skin:    Findings: No erythema or rash.  Neurological:     Mental Status: She is alert and oriented to person, place, and time.  Psychiatric:        Mood and Affect: Mood normal.        Behavior: Behavior normal.     BP 138/80   Pulse 82   Temp 98.2 F (36.8 C) (Oral)   Resp 16   Ht  5\' 2"  (1.575 m)   Wt 141 lb 12.8 oz (64.3 kg)   SpO2 98%   BMI 25.94 kg/m  Wt Readings from Last 3 Encounters:  06/10/20 141 lb 12.8 oz (64.3 kg)  02/05/20 142 lb 3.2 oz (64.5 kg)  11/27/19 143 lb (64.9 kg)     Lab Results  Component Value Date   WBC 6.6 09/01/2019   HGB 14.4 09/01/2019   HCT 42.7 09/01/2019   PLT 285.0 09/01/2019   GLUCOSE 85 06/08/2020   CHOL 214 (H) 06/08/2020   TRIG 89.0 06/08/2020   HDL  42.30 06/08/2020   LDLDIRECT 129.0 03/26/2018   LDLCALC 154 (H) 06/08/2020   ALT 11 06/08/2020   AST 13 06/08/2020   NA 141 06/08/2020   K 4.1 06/08/2020   CL 102 06/08/2020   CREATININE 0.90 06/08/2020   BUN 13 06/08/2020   CO2 32 06/08/2020   TSH 1.44 09/01/2019    US BREAST LTD UNI LEFT INC AXILLA  Result Date: 01/27/2020 CLINICAL DATA:  Patient returns after screening study for evaluation of possible LEFT breast mass. EXAM: DIGITAL DIAGNOSTIC LEFT MAMMOGRAM WITH CAD AND TOMO ULTRASOUND LEFT BREAST COMPARISON:  01/14/2020 and earlier ACR Breast Density Category b: There are scattered areas of fibroglandular density. FINDINGS: Additional 2-D and 3-D images are performed. These views confirm presence of a circumscribed oval mass in the LATERAL aspect of the LEFT breast, posterior depth. Mammographic images were processed with CAD. Targeted ultrasound is performed, showing a cyst with single internal septation in the 3 o'clock location of the LEFT breast 3 centimeters from the nipple measuring 0.8 x 0.3 x 0.7 centimeters. No solid component or internal blood flow. IMPRESSION: Benign cyst in the LEFT breast. No mammographic or ultrasound evidence for malignancy. RECOMMENDATION: Screening mammogram in one year.(Code:SM-B-01Y) I have discussed the findings and recommendations with the patient. If applicable, a reminder letter will be sent to the patient regarding the next appointment. BI-RADS CATEGORY  2: Benign. Electronically Signed   By: Norva PavlovElizabeth  Brown M.D.   On: 01/25/2020  14:31   MM DIAG BREAST TOMO UNI LEFT  Result Date: 01/25/2020 CLINICAL DATA:  Patient returns after screening study for evaluation of possible LEFT breast mass. EXAM: DIGITAL DIAGNOSTIC LEFT MAMMOGRAM WITH CAD AND TOMO ULTRASOUND LEFT BREAST COMPARISON:  01/14/2020 and earlier ACR Breast Density Category b: There are scattered areas of fibroglandular density. FINDINGS: Additional 2-D and 3-D images are performed. These views confirm presence of a circumscribed oval mass in the LATERAL aspect of the LEFT breast, posterior depth. Mammographic images were processed with CAD. Targeted ultrasound is performed, showing a cyst with single internal septation in the 3 o'clock location of the LEFT breast 3 centimeters from the nipple measuring 0.8 x 0.3 x 0.7 centimeters. No solid component or internal blood flow. IMPRESSION: Benign cyst in the LEFT breast. No mammographic or ultrasound evidence for malignancy. RECOMMENDATION: Screening mammogram in one year.(Code:SM-B-01Y) I have discussed the findings and recommendations with the patient. If applicable, a reminder letter will be sent to the patient regarding the next appointment. BI-RADS CATEGORY  2: Benign. Electronically Signed   By: Norva PavlovElizabeth  Brown M.D.   On: 01/25/2020 14:31       Assessment & Plan:   Problem List Items Addressed This Visit    Vitamin D deficiency    Follow vitamin D level.       Hypercholesterolemia    .The 10-year ASCVD risk score Denman George(Goff DC Montez HagemanJr., et al., 2013) is: 8.3%   Values used to calculate the score:     Age: [age] years     Sex: Female     Is Non-Hispanic African American: No     Diabetic: No     Tobacco smoker: No     Systolic Blood Pressure: 138 mmHg     Is BP treated: Yes     HDL Cholesterol: 42.3 mg/dL     Total Cholesterol: 214 mg/dL  Discussed calculated cholesterol risk.  Discussed recommendation to start cholesterol medication.  She declines.  Wants to continue to work on diet and exercise.  Follow lipid panel.        Relevant Orders   Lipid panel   Health care maintenance    Physical today 06/10/20.  Sees Dr Malva Limes.  Mammogram (f/u left mammo - birads II.  Recommended f/u screening mammogram in one year.  Had bilateral mammo prior to f/u left.  Need report.  Last colonoscopyy 02/2015.  Sees Dr Ewing Schlein.  She will call to schedule.  Is due.       Essential hypertension, benign    Blood pressure doing well.  Continue losartan/hctz and amlodipine.  Follow pressures.  Follow metabolic panel.       Relevant Orders   CBC with Differential/Platelet   Comprehensive metabolic panel   TSH   Anxiety    Doing well on citalopram.  Follow.         Other Visit Diagnoses    Routine general medical examination at a health care facility    -  Primary       Dale Chadron, MD

## 2020-06-10 NOTE — Assessment & Plan Note (Addendum)
Physical today 06/10/20.  Sees Dr Malva Limes.  Mammogram (f/u left mammo - birads II.  Recommended f/u screening mammogram in one year.  Had bilateral mammo prior to f/u left.  Need report.  Last colonoscopyy 02/2015.  Sees Dr Ewing Schlein.  She will call to schedule.  Is due.

## 2020-06-18 ENCOUNTER — Telehealth: Payer: Self-pay | Admitting: Internal Medicine

## 2020-06-18 ENCOUNTER — Encounter: Payer: Self-pay | Admitting: Internal Medicine

## 2020-06-18 NOTE — Assessment & Plan Note (Signed)
Blood pressure doing well.  Continue losartan/hctz and amlodipine.  Follow pressures.  Follow metabolic panel.

## 2020-06-18 NOTE — Assessment & Plan Note (Signed)
Follow vitamin D level.  

## 2020-06-18 NOTE — Assessment & Plan Note (Signed)
Doing well on citalopram.  Follow.   

## 2020-06-18 NOTE — Telephone Encounter (Signed)
Sees Dr Malva Limes [facility])  Was here recently for visit.  We have copy of her f/u left breast mammogram (01/2020).  Need copy of screening mammogram that was done prior to the f/u.  Also need copy of last pap smear and office note.

## 2020-06-22 NOTE — Telephone Encounter (Signed)
Records requested

## 2020-09-10 DIAGNOSIS — I639 Cerebral infarction, unspecified: Secondary | ICD-10-CM

## 2020-09-10 HISTORY — DX: Cerebral infarction, unspecified: I63.9

## 2020-09-16 ENCOUNTER — Other Ambulatory Visit: Payer: Self-pay | Admitting: Internal Medicine

## 2020-10-07 ENCOUNTER — Other Ambulatory Visit: Payer: BC Managed Care – PPO

## 2020-10-11 ENCOUNTER — Encounter: Payer: Self-pay | Admitting: Internal Medicine

## 2020-10-11 ENCOUNTER — Telehealth (INDEPENDENT_AMBULATORY_CARE_PROVIDER_SITE_OTHER): Payer: BC Managed Care – PPO | Admitting: Internal Medicine

## 2020-10-11 DIAGNOSIS — E7800 Pure hypercholesterolemia, unspecified: Secondary | ICD-10-CM

## 2020-10-11 DIAGNOSIS — F419 Anxiety disorder, unspecified: Secondary | ICD-10-CM

## 2020-10-11 DIAGNOSIS — Z0000 Encounter for general adult medical examination without abnormal findings: Secondary | ICD-10-CM

## 2020-10-11 DIAGNOSIS — J329 Chronic sinusitis, unspecified: Secondary | ICD-10-CM | POA: Diagnosis not present

## 2020-10-11 DIAGNOSIS — I10 Essential (primary) hypertension: Secondary | ICD-10-CM

## 2020-10-11 NOTE — Progress Notes (Signed)
Patient ID: Kim Love, female   DOB: [birth year], 65 y.o.   MRN: [id number]   Virtual Visit via video Note  This visit type was conducted due to national recommendations for restrictions regarding the COVID-19 pandemic (e.g. social distancing).  This format is felt to be most appropriate for this patient at this time.  All issues noted in this document were discussed and addressed.  No physical exam was performed (except for noted visual exam findings with Video Visits).   I connected with Kim Love by a video enabled telemedicine application and verified that I am speaking with the correct person using two identifiers. Location patient: home Location provider: work Persons participating in the virtual visit: patient, provider  The limitations, risks, security and privacy concerns of performing an evaluation and management service by video and the availability of in person appointments have been discussed.  It has also been discussed with the patient that there may be a patient responsible charge related to this service. The patient expressed understanding and agreed to proceed.   Reason for visit: follow up appt  HPI: Follow up appt - f/u regarding blood pressure and cholesterol.  Reports symptoms started 5 days ago.  Cold - fever.  Left side tonsils - streaks.  Jaw/mouth - swollen.  Increased fatigue.  Headache.  Took tylenol.  Head felt full.  Glands full.  Feels better now.  Some fatigue and minimal cough, but feels better.  No chest pain or tightness.  No acid reflux reported.  No nausea or vomiting.  Previous diarrhea.      ROS: See pertinent positives and negatives per HPI.  Past Medical History:  Diagnosis Date  . Anxiety   . Hypercholesterolemia   . Hypertension     Past Surgical History:  Procedure Laterality Date  . TONSILLECTOMY AND ADENOIDECTOMY Bilateral 1980    Family History  Problem Relation Age of Onset  . Stroke Maternal Grandmother     SOCIAL HX: reviewed.     Current Outpatient Medications:  .  amLODipine (NORVASC) 5 MG tablet, TAKE 1 TABLET BY MOUTH EVERY DAY, Disp: 90 tablet, Rfl: 3 .  Cholecalciferol (VITAMIN D-3 PO), Take by mouth., Disp: , Rfl:  .  citalopram (CELEXA) 20 MG tablet, TAKE 1 TABLET BY MOUTH EVERY DAY, Disp: 90 tablet, Rfl: 1 .  fluticasone (FLONASE) 50 MCG/ACT nasal spray, Place 2 sprays into both nostrils daily., Disp: 16 g, Rfl: 6 .  hydrocortisone-pramoxine (ANALPRAM HC) 2.5-1 % rectal cream, Place 1 application rectally 2 (two) times daily as needed for hemorrhoids or anal itching., Disp: 30 g, Rfl: 0 .  losartan-hydrochlorothiazide (HYZAAR) 100-25 MG tablet, TAKE 1 TABLET BY MOUTH EVERY DAY, Disp: 90 tablet, Rfl: 3 .  mupirocin ointment (BACTROBAN) 2 %, SMARTSIG:1 Application Topical 2-3 Times Daily, Disp: , Rfl:   EXAM:  GENERAL: alert, oriented, appears well and in no acute distress  HEENT: atraumatic, conjunttiva clear, no obvious abnormalities on inspection of external nose and ears  NECK: normal movements of the head and neck  LUNGS: on inspection no signs of respiratory distress, breathing rate appears normal, no obvious gross SOB, gasping or wheezing  CV: no obvious cyanosis  PSYCH/NEURO: pleasant and cooperative, no obvious depression or anxiety, speech and thought processing grossly intact  ASSESSMENT AND PLAN:  Discussed the following assessment and plan:  Problem List Items Addressed This Visit    Anxiety    Doing well on citalopram.  Follow.        Essential  hypertension, benign    Blood pressure has been doing well.  Continue losartan/hctz and amlodipine.  Follow pressures.  Follow metabolic panel.       Health care maintenance   Hypercholesterolemia    Discussed calculated cholesterol risk. Discussed recommendation for starting a cholesterol medication.  She declines.  Low cholesterol diet and exercise.  Follow lipid panel.       Sinusitis    Recent congestion and symptoms as outlined.   Improved.  Robitussin DM and nasal spray as directed.  Discussed covid testing.  She declines.  Follow.  Call with update.           I discussed the assessment and treatment plan with the patient. The patient was provided an opportunity to ask questions and all were answered. The patient agreed with the plan and demonstrated an understanding of the instructions.   The patient was advised to call back or seek an in-person evaluation if the symptoms worsen or if the condition fails to improve as anticipated.    Dale Los Veteranos I, MD

## 2020-10-16 ENCOUNTER — Telehealth: Payer: Self-pay | Admitting: Internal Medicine

## 2020-10-16 NOTE — Assessment & Plan Note (Signed)
Blood pressure has been doing well.  Continue losartan/hctz and amlodipine.  Follow pressures.  Follow metabolic panel.

## 2020-10-16 NOTE — Assessment & Plan Note (Signed)
Discussed calculated cholesterol risk. Discussed recommendation for starting a cholesterol medication.  She declines.  Low cholesterol diet and exercise.  Follow lipid panel.

## 2020-10-16 NOTE — Assessment & Plan Note (Signed)
Recent congestion and symptoms as outlined.  Improved.  Robitussin DM and nasal spray as directed.  Discussed covid testing.  She declines.  Follow.  Call with update.

## 2020-10-16 NOTE — Assessment & Plan Note (Signed)
Doing well on citalopram.  Follow.   

## 2020-10-16 NOTE — Telephone Encounter (Signed)
Schedule fasting lab appt in 4 weeks.  Thanks

## 2020-10-17 NOTE — Telephone Encounter (Signed)
Called and scheduled pt

## 2020-11-16 ENCOUNTER — Other Ambulatory Visit: Payer: BC Managed Care – PPO

## 2020-12-06 ENCOUNTER — Other Ambulatory Visit (INDEPENDENT_AMBULATORY_CARE_PROVIDER_SITE_OTHER): Payer: BC Managed Care – PPO

## 2020-12-06 ENCOUNTER — Other Ambulatory Visit: Payer: Self-pay

## 2020-12-06 DIAGNOSIS — E7800 Pure hypercholesterolemia, unspecified: Secondary | ICD-10-CM | POA: Diagnosis not present

## 2020-12-06 DIAGNOSIS — I10 Essential (primary) hypertension: Secondary | ICD-10-CM

## 2020-12-06 LAB — CBC WITH DIFFERENTIAL/PLATELET
Basophils Absolute: 0.1 10*3/uL (ref 0.0–0.1)
Basophils Relative: 1.1 % (ref 0.0–3.0)
Eosinophils Absolute: 0.4 10*3/uL (ref 0.0–0.7)
Eosinophils Relative: 6.7 % — ABNORMAL HIGH (ref 0.0–5.0)
HCT: 42.9 % (ref 36.0–46.0)
Hemoglobin: 14.5 g/dL (ref 12.0–15.0)
Lymphocytes Relative: 25.6 % (ref 12.0–46.0)
Lymphs Abs: 1.7 10*3/uL (ref 0.7–4.0)
MCHC: 33.9 g/dL (ref 30.0–36.0)
MCV: 89.7 fl (ref 78.0–100.0)
Monocytes Absolute: 0.4 10*3/uL (ref 0.1–1.0)
Monocytes Relative: 6.1 % (ref 3.0–12.0)
Neutro Abs: 4 10*3/uL (ref 1.4–7.7)
Neutrophils Relative %: 60.5 % (ref 43.0–77.0)
Platelets: 273 10*3/uL (ref 150.0–400.0)
RBC: 4.78 Mil/uL (ref 3.87–5.11)
RDW: 14.1 % (ref 11.5–15.5)
WBC: 6.6 10*3/uL (ref 4.0–10.5)

## 2020-12-06 LAB — COMPREHENSIVE METABOLIC PANEL
ALT: 11 U/L (ref 0–35)
AST: 14 U/L (ref 0–37)
Albumin: 4.1 g/dL (ref 3.5–5.2)
Alkaline Phosphatase: 83 U/L (ref 39–117)
BUN: 11 mg/dL (ref 6–23)
CO2: 32 mEq/L (ref 19–32)
Calcium: 9.4 mg/dL (ref 8.4–10.5)
Chloride: 102 mEq/L (ref 96–112)
Creatinine, Ser: 0.81 mg/dL (ref 0.40–1.20)
GFR: 77.19 mL/min (ref >60.00)
Glucose, Bld: 97 mg/dL (ref 70–99)
Potassium: 4 mEq/L (ref 3.5–5.1)
Sodium: 141 mEq/L (ref 135–145)
Total Bilirubin: 0.5 mg/dL (ref 0.2–1.2)
Total Protein: 6.9 g/dL (ref 6.0–8.3)

## 2020-12-06 LAB — LIPID PANEL
Cholesterol: 215 mg/dL — ABNORMAL HIGH (ref 0–200)
HDL: 40.6 mg/dL (ref >39.00)
LDL Cholesterol: 155 mg/dL — ABNORMAL HIGH (ref 0–99)
NonHDL: 174.77
Total CHOL/HDL Ratio: 5
Triglycerides: 101 mg/dL (ref 0.0–149.0)
VLDL: 20.2 mg/dL (ref 0.0–40.0)

## 2020-12-06 LAB — TSH: TSH: 1.41 u[IU]/mL (ref 0.35–4.50)

## 2021-01-10 ENCOUNTER — Ambulatory Visit: Payer: BC Managed Care – PPO | Admitting: Internal Medicine

## 2021-01-10 ENCOUNTER — Encounter: Payer: Self-pay | Admitting: Internal Medicine

## 2021-01-10 ENCOUNTER — Other Ambulatory Visit: Payer: Self-pay

## 2021-01-10 DIAGNOSIS — E538 Deficiency of other specified B group vitamins: Secondary | ICD-10-CM

## 2021-01-10 DIAGNOSIS — I10 Essential (primary) hypertension: Secondary | ICD-10-CM

## 2021-01-10 DIAGNOSIS — F419 Anxiety disorder, unspecified: Secondary | ICD-10-CM | POA: Diagnosis not present

## 2021-01-10 DIAGNOSIS — E7800 Pure hypercholesterolemia, unspecified: Secondary | ICD-10-CM

## 2021-01-10 NOTE — Progress Notes (Signed)
Patient ID: Kim Love, female   DOB: 1956/09/05, 65 y.o.   MRN: [id number]   Subjective:    Patient ID: Kim Love, female    DOB: October 11, 1955, 65 y.o.   MRN: [id number]  HPI This visit occurred during the SARS-CoV-2 public health emergency.  Safety protocols were in place, including screening questions prior to the visit, additional usage of staff PPE, and extensive cleaning of exam room while observing appropriate contact time as indicated for disinfecting solutions.  Patient here for a scheduled follow up.  Here to follow up regarding her blood pressure and cholesterol.  She reports she is doing well.  Feels good.  Increased stress.  Discussed.  Overall she feels she is handling things relatively well. No chest pain or sob. No acid reflux. No abdominal pain.  Bowels moving.     Past Medical History:  Diagnosis Date  . Anxiety   . Hypercholesterolemia   . Hypertension    Past Surgical History:  Procedure Laterality Date  . TONSILLECTOMY AND ADENOIDECTOMY Bilateral 1980   Family History  Problem Relation Age of Onset  . Stroke Maternal Grandmother    Social History   Socioeconomic History  . Marital status: Married    Spouse name: Not on file  . Number of children: 1  . Years of education: Not on file  . Highest education level: Not on file  Occupational History    Employer: [employer]  Tobacco Use  . Smoking status: Former Smoker    Types: Cigarettes    Quit date: 09/10/2012    Years since quitting: 8.3  . Smokeless tobacco: Current User  Substance and Sexual Activity  . Alcohol use: No    Alcohol/week: 0.0 standard drinks  . Drug use: No  . Sexual activity: Not on file  Other Topics Concern  . Not on file  Social History Narrative  . Not on file   Social Determinants of Health   Financial Resource Strain: Not on file  Food Insecurity: Not on file  Transportation Needs: Not on file  Physical Activity: Not on file  Stress: Not on file  Social  Connections: Not on file    Outpatient Encounter Medications as of 01/10/2021  Medication Sig  . amLODipine (NORVASC) 5 MG tablet TAKE 1 TABLET BY MOUTH EVERY DAY  . Cholecalciferol (VITAMIN D-3 PO) Take by mouth.  . citalopram (CELEXA) 20 MG tablet TAKE 1 TABLET BY MOUTH EVERY DAY  . fluticasone (FLONASE) 50 MCG/ACT nasal spray Place 2 sprays into both nostrils daily.  . hydrocortisone-pramoxine (ANALPRAM HC) 2.5-1 % rectal cream Place 1 application rectally 2 (two) times daily as needed for hemorrhoids or anal itching.  . losartan-hydrochlorothiazide (HYZAAR) 100-25 MG tablet TAKE 1 TABLET BY MOUTH EVERY DAY  . mupirocin ointment (BACTROBAN) 2 % SMARTSIG:1 Application Topical 2-3 Times Daily   No facility-administered encounter medications on file as of 01/10/2021.    Review of Systems  Constitutional: Negative for appetite change and unexpected weight change.  HENT: Negative for congestion and sinus pressure.   Respiratory: Negative for cough, chest tightness and shortness of breath.   Cardiovascular: Negative for chest pain, palpitations and leg swelling.  Gastrointestinal: Negative for abdominal pain, diarrhea, nausea and vomiting.  Genitourinary: Negative for difficulty urinating and dysuria.  Musculoskeletal: Negative for joint swelling and myalgias.  Skin: Negative for color change and rash.  Neurological: Negative for dizziness, light-headedness and headaches.  Psychiatric/Behavioral: Negative for agitation and dysphoric mood.  Objective:    Physical Exam Vitals reviewed.  Constitutional:      General: She is not in acute distress.    Appearance: Normal appearance.  HENT:     Head: Normocephalic and atraumatic.     Right Ear: External ear normal.     Left Ear: External ear normal.  Eyes:     General: No scleral icterus.       Right eye: No discharge.        Left eye: No discharge.     Conjunctiva/sclera: Conjunctivae normal.  Neck:     Thyroid: No thyromegaly.   Cardiovascular:     Rate and Rhythm: Normal rate and regular rhythm.  Pulmonary:     Effort: No respiratory distress.     Breath sounds: Normal breath sounds. No wheezing.  Abdominal:     General: Bowel sounds are normal.     Palpations: Abdomen is soft.     Tenderness: There is no abdominal tenderness.  Musculoskeletal:        General: No swelling or tenderness.     Cervical back: Neck supple. No tenderness.  Lymphadenopathy:     Cervical: No cervical adenopathy.  Skin:    Findings: No erythema or rash.  Neurological:     Mental Status: She is alert.  Psychiatric:        Mood and Affect: Mood normal.        Behavior: Behavior normal.     BP 118/72   Pulse 85   Temp (!) 96.4 F (35.8 C)   Ht 5' 2.01" (1.575 m)   Wt 140 lb (63.5 kg)   SpO2 95%   BMI 25.60 kg/m  Wt Readings from Last 3 Encounters:  01/10/21 140 lb (63.5 kg)  10/11/20 139 lb (63 kg)  06/10/20 141 lb 12.8 oz (64.3 kg)     Lab Results  Component Value Date   WBC 6.6 12/06/2020   HGB 14.5 12/06/2020   HCT 42.9 12/06/2020   PLT 273.0 12/06/2020   GLUCOSE 97 12/06/2020   CHOL 215 (H) 12/06/2020   TRIG 101.0 12/06/2020   HDL 40.60 12/06/2020   LDLDIRECT 129.0 03/26/2018   LDLCALC 155 (H) 12/06/2020   ALT 11 12/06/2020   AST 14 12/06/2020   NA 141 12/06/2020   K 4.0 12/06/2020   CL 102 12/06/2020   CREATININE 0.81 12/06/2020   BUN 11 12/06/2020   CO2 32 12/06/2020   TSH 1.41 12/06/2020    US BREAST LTD UNI LEFT INC AXILLA  Result Date: 01/27/2020 CLINICAL DATA:  Patient returns after screening study for evaluation of possible LEFT breast mass. EXAM: DIGITAL DIAGNOSTIC LEFT MAMMOGRAM WITH CAD AND TOMO ULTRASOUND LEFT BREAST COMPARISON:  01/14/2020 and earlier ACR Breast Density Category b: There are scattered areas of fibroglandular density. FINDINGS: Additional 2-D and 3-D images are performed. These views confirm presence of a circumscribed oval mass in the LATERAL aspect of the LEFT breast,  posterior depth. Mammographic images were processed with CAD. Targeted ultrasound is performed, showing a cyst with single internal septation in the 3 o'clock location of the LEFT breast 3 centimeters from the nipple measuring 0.8 x 0.3 x 0.7 centimeters. No solid component or internal blood flow. IMPRESSION: Benign cyst in the LEFT breast. No mammographic or ultrasound evidence for malignancy. RECOMMENDATION: Screening mammogram in one year.(Code:SM-B-01Y) I have discussed the findings and recommendations with the patient. If applicable, a reminder letter will be sent to the patient regarding the next appointment. BI-RADS CATEGORY  2: Benign. Electronically Signed   By: Norva Pavlov M.D.   On: 01/25/2020 14:31   MM DIAG BREAST TOMO UNI LEFT  Result Date: 01/25/2020 CLINICAL DATA:  Patient returns after screening study for evaluation of possible LEFT breast mass. EXAM: DIGITAL DIAGNOSTIC LEFT MAMMOGRAM WITH CAD AND TOMO ULTRASOUND LEFT BREAST COMPARISON:  01/14/2020 and earlier ACR Breast Density Category b: There are scattered areas of fibroglandular density. FINDINGS: Additional 2-D and 3-D images are performed. These views confirm presence of a circumscribed oval mass in the LATERAL aspect of the LEFT breast, posterior depth. Mammographic images were processed with CAD. Targeted ultrasound is performed, showing a cyst with single internal septation in the 3 o'clock location of the LEFT breast 3 centimeters from the nipple measuring 0.8 x 0.3 x 0.7 centimeters. No solid component or internal blood flow. IMPRESSION: Benign cyst in the LEFT breast. No mammographic or ultrasound evidence for malignancy. RECOMMENDATION: Screening mammogram in one year.(Code:SM-B-01Y) I have discussed the findings and recommendations with the patient. If applicable, a reminder letter will be sent to the patient regarding the next appointment. BI-RADS CATEGORY  2: Benign. Electronically Signed   By: Norva Pavlov M.D.   On:  01/25/2020 14:31       Assessment & Plan:   Problem List Items Addressed This Visit    Anxiety    Continue citalopram.  Doing well. No changes.  Follow.       B12 deficiency    Continue oral B12.        Essential hypertension, benign    Blood pressure has been doing well.  Continue losartan/hctz and amlodipine.  Follow pressures.  No headache or dizziness.  No light headedness.  Follow metabolic panel.        Hypercholesterolemia    The 10-year ASCVD risk score Denman George DC Montez Hageman., et al., 2013) is: 3.9%   Values used to calculate the score:     Age: 40 years     Sex: Female     Is Non-Hispanic African American: No     Diabetic: No     Tobacco smoker: No     Systolic Blood Pressure: 92 mmHg     Is BP treated: Yes     HDL Cholesterol: 40.6 mg/dL     Total Cholesterol: 215 mg/dL  Low cholesterol diet and exercise.  Follow lipid panel.       Relevant Orders   Lipid panel   Comprehensive metabolic panel       Dale Antrim, MD

## 2021-01-10 NOTE — Assessment & Plan Note (Addendum)
The 10-year ASCVD risk score Denman George DC Montez Hageman., et al., 2013) is: 3.9%   Values used to calculate the score:     Age: 64 years     Sex: Female     Is Non-Hispanic African American: No     Diabetic: No     Tobacco smoker: No     Systolic Blood Pressure: 92 mmHg     Is BP treated: Yes     HDL Cholesterol: 40.6 mg/dL     Total Cholesterol: 215 mg/dL  Low cholesterol diet and exercise.  Follow lipid panel.

## 2021-01-15 ENCOUNTER — Encounter: Payer: Self-pay | Admitting: Internal Medicine

## 2021-01-15 NOTE — Assessment & Plan Note (Signed)
Continue oral B12.  

## 2021-01-15 NOTE — Assessment & Plan Note (Signed)
Continue citalopram.  Doing well. No changes.  Follow.

## 2021-01-15 NOTE — Assessment & Plan Note (Signed)
Blood pressure has been doing well.  Continue losartan/hctz and amlodipine.  Follow pressures.  No headache or dizziness.  No light headedness.  Follow metabolic panel.

## 2021-03-17 ENCOUNTER — Other Ambulatory Visit: Payer: Self-pay | Admitting: Internal Medicine

## 2021-03-25 ENCOUNTER — Other Ambulatory Visit: Payer: Self-pay | Admitting: Internal Medicine

## 2021-04-11 ENCOUNTER — Other Ambulatory Visit (INDEPENDENT_AMBULATORY_CARE_PROVIDER_SITE_OTHER): Payer: BC Managed Care – PPO

## 2021-04-11 ENCOUNTER — Other Ambulatory Visit: Payer: Self-pay

## 2021-04-11 DIAGNOSIS — E7800 Pure hypercholesterolemia, unspecified: Secondary | ICD-10-CM

## 2021-04-11 LAB — LIPID PANEL
Cholesterol: 202 mg/dL — ABNORMAL HIGH (ref 0–200)
HDL: 40 mg/dL (ref >39.00)
LDL Cholesterol: 142 mg/dL — ABNORMAL HIGH (ref 0–99)
NonHDL: 162.21
Total CHOL/HDL Ratio: 5
Triglycerides: 102 mg/dL (ref 0.0–149.0)
VLDL: 20.4 mg/dL (ref 0.0–40.0)

## 2021-04-11 LAB — COMPREHENSIVE METABOLIC PANEL
ALT: 8 U/L (ref 0–35)
AST: 12 U/L (ref 0–37)
Albumin: 4 g/dL (ref 3.5–5.2)
Alkaline Phosphatase: 84 U/L (ref 39–117)
BUN: 10 mg/dL (ref 6–23)
CO2: 28 mEq/L (ref 19–32)
Calcium: 8.8 mg/dL (ref 8.4–10.5)
Chloride: 100 mEq/L (ref 96–112)
Creatinine, Ser: 0.81 mg/dL (ref 0.40–1.20)
GFR: 77 mL/min (ref >60.00)
Glucose, Bld: 90 mg/dL (ref 70–99)
Potassium: 3.3 mEq/L — ABNORMAL LOW (ref 3.5–5.1)
Sodium: 138 mEq/L (ref 135–145)
Total Bilirubin: 0.5 mg/dL (ref 0.2–1.2)
Total Protein: 7.1 g/dL (ref 6.0–8.3)

## 2021-04-13 ENCOUNTER — Ambulatory Visit: Payer: BC Managed Care – PPO | Admitting: Internal Medicine

## 2021-04-13 ENCOUNTER — Other Ambulatory Visit: Payer: Self-pay

## 2021-04-13 ENCOUNTER — Encounter: Payer: Self-pay | Admitting: Internal Medicine

## 2021-04-13 DIAGNOSIS — F419 Anxiety disorder, unspecified: Secondary | ICD-10-CM | POA: Diagnosis not present

## 2021-04-13 DIAGNOSIS — I10 Essential (primary) hypertension: Secondary | ICD-10-CM

## 2021-04-13 DIAGNOSIS — E559 Vitamin D deficiency, unspecified: Secondary | ICD-10-CM

## 2021-04-13 DIAGNOSIS — E538 Deficiency of other specified B group vitamins: Secondary | ICD-10-CM

## 2021-04-13 DIAGNOSIS — E876 Hypokalemia: Secondary | ICD-10-CM

## 2021-04-13 DIAGNOSIS — E7800 Pure hypercholesterolemia, unspecified: Secondary | ICD-10-CM

## 2021-04-13 NOTE — Patient Instructions (Signed)
Take celexa one tablet alternating with 1/2 tablet every other day for 10 days and then start 1/2 tablet per day.   Call with update.

## 2021-04-13 NOTE — Assessment & Plan Note (Addendum)
The 10-year ASCVD risk score Denman George DC Montez Hageman., et al., 2013) is: 6.3%   Values used to calculate the score:     Age: 65 years     Sex: Female     Is Non-Hispanic African American: No     Diabetic: No     Tobacco smoker: No     Systolic Blood Pressure: 120 mmHg     Is BP treated: Yes     HDL Cholesterol: 40 mg/dL     Total Cholesterol: 202 mg/dL  Low cholesterol diet and exercise.  Follow lipid panel.

## 2021-04-13 NOTE — Progress Notes (Signed)
Patient ID: Kim Love, female   DOB: 04/29/1956, 65 y.o.   MRN: [id number]   Subjective:    Patient ID: Kim Love, female    DOB: 02/22/1956, 65 y.o.   MRN: [id number]  HPI This visit occurred during the SARS-CoV-2 public health emergency.  Safety protocols were in place, including screening questions prior to the visit, additional usage of staff PPE, and extensive cleaning of exam room while observing appropriate contact time as indicated for disinfecting solutions.   Patient here for a scheduled follow up.  Here to follow up regarding her blood pressure and increased stress.  Stays active.  No chest pain or sob reported.  No abdominal pain or bowel change reported.  Eating.  No nausea or vomiting.  No abdominal pain.  Bowels moving.  Blood pressure - ok.  Would like to taper off citalopram.  Doing well and does not feel she needs to be on the medication.    Past Medical History:  Diagnosis Date   Anxiety    Hypercholesterolemia    Hypertension    Past Surgical History:  Procedure Laterality Date   TONSILLECTOMY AND ADENOIDECTOMY Bilateral 1980   Family History  Problem Relation Age of Onset   Stroke Maternal Grandmother    Social History   Socioeconomic History   Marital status: Married    Spouse name: Not on file   Number of children: 1   Years of education: Not on file   Highest education level: Not on file  Occupational History    Employer: [employer]  Tobacco Use   Smoking status: Former    Types: Cigarettes    Quit date: 09/10/2012    Years since quitting: 8.6   Smokeless tobacco: Current  Substance and Sexual Activity   Alcohol use: No    Alcohol/week: 0.0 standard drinks   Drug use: No   Sexual activity: Not on file  Other Topics Concern   Not on file  Social History Narrative   Not on file   Social Determinants of Health   Financial Resource Strain: Not on file  Food Insecurity: Not on file  Transportation Needs: Not on file  Physical Activity:  Not on file  Stress: Not on file  Social Connections: Not on file    Review of Systems  Constitutional:  Negative for appetite change and unexpected weight change.  HENT:  Negative for congestion and sinus pressure.   Respiratory:  Negative for cough, chest tightness and shortness of breath.   Cardiovascular:  Negative for chest pain, palpitations and leg swelling.  Gastrointestinal:  Negative for abdominal pain, diarrhea, nausea and vomiting.  Genitourinary:  Negative for difficulty urinating and dysuria.  Musculoskeletal:  Negative for joint swelling and myalgias.  Skin:  Negative for rash.  Neurological:  Negative for dizziness, light-headedness and headaches.  Psychiatric/Behavioral:  Negative for agitation and dysphoric mood.       Objective:    Physical Exam Vitals reviewed.  Constitutional:      General: She is not in acute distress.    Appearance: Normal appearance.  HENT:     Head: Normocephalic and atraumatic.     Right Ear: External ear normal.     Left Ear: External ear normal.  Eyes:     General: No scleral icterus.       Right eye: No discharge.        Left eye: No discharge.     Conjunctiva/sclera: Conjunctivae normal.  Neck:  Thyroid: No thyromegaly.  Cardiovascular:     Rate and Rhythm: Normal rate and regular rhythm.  Pulmonary:     Effort: No respiratory distress.     Breath sounds: Normal breath sounds. No wheezing.  Abdominal:     General: Bowel sounds are normal.     Palpations: Abdomen is soft.     Tenderness: There is no abdominal tenderness.  Musculoskeletal:        General: No swelling or tenderness.     Cervical back: Neck supple. No tenderness.  Lymphadenopathy:     Cervical: No cervical adenopathy.  Skin:    Findings: No erythema or rash.  Neurological:     Mental Status: She is alert.  Psychiatric:        Mood and Affect: Mood normal.        Behavior: Behavior normal.    BP 120/78   Pulse 79   Temp 98.4 F (36.9 C)   Ht  5' 2.01" (1.575 m)   Wt 136 lb 12.8 oz (62.1 kg)   SpO2 98%   BMI 25.01 kg/m  Wt Readings from Last 3 Encounters:  04/13/21 136 lb 12.8 oz (62.1 kg)  01/10/21 140 lb (63.5 kg)  10/11/20 139 lb (63 kg)    Outpatient Encounter Medications as of 04/13/2021  Medication Sig   amLODipine (NORVASC) 5 MG tablet TAKE 1 TABLET BY MOUTH EVERY DAY   citalopram (CELEXA) 20 MG tablet TAKE 1 TABLET BY MOUTH EVERY DAY   fluticasone (FLONASE) 50 MCG/ACT nasal spray Place 2 sprays into both nostrils daily.   hydrocortisone-pramoxine (ANALPRAM HC) 2.5-1 % rectal cream Place 1 application rectally 2 (two) times daily as needed for hemorrhoids or anal itching.   losartan-hydrochlorothiazide (HYZAAR) 100-25 MG tablet TAKE 1 TABLET BY MOUTH EVERY DAY   mupirocin ointment (BACTROBAN) 2 % SMARTSIG:1 Application Topical 2-3 Times Daily   vitamin B-12 (CYANOCOBALAMIN) 100 MCG tablet Vitamin B12   [DISCONTINUED] Cholecalciferol (VITAMIN D-3 PO) Take by mouth daily.   No facility-administered encounter medications on file as of 04/13/2021.     Lab Results  Component Value Date   WBC 6.6 12/06/2020   HGB 14.5 12/06/2020   HCT 42.9 12/06/2020   PLT 273.0 12/06/2020   GLUCOSE 90 04/11/2021   CHOL 202 (H) 04/11/2021   TRIG 102.0 04/11/2021   HDL 40.00 04/11/2021   LDLDIRECT 129.0 03/26/2018   LDLCALC 142 (H) 04/11/2021   ALT 8 04/11/2021   AST 12 04/11/2021   NA 138 04/11/2021   K 3.3 (L) 04/11/2021   CL 100 04/11/2021   CREATININE 0.81 04/11/2021   BUN 10 04/11/2021   CO2 28 04/11/2021   TSH 1.41 12/06/2020    US BREAST LTD UNI LEFT INC AXILLA  Result Date: 01/27/2020 CLINICAL DATA:  Patient returns after screening study for evaluation of possible LEFT breast mass. EXAM: DIGITAL DIAGNOSTIC LEFT MAMMOGRAM WITH CAD AND TOMO ULTRASOUND LEFT BREAST COMPARISON:  01/14/2020 and earlier ACR Breast Density Category b: There are scattered areas of fibroglandular density. FINDINGS: Additional 2-D and 3-D images  are performed. These views confirm presence of a circumscribed oval mass in the LATERAL aspect of the LEFT breast, posterior depth. Mammographic images were processed with CAD. Targeted ultrasound is performed, showing a cyst with single internal septation in the 3 o'clock location of the LEFT breast 3 centimeters from the nipple measuring 0.8 x 0.3 x 0.7 centimeters. No solid component or internal blood flow. IMPRESSION: Benign cyst in the LEFT breast. No  mammographic or ultrasound evidence for malignancy. RECOMMENDATION: Screening mammogram in one year.(Code:SM-B-01Y) I have discussed the findings and recommendations with the patient. If applicable, a reminder letter will be sent to the patient regarding the next appointment. BI-RADS CATEGORY  2: Benign. Electronically Signed   By: Norva Pavlov M.D.   On: 01/25/2020 14:31   MM DIAG BREAST TOMO UNI LEFT  Result Date: 01/25/2020 CLINICAL DATA:  Patient returns after screening study for evaluation of possible LEFT breast mass. EXAM: DIGITAL DIAGNOSTIC LEFT MAMMOGRAM WITH CAD AND TOMO ULTRASOUND LEFT BREAST COMPARISON:  01/14/2020 and earlier ACR Breast Density Category b: There are scattered areas of fibroglandular density. FINDINGS: Additional 2-D and 3-D images are performed. These views confirm presence of a circumscribed oval mass in the LATERAL aspect of the LEFT breast, posterior depth. Mammographic images were processed with CAD. Targeted ultrasound is performed, showing a cyst with single internal septation in the 3 o'clock location of the LEFT breast 3 centimeters from the nipple measuring 0.8 x 0.3 x 0.7 centimeters. No solid component or internal blood flow. IMPRESSION: Benign cyst in the LEFT breast. No mammographic or ultrasound evidence for malignancy. RECOMMENDATION: Screening mammogram in one year.(Code:SM-[id number]) I have discussed the findings and recommendations with the patient. If applicable, a reminder letter will be sent to the patient  regarding the next appointment. BI-RADS CATEGORY  2: Benign. Electronically Signed   By: Norva Pavlov M.D.   On: 01/25/2020 14:31       Assessment & Plan:   Problem List Items Addressed This Visit     Anxiety    Has been on citalopram.  Doing well.  Plan to taper citalopram.  Follow.         B12 deficiency    Continues B12 supplements.  Check B12 level with next labs.         Essential hypertension, benign    Blood pressure has been doing well.  Continue losartan/hctz and amlodipine.  Follow pressures.  No headache or dizziness.  No light headedness.  Follow metabolic panel.         Hypercholesterolemia    The 10-year ASCVD risk score Denman George DC Montez Hageman., et al., 2013) is: 6.3%   Values used to calculate the score:     Age: 29 years     Sex: Female     Is Non-Hispanic African American: No     Diabetic: No     Tobacco smoker: No     Systolic Blood Pressure: 120 mmHg     Is BP treated: Yes     HDL Cholesterol: 40 mg/dL     Total Cholesterol: 202 mg/dL  Low cholesterol diet and exercise.  Follow lipid panel.        Hypokalemia    Potassium slightly decreased on recent labs.  Information given on foods with increased potassium.  Recheck potassium in 10-14 days.        Relevant Orders   Potassium   Vitamin D deficiency    Check vitamin D level next labs.          Dale Newport, MD

## 2021-04-17 ENCOUNTER — Encounter: Payer: Self-pay | Admitting: Internal Medicine

## 2021-04-17 DIAGNOSIS — E876 Hypokalemia: Secondary | ICD-10-CM | POA: Insufficient documentation

## 2021-04-17 NOTE — Assessment & Plan Note (Signed)
Blood pressure has been doing well.  Continue losartan/hctz and amlodipine.  Follow pressures.  No headache or dizziness.  No light headedness.  Follow metabolic panel.   

## 2021-04-17 NOTE — Assessment & Plan Note (Signed)
Check vitamin D level next labs.  

## 2021-04-17 NOTE — Assessment & Plan Note (Addendum)
Has been on citalopram.  Doing well.  Plan to taper citalopram.  Follow.

## 2021-04-17 NOTE — Assessment & Plan Note (Addendum)
Continues B12 supplements.  Check B12 level with next labs.   

## 2021-04-17 NOTE — Assessment & Plan Note (Signed)
Potassium slightly decreased on recent labs.  Information given on foods with increased potassium.  Recheck potassium in 10-14 days.

## 2021-04-19 ENCOUNTER — Telehealth: Payer: Self-pay | Admitting: Internal Medicine

## 2021-04-19 NOTE — Telephone Encounter (Signed)
Carrie from [facility] called to let Dr.Scott know that the patient did not want to go to urgent care or the emergency department and wants an antibiotic called in.

## 2021-04-19 NOTE — Telephone Encounter (Signed)
Patient was last seen 04-13-21. Believes she has a sinus infection. Refused UC/ED. She only want ABX called in. Please see previous notes.

## 2021-04-19 NOTE — Telephone Encounter (Signed)
Patient informed, Due to the high volume of calls and your symptoms we have to forward your call to our Triage Nurse to expedient your call. Please hold for the transfer.  Patient transferred to [facility] at [facility]. Due to knowing she has a sinus infection and wanting an antibiotic sent in.No openings available in office or virtual.

## 2021-04-20 NOTE — Telephone Encounter (Signed)
Called patient , unable to leave message

## 2021-04-20 NOTE — Telephone Encounter (Signed)
Please inform her I am not in the office.  Will return next week.  Reviewed access nurse note.  She is having cough, congestion, etc.  Given symptoms, need appt to help confirm diagnosis.  (She feels is sinus.  Need to make sure, covid, URI, etc).  Recommend evaluation and then can determine best treatment.

## 2021-04-25 ENCOUNTER — Other Ambulatory Visit: Payer: Self-pay

## 2021-04-25 ENCOUNTER — Other Ambulatory Visit (INDEPENDENT_AMBULATORY_CARE_PROVIDER_SITE_OTHER): Payer: BC Managed Care – PPO

## 2021-04-25 DIAGNOSIS — E876 Hypokalemia: Secondary | ICD-10-CM | POA: Diagnosis not present

## 2021-04-25 LAB — POTASSIUM: Potassium: 4 mEq/L (ref 3.5–5.1)

## 2021-05-25 ENCOUNTER — Telehealth: Payer: Self-pay | Admitting: Internal Medicine

## 2021-05-25 NOTE — Telephone Encounter (Signed)
Reviewed message.  It appears that she had dizziness earlier in the week and now not having symptoms?  Please confirm what symptoms were initially and if this is correct - no symptoms now.  Not sure what her blood pressure was when occurred - today apparently 140/70s.  Will need her to continue to spot check her pressure.  If any acute or persistent symptoms, needs to be seen.  Can schedule f/u appt with me

## 2021-05-25 NOTE — Telephone Encounter (Signed)
[facility] stated [facility] and had a lot of fluid behind both eardrums. She stated she will spot check bp.

## 2021-05-25 NOTE — Telephone Encounter (Signed)
See my previous note.  Does she want f/u appt or need anything more?

## 2021-05-25 NOTE — Telephone Encounter (Signed)
Patient informed, Due to the high volume of calls and your symptoms we have to forward your call to our Triage Nurse to expedient your call. Please hold for the transfer.  Patient transferred to [facility] at [facility] . Due to having balance issues and possible vertigo.No openings at our office,offered to schedule patient with another [place] office and pt declined.Patient dropped call while waiting on [facility] transfer,Karen from [facility] will be giving the patient a call to triage her.

## 2021-05-25 NOTE — Telephone Encounter (Signed)
Reason for Triage Call /in person:Vertigo on monday  Symptoms:Absent  Pain Scale:none, location, n/a, and level of pain (1-10, 10 severe), 0  Vitals: 05-24-21  BP: 140/76 Onset: Monday  Duration:for two days  Medications:medication not used  Last seen for this problem:Never  Outcome: Access nurse instructed patient to call for appointment. No current sx per access nurse. Attach [facility] Triage Note.

## 2021-05-26 NOTE — Telephone Encounter (Signed)
She stated she will wait and see what the Flonase and zyrtec do for her sx.

## 2021-05-28 ENCOUNTER — Emergency Department (HOSPITAL_COMMUNITY)
Admission: EM | Admit: 2021-05-28 | Discharge: 2021-05-28 | Discharge status: Against Medical Advice | Payer: BC Managed Care – PPO | Attending: Emergency Medicine | Admitting: Emergency Medicine

## 2021-05-28 ENCOUNTER — Other Ambulatory Visit: Payer: Self-pay

## 2021-05-28 ENCOUNTER — Encounter (HOSPITAL_COMMUNITY): Payer: Self-pay | Admitting: Emergency Medicine

## 2021-05-28 ENCOUNTER — Emergency Department (HOSPITAL_COMMUNITY): Payer: BC Managed Care – PPO

## 2021-05-28 DIAGNOSIS — R791 Abnormal coagulation profile: Secondary | ICD-10-CM | POA: Insufficient documentation

## 2021-05-28 DIAGNOSIS — Z79899 Other long term (current) drug therapy: Secondary | ICD-10-CM | POA: Insufficient documentation

## 2021-05-28 DIAGNOSIS — N898 Other specified noninflammatory disorders of vagina: Secondary | ICD-10-CM | POA: Diagnosis not present

## 2021-05-28 DIAGNOSIS — Z87891 Personal history of nicotine dependence: Secondary | ICD-10-CM | POA: Diagnosis not present

## 2021-05-28 DIAGNOSIS — I10 Essential (primary) hypertension: Secondary | ICD-10-CM | POA: Diagnosis not present

## 2021-05-28 DIAGNOSIS — R42 Dizziness and giddiness: Secondary | ICD-10-CM | POA: Diagnosis not present

## 2021-05-28 DIAGNOSIS — H748X2 Other specified disorders of left middle ear and mastoid: Secondary | ICD-10-CM | POA: Insufficient documentation

## 2021-05-28 DIAGNOSIS — Z20822 Contact with and (suspected) exposure to covid-19: Secondary | ICD-10-CM | POA: Diagnosis not present

## 2021-05-28 DIAGNOSIS — H538 Other visual disturbances: Secondary | ICD-10-CM

## 2021-05-28 LAB — COMPREHENSIVE METABOLIC PANEL
ALT: 11 U/L (ref 0–44)
AST: 15 U/L (ref 15–41)
Albumin: 4 g/dL (ref 3.5–5.0)
Alkaline Phosphatase: 90 U/L (ref 38–126)
Anion gap: 7 (ref 5–15)
BUN: 11 mg/dL (ref 8–23)
CO2: 29 mmol/L (ref 22–32)
Calcium: 9.4 mg/dL (ref 8.9–10.3)
Chloride: 108 mmol/L (ref 98–111)
Creatinine, Ser: 0.88 mg/dL (ref 0.44–1.00)
GFR, Estimated: >60 mL/min (ref >60)
Glucose, Bld: 118 mg/dL — ABNORMAL HIGH (ref 70–99)
Potassium: 3.2 mmol/L — ABNORMAL LOW (ref 3.5–5.1)
Sodium: 144 mmol/L (ref 135–145)
Total Bilirubin: 0.5 mg/dL (ref 0.3–1.2)
Total Protein: 7.7 g/dL (ref 6.5–8.1)

## 2021-05-28 LAB — DIFFERENTIAL
Abs Immature Granulocytes: 0.01 10*3/uL (ref 0.00–0.07)
Basophils Absolute: 0 10*3/uL (ref 0.0–0.1)
Basophils Relative: 1 %
Eosinophils Absolute: 0.3 10*3/uL (ref 0.0–0.5)
Eosinophils Relative: 4 %
Immature Granulocytes: 0 %
Lymphocytes Relative: 21 %
Lymphs Abs: 1.5 10*3/uL (ref 0.7–4.0)
Monocytes Absolute: 0.4 10*3/uL (ref 0.1–1.0)
Monocytes Relative: 6 %
Neutro Abs: 5.1 10*3/uL (ref 1.7–7.7)
Neutrophils Relative %: 68 %

## 2021-05-28 LAB — CBC
HCT: 45.3 % (ref 36.0–46.0)
Hemoglobin: 15.2 g/dL — ABNORMAL HIGH (ref 12.0–15.0)
MCH: 30.3 pg (ref 26.0–34.0)
MCHC: 33.6 g/dL (ref 30.0–36.0)
MCV: 90.4 fL (ref 80.0–100.0)
Platelets: 285 10*3/uL (ref 150–400)
RBC: 5.01 MIL/uL (ref 3.87–5.11)
RDW: 13.1 % (ref 11.5–15.5)
WBC: 7.4 10*3/uL (ref 4.0–10.5)
nRBC: 0 % (ref 0.0–0.2)

## 2021-05-28 LAB — I-STAT CHEM 8, ED
BUN: 9 mg/dL (ref 8–23)
Calcium, Ion: 1.16 mmol/L (ref 1.15–1.40)
Chloride: 101 mmol/L (ref 98–111)
Creatinine, Ser: 0.9 mg/dL (ref 0.44–1.00)
Glucose, Bld: 114 mg/dL — ABNORMAL HIGH (ref 70–99)
HCT: 45 % (ref 36.0–46.0)
Hemoglobin: 15.3 g/dL — ABNORMAL HIGH (ref 12.0–15.0)
Potassium: 3 mmol/L — ABNORMAL LOW (ref 3.5–5.1)
Sodium: 140 mmol/L (ref 135–145)
TCO2: 28 mmol/L (ref 22–32)

## 2021-05-28 LAB — URINALYSIS, ROUTINE W REFLEX MICROSCOPIC
Bacteria, UA: NONE SEEN
Bilirubin Urine: NEGATIVE
Glucose, UA: NEGATIVE mg/dL
Ketones, ur: NEGATIVE mg/dL
Nitrite: NEGATIVE
Protein, ur: NEGATIVE mg/dL
Specific Gravity, Urine: <1.005 — ABNORMAL LOW (ref 1.005–1.030)
pH: 7 (ref 5.0–8.0)

## 2021-05-28 LAB — RAPID URINE DRUG SCREEN, HOSP PERFORMED
Amphetamines: NOT DETECTED
Barbiturates: NOT DETECTED
Benzodiazepines: NOT DETECTED
Cocaine: NOT DETECTED
Opiates: NOT DETECTED
Tetrahydrocannabinol: NOT DETECTED

## 2021-05-28 LAB — APTT: aPTT: 34 seconds (ref 24–36)

## 2021-05-28 LAB — ETHANOL: Alcohol, Ethyl (B): <10 mg/dL (ref <10)

## 2021-05-28 LAB — RESP PANEL BY RT-PCR (FLU A&B, COVID) ARPGX2
Influenza A by PCR: NEGATIVE
Influenza B by PCR: NEGATIVE
SARS Coronavirus 2 by RT PCR: NEGATIVE

## 2021-05-28 LAB — PROTIME-INR
INR: 1 (ref 0.8–1.2)
Prothrombin Time: 12.7 seconds (ref 11.4–15.2)

## 2021-05-28 LAB — CBG MONITORING, ED: Glucose-Capillary: 124 mg/dL — ABNORMAL HIGH (ref 70–99)

## 2021-05-28 MED ORDER — MECLIZINE HCL 25 MG PO TABS
25.0000 mg | ORAL_TABLET | Freq: Once | ORAL | Status: AC
  Administered 2021-05-28: 25 mg via ORAL
  Filled 2021-05-28: qty 1

## 2021-05-28 MED ORDER — LORAZEPAM 1 MG PO TABS
1.0000 mg | ORAL_TABLET | Freq: Once | ORAL | Status: AC
  Administered 2021-05-28: 1 mg via ORAL
  Filled 2021-05-28: qty 1

## 2021-05-28 MED ORDER — POTASSIUM CHLORIDE CRYS ER 20 MEQ PO TBCR
40.0000 meq | EXTENDED_RELEASE_TABLET | Freq: Once | ORAL | Status: AC
  Administered 2021-05-28: 40 meq via ORAL
  Filled 2021-05-28: qty 2

## 2021-05-28 NOTE — ED Notes (Signed)
Pt. I-stat Chem 8 results 3.0. EDP, Gray,MD. Made aware.

## 2021-05-28 NOTE — ED Provider Notes (Signed)
[facility]-EMERGENCY DEPT Provider Note   CSN: [id number] Arrival date & time: 05/28/21  1722     History Chief Complaint  Patient presents with   Dizziness   Blurred Vision    Kim Love is a 65 y.o. female.  65 year old female with history of hyperlipidemia, hypertension presents the ER secondary to vision changes, lightheadedness, dizziness.  Onset of symptoms 3 days ago.  She was evaluated urgent care and diagnosed with potential inner ear effusion.  Started on antihistamine and nasal steroid.  This has not significantly improved her symptoms.  She reports symptoms worsen with ambulation, looking at her phone, getting at objects that are close by.  Sensation of lightheadedness, imbalance with her gait.  Feels her vision is blurry, sometimes having double vision.  Feels as though it is in both of her eyes, left worse than right.   No tinnitus.  No numbness or tingling, no neck pain, no nausea or vomiting.  No headaches.  No head injuries.  Denies fevers or chills.  He has not experienced the symptoms in the past.  The history is provided by the patient and a relative. No language interpreter was used.  Dizziness Quality:  Imbalance and lightheadedness Severity:  Mild Onset quality:  Gradual Duration:  3 days Timing:  Constant Progression:  Worsening Chronicity:  New Context: eye movement, head movement and physical activity   Relieved by:  Lying down Associated symptoms: no blood in stool, no chest pain, no diarrhea, no headaches, no hearing loss, no nausea, no palpitations, no shortness of breath, no syncope, no tinnitus, no vision changes and no vomiting       Past Medical History:  Diagnosis Date   Anxiety    Hypercholesterolemia    Hypertension     Patient Active Problem List   Diagnosis Date Noted   Hypokalemia 04/17/2021   Right shoulder pain 02/14/2020   Tick bite of groin 02/14/2020   Sinusitis 10/04/2019   B12 deficiency 01/11/2019    Fatigue 09/30/2018   Dermatitis due to plants, including poison ivy, sumac, and oak 12/18/2016   Sleeping difficulties 03/24/2015   Health care maintenance 11/28/2014   Vitamin D deficiency 05/17/2013   Essential hypertension, benign 03/26/2013   Bradycardia 03/26/2013   Hypercholesterolemia 03/26/2013   Anxiety 03/26/2013    Past Surgical History:  Procedure Laterality Date   TONSILLECTOMY AND ADENOIDECTOMY Bilateral 1980     OB History   No obstetric history on file.     Family History  Problem Relation Age of Onset   Stroke Maternal Grandmother     Social History   Tobacco Use   Smoking status: Former    Types: Cigarettes    Quit date: 09/10/2012    Years since quitting: 8.7   Smokeless tobacco: Current  Substance Use Topics   Alcohol use: No    Alcohol/week: 0.0 standard drinks   Drug use: No    Home Medications Prior to Admission medications   Medication Sig Start Date End Date Taking? Authorizing Provider  amLODipine (NORVASC) 5 MG tablet TAKE 1 TABLET BY MOUTH EVERY DAY 03/27/21   Dale [place], MD  citalopram (CELEXA) 20 MG tablet TAKE 1 TABLET BY MOUTH EVERY DAY 03/17/21   Dale [place], MD  fluticasone (FLONASE) 50 MCG/ACT nasal spray Place 2 sprays into both nostrils daily. 01/25/17   Glori Luis, MD  hydrocortisone-pramoxine [facility]) 2.5-1 % rectal cream Place 1 application rectally 2 (two) times daily as needed for hemorrhoids  or anal itching. 03/17/19   Dale [place], MD  losartan-hydrochlorothiazide (HYZAAR) 100-25 MG tablet TAKE 1 TABLET BY MOUTH EVERY DAY 03/27/21   Dale [place], MD  mupirocin ointment (BACTROBAN) 2 % SMARTSIG:1 Application Topical 2-3 Times Daily 01/01/20   [provider]  vitamin B-12 (CYANOCOBALAMIN) 100 MCG tablet Vitamin B12    [provider]    Allergies    Povidone-iodine, Sulfamethoxazole-trimethoprim, Atenolol, Codeine, Erythromycin, Erythromycin, Olmesartan, and Codeine  Review of Systems    Review of Systems  Constitutional:  Negative for chills and fever.  HENT:  Negative for facial swelling, hearing loss, tinnitus and trouble swallowing.   Eyes:  Positive for visual disturbance. Negative for photophobia.  Respiratory:  Negative for cough and shortness of breath.   Cardiovascular:  Negative for chest pain, palpitations and syncope.  Gastrointestinal:  Negative for abdominal pain, blood in stool, diarrhea, nausea and vomiting.  Endocrine: Negative for polydipsia and polyuria.  Genitourinary:  Negative for difficulty urinating and hematuria.  Musculoskeletal:  Negative for gait problem and joint swelling.  Skin:  Negative for pallor and rash.  Neurological:  Positive for dizziness. Negative for syncope and headaches.  Psychiatric/Behavioral:  Negative for agitation and confusion.    Physical Exam Updated Vital Signs BP 122/78   Pulse 74   Temp 98.7 F (37.1 C)   Resp 18   SpO2 96%   Physical Exam Vitals and nursing note reviewed.  Constitutional:      General: She is not in acute distress.    Appearance: Normal appearance.  HENT:     Head: Normocephalic and atraumatic.     Right Ear: External ear normal. No middle ear effusion. No hemotympanum. Tympanic membrane is not injected.     Left Ear: External ear normal. A middle ear effusion is present. No hemotympanum. Tympanic membrane is not injected.     Ears:     Comments: No mastoid tenderness,     Nose: Nose normal.     Mouth/Throat:     Mouth: Mucous membranes are moist.  Eyes:     General: No visual field deficit or scleral icterus.       Right eye: No discharge.        Left eye: No discharge.     Extraocular Movements: Extraocular movements intact.     Pupils: Pupils are equal, round, and reactive to light.     Comments: Fatigable horizontal nystagmus.   Cardiovascular:     Rate and Rhythm: Normal rate and regular rhythm.     Pulses: Normal pulses.     Heart sounds: Normal heart sounds.  Pulmonary:      Effort: Pulmonary effort is normal. No respiratory distress.     Breath sounds: Normal breath sounds.  Abdominal:     General: Abdomen is flat.     Tenderness: There is no abdominal tenderness.  Musculoskeletal:        General: Normal range of motion.     Cervical back: Full passive range of motion without pain and normal range of motion.     Right lower leg: No edema.     Left lower leg: No edema.  Skin:    General: Skin is warm and dry.     Capillary Refill: Capillary refill takes less than 2 seconds.  Neurological:     Mental Status: She is alert and oriented to person, place, and time.     GCS: GCS eye subscore is 4. GCS verbal subscore is 5. GCS motor  subscore is 6.     Cranial Nerves: Cranial nerves are intact. No facial asymmetry.     Sensory: Sensation is intact.     Motor: Motor function is intact. No tremor or pronator drift.     Coordination: Coordination is intact. Finger-Nose-Finger Test normal.     Gait: Gait is intact.  Psychiatric:        Mood and Affect: Mood normal.        Behavior: Behavior normal.    ED Results / Procedures / Treatments   Labs (all labs ordered are listed, but only abnormal results are displayed) Labs Reviewed  CBC - Abnormal; Notable for the following components:      Result Value   Hemoglobin 15.2 (*)    All other components within normal limits  COMPREHENSIVE METABOLIC PANEL - Abnormal; Notable for the following components:   Potassium 3.2 (*)    Glucose, Bld 118 (*)    All other components within normal limits  URINALYSIS, ROUTINE W REFLEX MICROSCOPIC - Abnormal; Notable for the following components:   Color, Urine YELLOW (*)    APPearance CLEAR (*)    Specific Gravity, Urine <1.005 (*)    Hgb urine dipstick SMALL (*)    Leukocytes,Ua TRACE (*)    All other components within normal limits  CBG MONITORING, ED - Abnormal; Notable for the following components:   Glucose-Capillary 124 (*)    All other components within normal  limits  I-STAT CHEM 8, ED - Abnormal; Notable for the following components:   Potassium 3.0 (*)    Glucose, Bld 114 (*)    Hemoglobin 15.3 (*)    All other components within normal limits  RESP PANEL BY RT-PCR (FLU A&B, COVID) ARPGX2  ETHANOL  PROTIME-INR  APTT  DIFFERENTIAL  RAPID URINE DRUG SCREEN, HOSP PERFORMED    EKG EKG Interpretation  Date/Time:  Sunday May 28 2021 18:24:29 EDT Ventricular Rate:  85 PR Interval:  135 QRS Duration: 97 QT Interval:  393 QTC Calculation: 468 R Axis:   -11 Text Interpretation: Sinus rhythm Abnormal R-wave progression, early transition Borderline repolarization abnormality Confirmed by Tanda Rockers ([id number]) on 05/28/2021 11:10:00 PM  Radiology CT HEAD WO CONTRAST  Result Date: 05/28/2021 CLINICAL DATA:  Dizziness. EXAM: CT HEAD WITHOUT CONTRAST TECHNIQUE: Contiguous axial images were obtained from the base of the skull through the vertex without intravenous contrast. COMPARISON:  None. FINDINGS: Brain: No evidence of acute infarction, hemorrhage, hydrocephalus, extra-axial collection or mass lesion/mass effect. Vascular: No hyperdense vessel or unexpected calcification. Skull: Normal. Negative for fracture or focal lesion. Sinuses/Orbits: No acute finding. Other: None. IMPRESSION: No acute intracranial abnormality. Electronically Signed   By: Darliss Cheney M.D.   On: 05/28/2021 18:23    Procedures Procedures   Medications Ordered in ED Medications  LORazepam (ATIVAN) tablet 1 mg (1 mg Oral Given 05/28/21 2118)  meclizine (ANTIVERT) tablet 25 mg (25 mg Oral Given 05/28/21 2118)  potassium chloride SA (KLOR-CON) CR tablet 40 mEq (40 mEq Oral Given 05/28/21 2241)    ED Course  I have reviewed the triage vital signs and the nursing notes.  Pertinent labs & imaging results that were available during my care of the patient were reviewed by me and considered in my medical decision making (see chart for details).    MDM Rules/Calculators/A&P                            This patient complains  of vision changes, lightheadedness, gait disturbance; this involves an extensive number of treatment options and is a complaint that carries with it a high risk of complications and morbidity.  Vital signs reviewed and are stable.  Serious etiologies considered.   NIHSS 0, symptom onset 3 days ago, not thrombolytics candidate. Neuro exam is non-focal. Gait is steady.  No meningismus. GCS 15, Aox3  Labs reviewed, mild hypokalemia.  Oral replacement ordered.  Mild hemoconcentration on urine.  She is tolerating oral intake.  Labs otherwise unremarkable.  CT head reviewed, nonacute.  Ongoing symptoms.  Will obtain MRI brain without contrast.  Visual disturbance ongoing.  Was notified by nursing that the patient is requesting to leave, she reports that she is hungry wants go to be dinner.  She does not want to wait for MRI.  I did not speak to the patient before she left.  I went to go address the patient's concerns and she had left the emergency department prior to my discussion with her.  She was not given discharge instructions or return precautions.    Final Clinical Impression(s) / ED Diagnoses Final diagnoses:  Dizziness  Blurred vision, bilateral    Rx / DC Orders ED Discharge Orders     None        Sloan Leiter, DO 05/28/21 2320

## 2021-05-28 NOTE — ED Provider Notes (Addendum)
Emergency Medicine Provider Triage Evaluation Note  Kim Love , a 65 y.o. female  was evaluated in triage.  Pt complains of dizziness x2 days.  Was seen at urgent care and diagnosed with inner ear disease and started on Zyrtec and Flonase.  She has been having diplopia and an episode of slurred speech yesterday.  No history of blood clots, strokes, TIA.Marland Kitchen  Review of Systems  Positive: Dizziness, diplopia, slurred speech? Negative: Chest pain, shortness of breath  Physical Exam  BP (!) 163/97   Pulse 100   Temp 98.7 F (37.1 C)   Resp 15   SpO2 100%  Gen:   Awake, no distress   Resp:  Normal effort  MSK:   Moves extremities without difficulty  Other:  Cranial nerves III through XII are grossly intact.  Negative pronator drift.  Sensation to light touch is grossly intact to the upper and lower extremities bilaterally.  Medical Decision Making  Medically screening exam initiated at 5:30 PM.  Appropriate orders placed.  Kim Love was informed that the remainder of the evaluation will be completed by another provider, this initial triage assessment does not replace that evaluation, and the importance of remaining in the ED until their evaluation is complete.  Not a code stroke   Theron Arista, Cordelia Poche 05/28/21 1733    [address] PA-C 05/28/21 1739    Bethann Berkshire, MD 05/29/21 1705

## 2021-05-28 NOTE — ED Notes (Addendum)
Patient called out asking when she was going to MRI. RN attempted 2 times to call MRI, unsuccessful at reaching anyone on the phone. RN updated patient that it can sometimes take a while for MRI to get the patient for the scan. RN notified Dr. Wallace Cullens. Patient said it was taking too long for the scan, that she was hungry, thirsty and wanted to come back another time for it. Patient understands the importance of getting the MRI to determine if she has any obstructions of problems in the brain.

## 2021-05-28 NOTE — ED Notes (Signed)
This Clinical research associate witnessed patient disconnecting herself from monitor and putting on clothes.  Sheria Lang, RN notified

## 2021-05-28 NOTE — ED Notes (Signed)
RN went back to update patients vitals, patient was not in the room. Dr. Wallace Cullens notified.

## 2021-05-28 NOTE — ED Notes (Signed)
Haley,PA at bedside

## 2021-05-28 NOTE — ED Triage Notes (Signed)
Pt arrives via POV. Reports dizziness, double vision, and fatigue for 2 days. Husband believes she was slurring her speech yesterday. Verified with patient that symptoms have been present for greater than 24 hours.

## 2021-05-31 ENCOUNTER — Other Ambulatory Visit: Payer: Self-pay | Admitting: Ophthalmology

## 2021-05-31 ENCOUNTER — Other Ambulatory Visit (HOSPITAL_COMMUNITY): Payer: Self-pay | Admitting: Ophthalmology

## 2021-05-31 DIAGNOSIS — R42 Dizziness and giddiness: Secondary | ICD-10-CM

## 2021-05-31 DIAGNOSIS — H532 Diplopia: Secondary | ICD-10-CM

## 2021-06-01 ENCOUNTER — Observation Stay
Admission: RE | Admit: 2021-06-01 | Discharge: 2021-06-01 | Disposition: A | Discharge status: Home / Self Care | Payer: BC Managed Care – PPO | Source: Ambulatory Visit | Attending: Ophthalmology | Admitting: Ophthalmology

## 2021-06-01 ENCOUNTER — Emergency Department: Payer: BC Managed Care – PPO

## 2021-06-01 ENCOUNTER — Observation Stay: Payer: BC Managed Care – PPO

## 2021-06-01 ENCOUNTER — Observation Stay
Admission: EM | Admit: 2021-06-01 | Discharge: 2021-06-02 | Disposition: A | Discharge status: Home / Self Care | Payer: BC Managed Care – PPO | Attending: Internal Medicine | Admitting: Internal Medicine

## 2021-06-01 ENCOUNTER — Other Ambulatory Visit: Payer: Self-pay

## 2021-06-01 ENCOUNTER — Encounter: Payer: Self-pay | Admitting: Internal Medicine

## 2021-06-01 DIAGNOSIS — H532 Diplopia: Secondary | ICD-10-CM

## 2021-06-01 DIAGNOSIS — Z20822 Contact with and (suspected) exposure to covid-19: Secondary | ICD-10-CM | POA: Diagnosis not present

## 2021-06-01 DIAGNOSIS — I639 Cerebral infarction, unspecified: Secondary | ICD-10-CM | POA: Diagnosis not present

## 2021-06-01 DIAGNOSIS — I10 Essential (primary) hypertension: Secondary | ICD-10-CM | POA: Diagnosis not present

## 2021-06-01 DIAGNOSIS — R42 Dizziness and giddiness: Secondary | ICD-10-CM | POA: Insufficient documentation

## 2021-06-01 DIAGNOSIS — Z79899 Other long term (current) drug therapy: Secondary | ICD-10-CM | POA: Diagnosis not present

## 2021-06-01 DIAGNOSIS — Z87891 Personal history of nicotine dependence: Secondary | ICD-10-CM | POA: Diagnosis not present

## 2021-06-01 DIAGNOSIS — E785 Hyperlipidemia, unspecified: Secondary | ICD-10-CM | POA: Diagnosis not present

## 2021-06-01 DIAGNOSIS — Z720 Tobacco use: Secondary | ICD-10-CM | POA: Insufficient documentation

## 2021-06-01 DIAGNOSIS — E876 Hypokalemia: Secondary | ICD-10-CM | POA: Diagnosis present

## 2021-06-01 DIAGNOSIS — F418 Other specified anxiety disorders: Secondary | ICD-10-CM | POA: Diagnosis present

## 2021-06-01 HISTORY — DX: Tobacco use: Z72.0

## 2021-06-01 LAB — APTT: aPTT: 37 seconds — ABNORMAL HIGH (ref 24–36)

## 2021-06-01 LAB — URINE DRUG SCREEN, QUALITATIVE (ARMC ONLY)
Amphetamines, Ur Screen: NOT DETECTED
Barbiturates, Ur Screen: NOT DETECTED
Benzodiazepine, Ur Scrn: NOT DETECTED
Cannabinoid 50 Ng, Ur ~~LOC~~: NOT DETECTED
Cocaine Metabolite,Ur ~~LOC~~: NOT DETECTED
MDMA (Ecstasy)Ur Screen: NOT DETECTED
Methadone Scn, Ur: NOT DETECTED
Opiate, Ur Screen: NOT DETECTED
Phencyclidine (PCP) Ur S: NOT DETECTED
Tricyclic, Ur Screen: NOT DETECTED

## 2021-06-01 LAB — DIFFERENTIAL
Abs Immature Granulocytes: 0.02 10*3/uL (ref 0.00–0.07)
Basophils Absolute: 0.1 10*3/uL (ref 0.0–0.1)
Basophils Relative: 1 %
Eosinophils Absolute: 0.3 10*3/uL (ref 0.0–0.5)
Eosinophils Relative: 5 %
Immature Granulocytes: 0 %
Lymphocytes Relative: 23 %
Lymphs Abs: 1.4 10*3/uL (ref 0.7–4.0)
Monocytes Absolute: 0.4 10*3/uL (ref 0.1–1.0)
Monocytes Relative: 7 %
Neutro Abs: 3.7 10*3/uL (ref 1.7–7.7)
Neutrophils Relative %: 64 %

## 2021-06-01 LAB — COMPREHENSIVE METABOLIC PANEL
ALT: 9 U/L (ref 0–44)
AST: 17 U/L (ref 15–41)
Albumin: 3.9 g/dL (ref 3.5–5.0)
Alkaline Phosphatase: 84 U/L (ref 38–126)
Anion gap: 11 (ref 5–15)
BUN: 10 mg/dL (ref 8–23)
CO2: 29 mmol/L (ref 22–32)
Calcium: 9.1 mg/dL (ref 8.9–10.3)
Chloride: 95 mmol/L — ABNORMAL LOW (ref 98–111)
Creatinine, Ser: 0.64 mg/dL (ref 0.44–1.00)
GFR, Estimated: >60 mL/min (ref >60)
Glucose, Bld: 104 mg/dL — ABNORMAL HIGH (ref 70–99)
Potassium: 3.2 mmol/L — ABNORMAL LOW (ref 3.5–5.1)
Sodium: 135 mmol/L (ref 135–145)
Total Bilirubin: 0.7 mg/dL (ref 0.3–1.2)
Total Protein: 7.2 g/dL (ref 6.5–8.1)

## 2021-06-01 LAB — RESP PANEL BY RT-PCR (FLU A&B, COVID) ARPGX2
Influenza A by PCR: NEGATIVE
Influenza B by PCR: NEGATIVE
SARS Coronavirus 2 by RT PCR: NEGATIVE

## 2021-06-01 LAB — CBC
HCT: 43.1 % (ref 36.0–46.0)
Hemoglobin: 15.1 g/dL — ABNORMAL HIGH (ref 12.0–15.0)
MCH: 31.3 pg (ref 26.0–34.0)
MCHC: 35 g/dL (ref 30.0–36.0)
MCV: 89.4 fL (ref 80.0–100.0)
Platelets: 255 10*3/uL (ref 150–400)
RBC: 4.82 MIL/uL (ref 3.87–5.11)
RDW: 13 % (ref 11.5–15.5)
WBC: 5.8 10*3/uL (ref 4.0–10.5)
nRBC: 0 % (ref 0.0–0.2)

## 2021-06-01 LAB — PROTIME-INR
INR: 1 (ref 0.8–1.2)
Prothrombin Time: 12.9 seconds (ref 11.4–15.2)

## 2021-06-01 MED ORDER — ACETAMINOPHEN 160 MG/5ML PO SOLN
650.0000 mg | ORAL | Status: DC | PRN
  Filled 2021-06-01: qty 20.3

## 2021-06-01 MED ORDER — VITAMIN B-12 1000 MCG PO TABS: 1000.0000 ug | ORAL_TABLET | Freq: Every day | ORAL | Status: DC

## 2021-06-01 MED ORDER — IOHEXOL 350 MG/ML SOLN
75.0000 mL | Freq: Once | INTRAVENOUS | Status: AC | PRN
  Administered 2021-06-01: 75 mL via INTRAVENOUS
  Filled 2021-06-01: qty 75

## 2021-06-01 MED ORDER — NICOTINE 21 MG/24HR TD PT24
21.0000 mg | MEDICATED_PATCH | Freq: Every day | TRANSDERMAL | Status: DC
  Filled 2021-06-01: qty 1

## 2021-06-01 MED ORDER — ONDANSETRON HCL 4 MG/2ML IJ SOLN: 4.0000 mg | Freq: Three times a day (TID) | INTRAMUSCULAR | Status: DC | PRN

## 2021-06-01 MED ORDER — FLUTICASONE PROPIONATE 50 MCG/ACT NA SUSP
2.0000 | Freq: Every day | NASAL | Status: DC
  Filled 2021-06-01: qty 16

## 2021-06-01 MED ORDER — VITAMIN D3 25 MCG PO TABS
1000.0000 [IU] | ORAL_TABLET | Freq: Every day | ORAL | Status: DC
  Filled 2021-06-01: qty 1

## 2021-06-01 MED ORDER — AMLODIPINE BESYLATE 5 MG PO TABS
5.0000 mg | ORAL_TABLET | Freq: Every day | ORAL | Status: DC
  Administered 2021-06-01: 23:00:00 5 mg via ORAL
  Filled 2021-06-01: qty 1

## 2021-06-01 MED ORDER — SENNOSIDES-DOCUSATE SODIUM 8.6-50 MG PO TABS: 1.0000 | ORAL_TABLET | Freq: Every evening | ORAL | Status: DC | PRN

## 2021-06-01 MED ORDER — ACETAMINOPHEN 650 MG RE SUPP: 650.0000 mg | RECTAL | Status: DC | PRN

## 2021-06-01 MED ORDER — ATORVASTATIN CALCIUM 20 MG PO TABS
40.0000 mg | ORAL_TABLET | Freq: Every day | ORAL | Status: DC
  Administered 2021-06-01 – 2021-06-02 (×2): 40 mg via ORAL
  Filled 2021-06-01 (×2): qty 2

## 2021-06-01 MED ORDER — HYDROCHLOROTHIAZIDE 25 MG PO TABS
25.0000 mg | ORAL_TABLET | Freq: Every day | ORAL | Status: DC
  Administered 2021-06-02: 25 mg via ORAL
  Filled 2021-06-01: qty 1

## 2021-06-01 MED ORDER — LOSARTAN POTASSIUM 50 MG PO TABS
100.0000 mg | ORAL_TABLET | Freq: Every day | ORAL | Status: DC
  Administered 2021-06-02: 100 mg via ORAL
  Filled 2021-06-01: qty 2

## 2021-06-01 MED ORDER — ENOXAPARIN SODIUM 40 MG/0.4ML IJ SOSY
40.0000 mg | PREFILLED_SYRINGE | INTRAMUSCULAR | Status: DC
  Administered 2021-06-01: 40 mg via SUBCUTANEOUS
  Filled 2021-06-01: qty 0.4

## 2021-06-01 MED ORDER — POTASSIUM CHLORIDE CRYS ER 20 MEQ PO TBCR
40.0000 meq | EXTENDED_RELEASE_TABLET | Freq: Once | ORAL | Status: AC
  Administered 2021-06-01: 40 meq via ORAL
  Filled 2021-06-01: qty 2

## 2021-06-01 MED ORDER — CITALOPRAM HYDROBROMIDE 20 MG PO TABS
20.0000 mg | ORAL_TABLET | Freq: Every day | ORAL | Status: DC
  Administered 2021-06-02: 09:00:00 20 mg via ORAL
  Filled 2021-06-01: qty 1

## 2021-06-01 MED ORDER — LORAZEPAM 0.5 MG PO TABS
0.5000 mg | ORAL_TABLET | Freq: Once | ORAL | Status: AC
  Administered 2021-06-01: 0.5 mg via ORAL
  Filled 2021-06-01: qty 1

## 2021-06-01 MED ORDER — LOSARTAN POTASSIUM-HCTZ 100-25 MG PO TABS: 1.0000 | ORAL_TABLET | Freq: Every day | ORAL | Status: DC

## 2021-06-01 MED ORDER — STROKE: EARLY STAGES OF RECOVERY BOOK: Freq: Once | Status: AC

## 2021-06-01 MED ORDER — ACETAMINOPHEN 325 MG PO TABS: 650.0000 mg | ORAL_TABLET | ORAL | Status: DC | PRN

## 2021-06-01 MED ORDER — ASPIRIN 81 MG PO CHEW
324.0000 mg | CHEWABLE_TABLET | Freq: Once | ORAL | Status: AC
  Administered 2021-06-01: 324 mg via ORAL
  Filled 2021-06-01: qty 4

## 2021-06-01 MED ORDER — VITAMIN D3 25 MCG (1000 UNIT) PO TABS
1000.0000 [IU] | ORAL_TABLET | Freq: Every day | ORAL | Status: DC
  Administered 2021-06-02: 09:00:00 1000 [IU] via ORAL
  Filled 2021-06-01: qty 1

## 2021-06-01 MED ORDER — ASPIRIN EC 325 MG PO TBEC
325.0000 mg | DELAYED_RELEASE_TABLET | Freq: Every day | ORAL | Status: DC
  Administered 2021-06-02: 09:00:00 325 mg via ORAL
  Filled 2021-06-01: qty 1

## 2021-06-01 MED ORDER — VITAMIN B-12 1000 MCG PO TABS
1000.0000 ug | ORAL_TABLET | Freq: Every day | ORAL | Status: DC
  Administered 2021-06-02: 09:00:00 1000 ug via ORAL
  Filled 2021-06-01: qty 1

## 2021-06-01 MED ORDER — GADOBUTROL 1 MMOL/ML IV SOLN
6.0000 mL | Freq: Once | INTRAVENOUS | Status: AC | PRN
  Administered 2021-06-01: 6 mL via INTRAVENOUS

## 2021-06-01 MED ORDER — HYDRALAZINE HCL 20 MG/ML IJ SOLN: 5.0000 mg | INTRAMUSCULAR | Status: DC | PRN

## 2021-06-01 NOTE — H&P (Signed)
History and Physical    [name]:[id number] DOB: 1956/05/30 DOA: 06/01/2021  Referring MD/NP/PA:   PCP: Dale Mancelona, MD   Patient coming from:  The patient is coming from home.  At baseline, pt is independent for most of ADL.        Chief Complaint: Double vision, unsteadiness  HPI: Kim Love is a 65 y.o. female with medical history significant of hypertension, hyperlipidemia, depression with anxiety, tobacco abuse who presents with double vision and unsteadiness.  Pt has intermittent double vision and dizziness since September 16.  She also had possible slurred speech initially.  Patient was seen in the ED on 9/18 and had negative CT of head.  Patient did not await for doing MRI of her brain.  She went home, but still has intermittent symptoms.  She went to see ophthalmologist who ordered an MRI of brain, which came back positive for multiple stroke.  Patient denies weakness or numbness in extremities.  No facial droop or slurred speech currently.  Patient has some mild dry cough, no chest pain, shortness breath, fever or chills.  No nausea vomiting, diarrhea or abdominal pain.  No symptoms of UTI.  ED Course: pt was found to have WBC 5.8, pending COVID PCR, INR 1.0, PTT 37, potassium 342, renal function okay, temperature normal, blood pressure 150/84, heart rate 79, RR 18, oxygen saturation 96% on room air.  Chest x-ray negative.  Patient is placed on MedSurg bed for observation, Dr. Otelia Limes of neurology is consulted.  MRI of brain: 1. 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 Associated edema without mass effect. Given involvement of multiple vascular territories, consider a central embolic etiology. 2. Mild-to-moderate chronic microvascular ischemic disease with probable small remote infarct in the left thalamus.  Review of Systems:   General: no fevers, chills, no body weight gain, fatigue HEENT: no blurry vision, hearing changes or  sore throat Respiratory: no dyspnea, has coughing, no wheezing CV: no chest pain, no palpitations GI: no nausea, vomiting, abdominal pain, diarrhea, constipation GU: no dysuria, burning on urination, increased urinary frequency, hematuria  Ext: no leg edema Neuro: no unilateral weakness, numbness, or tingling, no hearing loss. Has double vision and dizziness Skin: no rash, no skin tear. MSK: No muscle spasm, no deformity, no limitation of range of movement in spin Heme: No easy bruising.  Travel history: No recent long distant travel.  Allergy:  Allergies  Allergen Reactions   Povidone-Iodine Swelling   Sulfamethoxazole-Trimethoprim Nausea And Vomiting   Atenolol Other (See Comments)    Bradycardia   Codeine     REACTION: "gallbladder issues"   Erythromycin     REACTION: nausea   Erythromycin     Upset stomach   Olmesartan     Other reaction(s): Other (See Comments) Increased BP.   Codeine     Gallbladder issues    Past Medical History:  Diagnosis Date   Anxiety    Hypercholesterolemia    Hypertension    Tobacco abuse     Past Surgical History:  Procedure Laterality Date   TONSILLECTOMY AND ADENOIDECTOMY Bilateral 1980    Social History:  reports that she quit smoking about 8 years ago. Her smoking use included cigarettes. She uses smokeless tobacco. She reports that she does not drink alcohol and does not use drugs.  Family History:  Family History  Problem Relation Age of Onset   Stroke Maternal Grandmother      Prior to Admission  medications   Medication Sig Start Date End Date Taking? Authorizing Provider  amLODipine (NORVASC) 5 MG tablet TAKE 1 TABLET BY MOUTH EVERY DAY 03/27/21   Dale [place], MD  citalopram (CELEXA) 20 MG tablet TAKE 1 TABLET BY MOUTH EVERY DAY 03/17/21   Dale Millerton, MD  fluticasone (FLONASE) 50 MCG/ACT nasal spray Place 2 sprays into both nostrils daily. 01/25/17   Glori Luis, MD  hydrocortisone-pramoxine [facility])  2.5-1 % rectal cream Place 1 application rectally 2 (two) times daily as needed for hemorrhoids or anal itching. 03/17/19   Dale Parchment, MD  losartan-hydrochlorothiazide (HYZAAR) 100-25 MG tablet TAKE 1 TABLET BY MOUTH EVERY DAY 03/27/21   Dale [place], MD  mupirocin ointment (BACTROBAN) 2 % SMARTSIG:1 Application Topical 2-3 Times Daily 01/01/20   [provider]  vitamin B-12 (CYANOCOBALAMIN) 100 MCG tablet Vitamin B12    [provider]    Physical Exam: Vitals:   06/01/21 1010 06/01/21 1014  BP:  (!) 150/84  Pulse:  79  Resp:  18  Temp:  98.2 F (36.8 C)  TempSrc:  Oral  SpO2:  96%  Weight: 62.6 kg   Height: 5\' 2"  (1.575 m)    General: Not in acute distress HEENT:       Eyes: PERRL, EOMI, no scleral icterus.       ENT: No discharge from the ears and nose, no pharynx injection, no tonsillar enlargement.        Neck: No JVD, no bruit, no mass felt. Heme: No neck lymph node enlargement. Cardiac: S1/S2, RRR, No murmurs, No gallops or rubs. Respiratory: No rales, wheezing, rhonchi or rubs. GI: Soft, nondistended, nontender, no rebound pain, no organomegaly, BS present. GU: No hematuria Ext: No pitting leg edema bilaterally. 1+DP/PT pulse bilaterally. Musculoskeletal: No joint deformities, No joint redness or warmth, no limitation of ROM in spin. Skin: No rashes.  Neuro: Alert, oriented X3, cranial nerves II-XII grossly intact except for double vision, moves all extremities normally. Muscle strength 5/5 in all extremities, sensation to light touch intact.  Psych: Patient is not psychotic, no suicidal or hemocidal ideation.  Labs on Admission: I have personally reviewed following labs and imaging studies  CBC: Recent Labs  Lab 05/28/21 1806 05/28/21 1815 06/01/21 1023  WBC 7.4  --  5.8  NEUTROABS 5.1  --  3.7  HGB 15.2* 15.3* 15.1*  HCT 45.3 45.0 43.1  MCV 90.4  --  89.4  PLT 285  --  255   Basic Metabolic Panel: Recent Labs  Lab 05/28/21 1806  05/28/21 1815 06/01/21 1023  NA 144 140 135  K 3.2* 3.0* 3.2*  CL 108 101 95*  CO2 29  --  29  GLUCOSE 118* 114* 104*  BUN 11 9 10   CREATININE 0.88 0.90 0.64  CALCIUM 9.4  --  9.1   GFR: Estimated Creatinine Clearance: 61.8 mL/min (by C-G formula based on SCr of 0.64 mg/dL). Liver Function Tests: Recent Labs  Lab 05/28/21 1806 06/01/21 1023  AST 15 17  ALT 11 9  ALKPHOS 90 84  BILITOT 0.5 0.7  PROT 7.7 7.2  ALBUMIN 4.0 3.9   No results for input(s): LIPASE, AMYLASE in the last 168 hours. No results for input(s): AMMONIA in the last 168 hours. Coagulation Profile: Recent Labs  Lab 05/28/21 1806 06/01/21 1023  INR 1.0 1.0   Cardiac Enzymes: No results for input(s): CKTOTAL, CKMB, CKMBINDEX, TROPONINI in the last 168 hours. BNP (last 3 results) No results for input(s):  PROBNP in the last 8760 hours. HbA1C: No results for input(s): HGBA1C in the last 72 hours. CBG: Recent Labs  Lab 05/28/21 1729  GLUCAP 124*   Lipid Profile: No results for input(s): CHOL, HDL, LDLCALC, TRIG, CHOLHDL, LDLDIRECT in the last 72 hours. Thyroid Function Tests: No results for input(s): TSH, T4TOTAL, FREET4, T3FREE, THYROIDAB in the last 72 hours. Anemia Panel: No results for input(s): VITAMINB12, FOLATE, FERRITIN, TIBC, IRON, RETICCTPCT in the last 72 hours. Urine analysis:    Component Value Date/Time   COLORURINE YELLOW (A) 05/28/2021 2039   APPEARANCEUR CLEAR (A) 05/28/2021 2039   APPEARANCEUR Clear 07/30/2012 1354   LABSPEC <1.005 (L) 05/28/2021 2039   LABSPEC 1.003 07/30/2012 1354   PHURINE 7.0 05/28/2021 2039   GLUCOSEU NEGATIVE 05/28/2021 2039   GLUCOSEU Negative 07/30/2012 1354   HGBUR SMALL (A) 05/28/2021 2039   BILIRUBINUR NEGATIVE 05/28/2021 2039   BILIRUBINUR neg 06/06/2018 0836   BILIRUBINUR Negative 07/30/2012 1354   KETONESUR NEGATIVE 05/28/2021 2039   PROTEINUR NEGATIVE 05/28/2021 2039   UROBILINOGEN 0.2 06/06/2018 0836   NITRITE NEGATIVE 05/28/2021 2039    LEUKOCYTESUR TRACE (A) 05/28/2021 2039   LEUKOCYTESUR Negative 07/30/2012 1354   Sepsis Labs: @LABRCNTIP (procalcitonin:4,lacticidven:4) ) Recent Results (from the past 240 hour(s))  Resp Panel by RT-PCR (Flu A&B, Covid) Nasopharyngeal Swab     Status: None   Collection Time: 05/28/21  8:33 PM   Specimen: Nasopharyngeal Swab; Nasopharyngeal(NP) swabs in vial transport medium  Result Value Ref Range Status   SARS Coronavirus 2 by RT PCR NEGATIVE NEGATIVE Final    Comment: (NOTE) SARS-CoV-2 target nucleic acids are NOT DETECTED.  The SARS-CoV-2 RNA is generally detectable in upper respiratory specimens during the acute phase of infection. The lowest concentration of SARS-CoV-2 viral copies this assay can detect is 138 copies/mL. A negative result does not preclude SARS-Cov-2 infection and should not be used as the sole basis for treatment or other patient management decisions. A negative result may occur with  improper specimen collection/handling, submission of specimen other than nasopharyngeal swab, presence of viral mutation(s) within the areas targeted by this assay, and inadequate number of viral copies(<138 copies/mL). A negative result must be combined with clinical observations, patient history, and epidemiological information. The expected result is Negative.  Fact Sheet for Patients:  05/30/21  Fact Sheet for Healthcare Providers:  BloggerCourse.com  This test is no t yet approved or cleared by the [place] FDA and  has been authorized for detection and/or diagnosis of SARS-CoV-2 by FDA under an Emergency Use Authorization (EUA). This EUA will remain  in effect (meaning this test can be used) for the duration of the COVID-19 declaration under Section 564(b)(1) of the Act, 21 U.S.C.section 360bbb-3(b)(1), unless the authorization is terminated  or revoked sooner.       Influenza A by PCR NEGATIVE NEGATIVE  Final   Influenza B by PCR NEGATIVE NEGATIVE Final    Comment: (NOTE) The Xpert Xpress SARS-CoV-2/FLU/RSV plus assay is intended as an aid in the diagnosis of influenza from Nasopharyngeal swab specimens and should not be used as a sole basis for treatment. Nasal washings and aspirates are unacceptable for Xpert Xpress SARS-CoV-2/FLU/RSV testing.  Fact Sheet for Patients: [place]  Fact Sheet for Healthcare Providers: BloggerCourse.com  This test is not yet approved or cleared by the [place] FDA and has been authorized for detection and/or diagnosis of SARS-CoV-2 by FDA under an Emergency Use Authorization (EUA). This EUA will remain in effect (meaning this test can  be used) for the duration of the COVID-19 declaration under Section 564(b)(1) of the Act, 21 U.S.C. section 360bbb-3(b)(1), unless the authorization is terminated or revoked.  Performed at [facility], [address] [place], [place] [address]   Resp Panel by RT-PCR (Flu A&B, Covid) Nasopharyngeal Swab     Status: None   Collection Time: 06/01/21  2:16 PM   Specimen: Nasopharyngeal Swab; Nasopharyngeal(NP) swabs in vial transport medium  Result Value Ref Range Status   SARS Coronavirus 2 by RT PCR NEGATIVE NEGATIVE Final    Comment: (NOTE) SARS-CoV-2 target nucleic acids are NOT DETECTED.  The SARS-CoV-2 RNA is generally detectable in upper respiratory specimens during the acute phase of infection. The lowest concentration of SARS-CoV-2 viral copies this assay can detect is 138 copies/mL. A negative result does not preclude SARS-Cov-2 infection and should not be used as the sole basis for treatment or other patient management decisions. A negative result may occur with  improper specimen collection/handling, submission of specimen other than nasopharyngeal swab, presence of viral mutation(s) within the areas targeted by this  assay, and inadequate number of viral copies(<138 copies/mL). A negative result must be combined with clinical observations, patient history, and epidemiological information. The expected result is Negative.  Fact Sheet for Patients:  BloggerCourse.com  Fact Sheet for Healthcare Providers:  [employer]ious[employer]ker.it  This test is no t yet approved or cleared by the [place] FDA and  has been authorized for detection and/or diagnosis of SARS-CoV-2 by FDA under an Emergency Use Authorization (EUA). This EUA will remain  in effect (meaning this test can be used) for the duration of the COVID-19 declaration under Section 564(b)(1) of the Act, 21 U.S.C.section 360bbb-3(b)(1), unless the authorization is terminated  or revoked sooner.       Influenza A by PCR NEGATIVE NEGATIVE Final   Influenza B by PCR NEGATIVE NEGATIVE Final    Comment: (NOTE) The Xpert Xpress SARS-CoV-2/FLU/RSV plus assay is intended as an aid in the diagnosis of influenza from Nasopharyngeal swab specimens and should not be used as a sole basis for treatment. Nasal washings and aspirates are unacceptable for Xpert Xpress SARS-CoV-2/FLU/RSV testing.  Fact Sheet for Patients: BloggerCourse.com  Fact Sheet for Healthcare Providers: [employer]ious[employer]ker.it  This test is not yet approved or cleared by the [place] FDA and has been authorized for detection and/or diagnosis of SARS-CoV-2 by FDA under an Emergency Use Authorization (EUA). This EUA will remain in effect (meaning this test can be used) for the duration of the COVID-19 declaration under Section 564(b)(1) of the Act, 21 U.S.C. section 360bbb-3(b)(1), unless the authorization is terminated or revoked.  Performed at [facility], [address] [place], [place] [address]      Radiological Exams on Admission: MR BRAIN W WO CONTRAST  Result Date:  06/01/2021 CLINICAL DATA:  Diplopia H53.2 (ICD-10-CM) Vertigo R42 (ICD-10-CM) EXAM: MRI HEAD WITHOUT AND WITH CONTRAST TECHNIQUE: Multiplanar, multiecho pulse sequences of the brain and surrounding structures were obtained without and with intravenous contrast. CONTRAST:  79mL GADAVIST GADOBUTROL 1 MMOL/ML IV SOLN COMPARISON:  CT head 05/28/2021. FINDINGS: Brain: Multiple acute infarcts in the right thalamus with additional punctate acute infarcts in the left parietal white matter. Associated edema without mass effect. Probable small remote lacunar infarct in the left thalamus where there is focal T2/FLAIR hyperintensity without restricted diffusion. Additional mild-to-moderate scattered T2 hyperintensities within the white matter, nonspecific but compatible with chronic microvascular ischemic disease. No acute hemorrhage, hydrocephalus, mass lesion, midline shift,  or extra-axial fluid collection. Vascular: Major arterial flow voids are maintained at the skull base. Skull and upper cervical spine: Normal marrow signal. Sinuses/Orbits: Mild paranasal sinus mucosal thickening. Unremarkable orbits. Other: No mastoid effusions. Acute findings discussed with the MRI technologist via telephone who will transfer the patient to the emergency room for management of the above findings. IMPRESSION: 1. 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 Associated edema without mass effect. Given involvement of multiple vascular territories, consider a central embolic etiology. 2. Mild-to-moderate chronic microvascular ischemic disease with probable small remote infarct in the left thalamus. Electronically Signed   By: Feliberto Harts M.D.   On: 06/01/2021 10:16   DG Chest Portable 1 View  Result Date: 06/01/2021 CLINICAL DATA:  cva EXAM: PORTABLE CHEST 1 VIEW COMPARISON:  None. FINDINGS: The cardiomediastinal silhouette is within normal limits. No pleural effusion. No pneumothorax. No mass or  consolidation. No acute osseous abnormality. IMPRESSION: No acute findings in the chest. Electronically Signed   By: Olive Bass M.D.   On: 06/01/2021 15:22     EKG: I have personally reviewed.  Sinus rhythm, QTC 467, no ischemic change  Assessment/Plan Principal Problem:   Stroke [facility]) Active Problems:   Hypokalemia   Depression with anxiety   HLD (hyperlipidemia)   HTN (hypertension)   Tobacco abuse   Stroke [facility]): MRI of the brain showed pattern of embolic stroke.  Dr. Otelia Limes of neurology is consulted  -Placed on MedSurg bed for observation - Check carotid dopplers  - ASA  324 mg daily - start lipitor 40 mg daily - fasting lipid panel and HbA1c  - 2D transthoracic echocardiography  - swallowing screen. If fails, will get SLP - Check UDS   Hypokalemia: Potassium 3.2 -Repleted potassium -Check magnesium level  Depression with anxiety -Continue home medications  HLD (hyperlipidemia) -Started Lipitor  HTN (hypertension) -IV hydralazine as needed -Continue home amlodipine, Hyzaar  Tobacco abuse -Nicotine patch -Did counseling about importance of quitting tobacco         DVT ppx: SQ Lovenox Code Status: Full code Family Communication: Yes, patient's daughter-in-law at bed side Disposition Plan:  Anticipate discharge back to previous environment Consults called: Dr. Otelia Limes of neurology Admission status and Level of care: Med-Surg:    for obs    Status is: Observation  The patient remains OBS appropriate and will d/c before 2 midnights.  Dispo: The patient is from: Home              Anticipated d/c is to: Home              Patient currently is not medically stable to d/c.   Difficult to place patient No            Date of Service 06/01/2021    Lorretta Harp [facility]   If 7PM-7AM, please contact night-coverage [email or web address] 06/01/2021, 3:55 PM

## 2021-06-01 NOTE — ED Notes (Signed)
Teleneuro RN on at this time.

## 2021-06-01 NOTE — ED Notes (Signed)
Report given verbally to next accepting RN on 1C (room 120). No questions or concerns posed to this RN. Transport called for patient departure

## 2021-06-01 NOTE — ED Provider Notes (Signed)
[facility] Emergency Department Provider Note    Event Date/Time   First MD Initiated Contact with Patient 06/01/21 1353     (approximate)  I have reviewed the triage vital signs and the nursing notes.   HISTORY  Chief Complaint Cerebrovascular Accident and Diplopia    HPI Kim Love is a 65 y.o. female with below listed past medical history presents to the ER for evaluation of double vision as well as some unsteadiness since this weekend.  States that symptoms have gotten better but she still still have intermittent episodes of double vision and unsteadiness.  Went to see her eye doctor who ordered outpatient MRI which shows evidence of acute multiple infarcts.  Denies any chest pain she does smoke does have a history of high blood pressure no history of diabetes.  Past Medical History:  Diagnosis Date   Anxiety    Hypercholesterolemia    Hypertension    Tobacco abuse    Family History  Problem Relation Age of Onset   Stroke Maternal Grandmother    Past Surgical History:  Procedure Laterality Date   TONSILLECTOMY AND ADENOIDECTOMY Bilateral 1980   Patient Active Problem List   Diagnosis Date Noted   Stroke (HCC) 06/01/2021   Depression with anxiety 06/01/2021   HLD (hyperlipidemia) 06/01/2021   HTN (hypertension) 06/01/2021   Tobacco abuse 06/01/2021   Hypokalemia 04/17/2021   Right shoulder pain 02/14/2020   Tick bite of groin 02/14/2020   Sinusitis 10/04/2019   B12 deficiency 01/11/2019   Fatigue 09/30/2018   Dermatitis due to plants, including poison ivy, sumac, and oak 12/18/2016   Sleeping difficulties 03/24/2015   Health care maintenance 11/28/2014   Vitamin D deficiency 05/17/2013   Essential hypertension, benign 03/26/2013   Bradycardia 03/26/2013   Hypercholesterolemia 03/26/2013   Anxiety 03/26/2013      Prior to Admission medications   Medication Sig Start Date End Date Taking? Authorizing Provider  amLODipine  (NORVASC) 5 MG tablet TAKE 1 TABLET BY MOUTH EVERY DAY Patient taking differently: Take 5 mg by mouth at bedtime. 03/27/21  Yes Dale [place], MD  cholecalciferol (VITAMIN D3) 25 MCG (1000 UNIT) tablet Take 1,000 Units by mouth daily.   Yes [provider]  citalopram (CELEXA) 20 MG tablet TAKE 1 TABLET BY MOUTH EVERY DAY 03/17/21  Yes Dale Mandan, MD  fluticasone (FLONASE) 50 MCG/ACT nasal spray Place 2 sprays into both nostrils daily. 01/25/17  Yes Glori Luis, MD  losartan-hydrochlorothiazide (HYZAAR) 100-25 MG tablet TAKE 1 TABLET BY MOUTH EVERY DAY 03/27/21  Yes Dale [place], MD  vitamin B-12 (CYANOCOBALAMIN) 1000 MCG tablet Take 1,000 mcg by mouth daily.   Yes [provider]  hydrocortisone-pramoxine (ANALPRAM HC) 2.5-1 % rectal cream Place 1 application rectally 2 (two) times daily as needed for hemorrhoids or anal itching. Patient not taking: Reported on 06/01/2021 03/17/19   Dale [place], MD    Allergies Povidone-iodine, Sulfamethoxazole-trimethoprim, Atenolol, Codeine, Erythromycin, Erythromycin, Olmesartan, and Codeine    Social History Social History   Tobacco Use   Smoking status: Former    Types: Cigarettes    Quit date: 09/10/2012    Years since quitting: 8.7   Smokeless tobacco: Current  Substance Use Topics   Alcohol use: No    Alcohol/week: 0.0 standard drinks   Drug use: No    Review of Systems Patient denies headaches, rhinorrhea, blurry vision, numbness, shortness of breath, chest pain, edema, cough, abdominal pain, nausea, vomiting, diarrhea, dysuria, fevers, rashes or  hallucinations unless otherwise stated above in HPI. ____________________________________________   PHYSICAL EXAM:  VITAL SIGNS: Vitals:   06/01/21 1014  BP: (!) 150/84  Pulse: 79  Resp: 18  Temp: 98.2 F (36.8 C)  SpO2: 96%    Constitutional: Alert and oriented.  Eyes: Conjunctivae are normal.  Head: Atraumatic. Nose: No  congestion/rhinnorhea. Mouth/Throat: Mucous membranes are moist.   Neck: No stridor. Painless ROM.  Cardiovascular: Normal rate, regular rhythm. Grossly normal heart sounds.  Good peripheral circulation. Respiratory: Normal respiratory effort.  No retractions. Lungs CTAB. Gastrointestinal: Soft and nontender. No distention. No abdominal bruits. No CVA tenderness. Genitourinary:  Musculoskeletal: No lower extremity tenderness nor edema.  No joint effusions. Neurologic:  Normal speech and language. No gross focal neurologic deficits are appreciated. No facial droop Skin:  Skin is warm, dry and intact. No rash noted. Psychiatric: Mood and affect are normal. Speech and behavior are normal.  ____________________________________________   LABS (all labs ordered are listed, but only abnormal results are displayed)  Results for orders placed or performed during the hospital encounter of 06/01/21 (from the past 24 hour(s))  Protime-INR     Status: None   Collection Time: 06/01/21 10:23 AM  Result Value Ref Range   Prothrombin Time 12.9 11.4 - 15.2 seconds   INR 1.0 0.8 - 1.2  APTT     Status: Abnormal   Collection Time: 06/01/21 10:23 AM  Result Value Ref Range   aPTT 37 (H) 24 - 36 seconds  CBC     Status: Abnormal   Collection Time: 06/01/21 10:23 AM  Result Value Ref Range   WBC 5.8 4.0 - 10.5 K/uL   RBC 4.82 3.87 - 5.11 MIL/uL   Hemoglobin 15.1 (H) 12.0 - 15.0 g/dL   HCT 08.6 57.8 - 46.9 %   MCV 89.4 80.0 - 100.0 fL   MCH 31.3 26.0 - 34.0 pg   MCHC 35.0 30.0 - 36.0 g/dL   RDW 62.9 52.8 - 41.3 %   Platelets 255 150 - 400 K/uL   nRBC 0.0 0.0 - 0.2 %  Differential     Status: None   Collection Time: 06/01/21 10:23 AM  Result Value Ref Range   Neutrophils Relative % 64 %   Neutro Abs 3.7 1.7 - 7.7 K/uL   Lymphocytes Relative 23 %   Lymphs Abs 1.4 0.7 - 4.0 K/uL   Monocytes Relative 7 %   Monocytes Absolute 0.4 0.1 - 1.0 K/uL   Eosinophils Relative 5 %   Eosinophils Absolute  0.3 0.0 - 0.5 K/uL   Basophils Relative 1 %   Basophils Absolute 0.1 0.0 - 0.1 K/uL   Immature Granulocytes 0 %   Abs Immature Granulocytes 0.02 0.00 - 0.07 K/uL  Comprehensive metabolic panel     Status: Abnormal   Collection Time: 06/01/21 10:23 AM  Result Value Ref Range   Sodium 135 135 - 145 mmol/L   Potassium 3.2 (L) 3.5 - 5.1 mmol/L   Chloride 95 (L) 98 - 111 mmol/L   CO2 29 22 - 32 mmol/L   Glucose, Bld 104 (H) 70 - 99 mg/dL   BUN 10 8 - 23 mg/dL   Creatinine, Ser 2.44 0.44 - 1.00 mg/dL   Calcium 9.1 8.9 - 01.0 mg/dL   Total Protein 7.2 6.5 - 8.1 g/dL   Albumin 3.9 3.5 - 5.0 g/dL   AST 17 15 - 41 U/L   ALT 9 0 - 44 U/L   Alkaline Phosphatase 84 38 -  126 U/L   Total Bilirubin 0.7 0.3 - 1.2 mg/dL   GFR, Estimated >83 >15 mL/min   Anion gap 11 5 - 15  Resp Panel by RT-PCR (Flu A&B, Covid) Nasopharyngeal Swab     Status: None   Collection Time: 06/01/21  2:16 PM   Specimen: Nasopharyngeal Swab; Nasopharyngeal(NP) swabs in vial transport medium  Result Value Ref Range   SARS Coronavirus 2 by RT PCR NEGATIVE NEGATIVE   Influenza A by PCR NEGATIVE NEGATIVE   Influenza B by PCR NEGATIVE NEGATIVE   ____________________________________________  EKG My review and personal interpretation at Time: 10:11   Indication: cva  Rate: 80  Rhythm: sinus Axis: normal Other: normal intervals, no stemi, nonspecific st abn ____________________________________________  RADIOLOGY  I personally reviewed all radiographic images ordered to evaluate for the above acute complaints and reviewed radiology reports and findings.  These findings were personally discussed with the patient.  Please see medical record for radiology report.  ____________________________________________   PROCEDURES  Procedure(s) performed:  Procedures    Critical Care performed: no ____________________________________________   INITIAL IMPRESSION / ASSESSMENT AND PLAN / ED COURSE  Pertinent labs & imaging  results that were available during my care of the patient were reviewed by me and considered in my medical decision making (see chart for details).   DDX: cva, tia, hypoglycemia, dehydration, electrolyte abnormality, dissection, sepsis   SYMPHANI ECKSTROM is a 65 y.o. who presents to the ED with presentation symptoms as described above consistent with CVA.  Patient and outside of the window as symptoms started over the weekend.  Given positive MRI findings with risk factors including age, smoking history and hypertension will discuss with hospitalist for admission.  The patient will be placed on continuous pulse oximetry and telemetry for monitoring.  Laboratory evaluation will be sent to evaluate for the above complaints.  Have discussed with the patient and available family all diagnostics and treatments performed thus far and all questions were answered to the best of my ability. The patient demonstrates understanding and agreement with plan.       The patient was evaluated in Emergency Department today for the symptoms described in the history of present illness. He/she was evaluated in the context of the global COVID-19 pandemic, which necessitated consideration that the patient might be at risk for infection with the SARS-CoV-2 virus that causes COVID-19. Institutional protocols and algorithms that pertain to the evaluation of patients at risk for COVID-19 are in a state of rapid change based on information released by regulatory bodies including the CDC and federal and state organizations. These policies and algorithms were followed during the patient's care in the ED.  As part of my medical decision making, I reviewed the following data within the electronic MEDICAL RECORD NUMBER Nursing notes reviewed and incorporated, Labs reviewed, notes from prior ED visits and [place] Controlled Substance Database   ____________________________________________   FINAL CLINICAL IMPRESSION(S) / ED DIAGNOSES  Final  diagnoses:  Cerebrovascular accident (CVA), unspecified mechanism (HCC)      NEW MEDICATIONS STARTED DURING THIS VISIT:  New Prescriptions   No medications on file     Note:  This document was prepared using Dragon voice recognition software and may include unintentional dictation errors.    Willy Eddy, MD 06/01/21 [phone]

## 2021-06-01 NOTE — ED Triage Notes (Signed)
Pt was brought to the ED via wheelchair from outpatient MRI positive for acute stroke, pt states she started having some double vision suddenly last Wednesday with dizziness and her opthalmology sent her for the  MRI today

## 2021-06-01 NOTE — Consult Note (Signed)
NEURO HOSPITALIST CONSULT NOTE   Requestig physician: Dr. Clyde Lundborg  Reason for Consult: Subacute strokes seen on MRI  History obtained from:   Patient, Daughter and Chart     HPI:                                                                                                                                          Kim Love is an 65 y.o. female with a PMHx of HTN, hypercholesterolemia, smoking, depression and anxiety who presents from outpatient MRI after the scan came back positive for acute strokes. The patient was LKN last Wednesday when she had sudden onset of double vision and dizziness described as a wobbly sensation rather than vertiginous. She went to see her Ophthalmologist who then scheduled her for an MRI, which was performed today. Currently, the patient denies any visual complaints. Her initial NIHSS in the ED was 0. She has passed her swallow screen.   She denies any associated confusion, limb weakness, dysphasia, dysarthria, facial droop or limb numbness. No SOB or CP. Does endorse a sensation of visual blurring with the diplopia. She described the binocular double vision as diagonal, relieved by closing either eye.   Past Medical History:  Diagnosis Date   Anxiety    Hypercholesterolemia    Hypertension     Past Surgical History:  Procedure Laterality Date   TONSILLECTOMY AND ADENOIDECTOMY Bilateral 1980    Family History  Problem Relation Age of Onset   Stroke Maternal Grandmother              Social History:  reports that she quit smoking about 8 years ago. Her smoking use included cigarettes. She uses smokeless tobacco. She reports that she does not drink alcohol and does not use drugs.  Allergies  Allergen Reactions   Povidone-Iodine Swelling   Sulfamethoxazole-Trimethoprim Nausea And Vomiting   Atenolol Other (See Comments)    Bradycardia   Codeine     REACTION: "gallbladder issues"   Erythromycin     REACTION: nausea   Erythromycin      Upset stomach   Olmesartan     Other reaction(s): Other (See Comments) Increased BP.   Codeine     Gallbladder issues    MEDICATIONS:  No current facility-administered medications on file prior to encounter.   Current Outpatient Medications on File Prior to Encounter  Medication Sig Dispense Refill   amLODipine (NORVASC) 5 MG tablet TAKE 1 TABLET BY MOUTH EVERY DAY (Patient taking differently: Take 5 mg by mouth at bedtime.) 90 tablet 3   cholecalciferol (VITAMIN D3) 25 MCG (1000 UNIT) tablet Take 1,000 Units by mouth daily.     citalopram (CELEXA) 20 MG tablet TAKE 1 TABLET BY MOUTH EVERY DAY 90 tablet 1   fluticasone (FLONASE) 50 MCG/ACT nasal spray Place 2 sprays into both nostrils daily. 16 g 6   losartan-hydrochlorothiazide (HYZAAR) 100-25 MG tablet TAKE 1 TABLET BY MOUTH EVERY DAY 90 tablet 3   vitamin B-12 (CYANOCOBALAMIN) 1000 MCG tablet Take 1,000 mcg by mouth daily.     hydrocortisone-pramoxine (ANALPRAM HC) 2.5-1 % rectal cream Place 1 application rectally 2 (two) times daily as needed for hemorrhoids or anal itching. (Patient not taking: Reported on 06/01/2021) 30 g 0    ROS:                                                                                                                                       No headache. Other symptoms as per HPI with comprehensive ROS otherwise negative.    Blood pressure (!) 150/84, pulse 79, temperature 98.2 F (36.8 C), temperature source Oral, resp. rate 18, height 5\' 2"  (1.575 m), weight 62.6 kg, SpO2 96 %.   General Examination:                                                                                                       Physical Exam  HEENT-  [place]/AT   Lungs- Respirations unlabored Extremities- No edema  Neurological Examination Mental Status: Awake, alert and oriented. Thought content appropriate.   Speech fluent without evidence of aphasia.  Able to follow all commands without difficulty. Cranial Nerves: II: Temporal visual fields intact with no extinction to DSS. PERRL  III,IV, VI: EOMI with saccadic pursuits to the right, smooth pursuits to the left. No nystagmus. No ptosis. No diplopia with EOM.  VIII: Hearing intact to voice.  IX,X: No hypophonia XI: Head is midline XII: midline tongue extension Motor: Right : Upper extremity   5/5    Left:     Upper extremity   5/5  Lower extremity   5/5     Lower extremity   5/5 No pronator drift.  Sensory: Temp and light touch intact throughout,  bilaterally. No extinction to DSS.  Deep Tendon Reflexes: Asymmetry is noted: 3+ left brachioradialis and biceps, 1+ right brachioradialis and biceps. 3+ bilateral patellae Negative Hoffman's sign bilaterally Cerebellar: No ataxia with FNF bilaterally  Gait: Stands with own power. Negative Romberg   Lab Results: Basic Metabolic Panel: Recent Labs  Lab 05/28/21 1806 05/28/21 1815 06/01/21 1023  NA 144 140 135  K 3.2* 3.0* 3.2*  CL 108 101 95*  CO2 29  --  29  GLUCOSE 118* 114* 104*  BUN 11 9 10   CREATININE 0.88 0.90 0.64  CALCIUM 9.4  --  9.1    CBC: Recent Labs  Lab 05/28/21 1806 05/28/21 1815 06/01/21 1023  WBC 7.4  --  5.8  NEUTROABS 5.1  --  3.7  HGB 15.2* 15.3* 15.1*  HCT 45.3 45.0 43.1  MCV 90.4  --  89.4  PLT 285  --  255    Cardiac Enzymes: No results for input(s): CKTOTAL, CKMB, CKMBINDEX, TROPONINI in the last 168 hours.  Lipid Panel: No results for input(s): CHOL, TRIG, HDL, CHOLHDL, VLDL, LDLCALC in the last 168 hours.  Imaging: MR BRAIN W WO CONTRAST  Result Date: 06/01/2021 CLINICAL DATA:  Diplopia H53.2 (ICD-10-CM) Vertigo R42 (ICD-10-CM) EXAM: MRI HEAD WITHOUT AND WITH CONTRAST TECHNIQUE: Multiplanar, multiecho pulse sequences of the brain and surrounding structures were obtained without and with intravenous contrast. CONTRAST:  37mL GADAVIST GADOBUTROL 1  MMOL/ML IV SOLN COMPARISON:  CT head 05/28/2021. FINDINGS: Brain: Multiple acute infarcts in the right thalamus with additional punctate acute infarcts in the left parietal white matter. Associated edema without mass effect. Probable small remote lacunar infarct in the left thalamus where there is focal T2/FLAIR hyperintensity without restricted diffusion. Additional mild-to-moderate scattered T2 hyperintensities within the white matter, nonspecific but compatible with chronic microvascular ischemic disease. No acute hemorrhage, hydrocephalus, mass lesion, midline shift, or extra-axial fluid collection. Vascular: Major arterial flow voids are maintained at the skull base. Skull and upper cervical spine: Normal marrow signal. Sinuses/Orbits: Mild paranasal sinus mucosal thickening. Unremarkable orbits. Other: No mastoid effusions. Acute findings discussed with the MRI technologist via telephone who will transfer the patient to the emergency room for management of the above findings. IMPRESSION: 1. 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 Associated edema without mass effect. Given involvement of multiple vascular territories, consider a central embolic etiology. 2. Mild-to-moderate chronic microvascular ischemic disease with probable small remote infarct in the left thalamus. Electronically Signed   By: 05/30/2021 M.D.   On: 06/01/2021 10:16     Assessment: 65 year old female presenting from outpatient MRI after the scan came back positive for subacute strokes. The patient was LKN last Wednesday when she had sudden onset of double vision  and dizziness.  1. Exam reveals asymmetric reflexes and saccadic visual pursuits to the right. Otherwise nonfocal.  2. MRI brain reveals a small subacute right medial thalamic ischemic infarction and a punctate left parietal lobe deep white matter subacute infarction.  3. Cardioembolic stroke is suspected given involvement of two  separate vascular territories.  4. EKG: Normal sinus rhythm, nonspecific ST abnormality  Recommendations: 1. Agree with starting ASA and atorvastatin.  2. BP management. Well outside of the permissive HTN time window.  3. TTE is pending  4. CTA of head and neck (ordered) 5. Cardiac telemetry 6. HgbA1c, fasting lipid panel 7. Risk factor modification - smoking cessation and light exercise program.    Electronically signed: Dr. Sunday  06/01/2021, 2:34 PM

## 2021-06-01 NOTE — ED Notes (Signed)
See first note.   Pt denies any visual issues at this time. Initial stroke scale at this time 0 and pt passed ale swallow screen. Pt is A&Ox4.

## 2021-06-02 ENCOUNTER — Observation Stay (HOSPITAL_BASED_OUTPATIENT_CLINIC_OR_DEPARTMENT_OTHER)
Admit: 2021-06-02 | Discharge: 2021-06-02 | Disposition: A | Discharge status: Home / Self Care | Payer: BC Managed Care – PPO | Attending: Internal Medicine | Admitting: Internal Medicine

## 2021-06-02 ENCOUNTER — Telehealth: Payer: Self-pay | Admitting: Internal Medicine

## 2021-06-02 DIAGNOSIS — I6389 Other cerebral infarction: Secondary | ICD-10-CM

## 2021-06-02 DIAGNOSIS — I639 Cerebral infarction, unspecified: Secondary | ICD-10-CM

## 2021-06-02 DIAGNOSIS — I10 Essential (primary) hypertension: Secondary | ICD-10-CM | POA: Diagnosis not present

## 2021-06-02 DIAGNOSIS — E876 Hypokalemia: Secondary | ICD-10-CM | POA: Diagnosis not present

## 2021-06-02 LAB — MAGNESIUM: Magnesium: 2.2 mg/dL (ref 1.7–2.4)

## 2021-06-02 LAB — LIPID PANEL
Cholesterol: 163 mg/dL (ref 0–200)
HDL: 39 mg/dL — ABNORMAL LOW (ref >40)
LDL Cholesterol: 104 mg/dL — ABNORMAL HIGH (ref 0–99)
Total CHOL/HDL Ratio: 4.2 RATIO
Triglycerides: 101 mg/dL (ref <150)
VLDL: 20 mg/dL (ref 0–40)

## 2021-06-02 LAB — BASIC METABOLIC PANEL
Anion gap: 9 (ref 5–15)
BUN: 8 mg/dL (ref 8–23)
CO2: 26 mmol/L (ref 22–32)
Calcium: 8.6 mg/dL — ABNORMAL LOW (ref 8.9–10.3)
Chloride: 100 mmol/L (ref 98–111)
Creatinine, Ser: 0.72 mg/dL (ref 0.44–1.00)
GFR, Estimated: >60 mL/min (ref >60)
Glucose, Bld: 96 mg/dL (ref 70–99)
Potassium: 3.5 mmol/L (ref 3.5–5.1)
Sodium: 135 mmol/L (ref 135–145)

## 2021-06-02 LAB — ECHOCARDIOGRAM COMPLETE
AR max vel: 2.31 cm2
AV Area VTI: 2.55 cm2
AV Area mean vel: 2.28 cm2
AV Mean grad: 3.5 mmHg
AV Peak grad: 6.3 mmHg
Ao pk vel: 1.25 m/s
Area-P 1/2: 3.17 cm2
Height: 62 in
S' Lateral: 2.6 cm
Weight: 2208 oz

## 2021-06-02 LAB — HEMOGLOBIN A1C
Hgb A1c MFr Bld: 5.7 % — ABNORMAL HIGH (ref 4.8–5.6)
Mean Plasma Glucose: 117 mg/dL

## 2021-06-02 LAB — HIV ANTIBODY (ROUTINE TESTING W REFLEX): HIV Screen 4th Generation wRfx: NONREACTIVE

## 2021-06-02 MED ORDER — ASPIRIN 325 MG PO TBEC: 325.0000 mg | DELAYED_RELEASE_TABLET | Freq: Every day | ORAL | 0 refills | Status: DC

## 2021-06-02 MED ORDER — POTASSIUM CHLORIDE 20 MEQ PO PACK: 40.0000 meq | PACK | Freq: Once | ORAL | Status: DC

## 2021-06-02 MED ORDER — ATORVASTATIN CALCIUM 40 MG PO TABS: 40.0000 mg | ORAL_TABLET | Freq: Every day | ORAL | 0 refills | Status: DC

## 2021-06-02 NOTE — Progress Notes (Signed)
Student reported elevated BP reported to Pablo Ledger, RN

## 2021-06-02 NOTE — Telephone Encounter (Signed)
Olivia Mackie from [facility] is calling to set the patient up for a hospital follow up within one week and the next opening with Dr.Scott is 10/27.Please advise and call the patient to schedule an appointment.

## 2021-06-02 NOTE — Progress Notes (Signed)
SLP Cancellation Note  Patient Details Name: Kim Love MRN: [id number] DOB: 02-25-1956   Cancelled treatment:        Chart reviewed, Pt visited. No speech language or swallowing problems. Speech was clear and appropriate. No ST needs at this time. Please reconsult if further concerns.   Eather Colas 06/02/2021, 12:00 PM

## 2021-06-02 NOTE — Progress Notes (Signed)
*  PRELIMINARY RESULTS* Echocardiogram 2D Echocardiogram has been performed.  Kim Love 06/02/2021, 12:19 PM

## 2021-06-02 NOTE — Progress Notes (Signed)
PT Cancellation Note  Patient Details Name: Kim Love MRN: [id number] DOB: [birth year]   Cancelled Treatment:    Reason Eval/Treat Not Completed: Other (comment). Chart reviewed and discussed with OT. Pt currently demonstrates independence with all mobility. No acute PT needs identified at this time. Will sign off.   Andrzej Scully 06/02/2021, 10:41 AM Elizabeth Palau, PT, DPT [phone]

## 2021-06-02 NOTE — Evaluation (Signed)
Occupational Therapy Evaluation Patient Details Name: Kim Love MRN: [id number] DOB: 05/26/1956 Today's Date: 06/02/2021   History of Present Illness Kim Love is a 65 y.o. female with medical history significant of hypertension, hyperlipidemia, depression with anxiety, tobacco abuse who presents with double vision and unsteadiness. 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   Clinical Impression   Ms Soloway was seen for OT evaluation this date. Prior to hospital admission, pt was Independent in all aspects of ADL/IADL and mobility. Pt lives with her spouse in a 2 story home with 2 STE, bedroom on first floor. Currently pt reporting symptoms have resolved. Pt demonstrates baseline independence to perform ADL and mobility tasks and no strength, sensory, coordination, cognitive, or visual deficits appreciated with assessment. Extensive education provided to patient and spouse re: OT role, d/c recs, work modifications, ECS, extensive stroke education including signs/symptoms and lifestyle risk factors. No skilled acute OT needs identified. Will sign off. Please re-consult if additional OT needs arise.       Recommendations for follow up therapy are one component of a multi-disciplinary discharge planning process, led by the attending physician.  Recommendations may be updated based on patient status, additional functional criteria and insurance authorization.   Follow Up Recommendations  No OT follow up    Equipment Recommendations  None recommended by OT    Recommendations for Other Services       Precautions / Restrictions Precautions Precautions: None Restrictions Weight Bearing Restrictions: No      Mobility Bed Mobility Overal bed mobility: Independent                  Transfers Overall transfer level: Independent                    Balance Overall balance assessment: Independent                                          ADL either performed or assessed with clinical judgement   ADL Overall ADL's : Independent                                       General ADL Comments: completes grooming, UBD, and meal setup no deficits noted      Pertinent Vitals/Pain Pain Assessment: No/denies pain     Hand Dominance Right   Extremity/Trunk Assessment Upper Extremity Assessment Upper Extremity Assessment: Overall WFL for tasks assessed   Lower Extremity Assessment Lower Extremity Assessment: Overall WFL for tasks assessed       Communication Communication Communication: No difficulties   Cognition Arousal/Alertness: Awake/alert Behavior During Therapy: WFL for tasks assessed/performed Overall Cognitive Status: Within Functional Limits for tasks assessed                                     General Comments       Exercises Exercises: Other exercises Other Exercises Other Exercises: Pt educated re: OT role, d/c recs, extensive stroke education including signs/symptoms and lifestyle factors, work modifications Other Exercises: grooming, UBD, in room mobility   Shoulder Instructions      Home Living Family/patient expects to be discharged to:: Private residence Living Arrangements: Spouse/significant other  Available Help at Discharge: Family Type of Home: House Home Access: Stairs to enter [employer] of Steps: 2   Home Layout: Two level;Able to live on main level with bedroom/bathroom                          Prior Functioning/Environment Level of Independence: Independent                 OT Problem List: Decreased safety awareness         OT Goals(Current goals can be found in the care plan section) Acute Rehab OT Goals Patient Stated Goal: to go home today OT Goal Formulation: With patient/family Time For Goal Achievement: 06/16/21 Potential to Achieve Goals: Good   AM-PAC OT "6 Clicks" Daily Activity      Outcome Measure Help from another person eating meals?: None Help from another person taking care of personal grooming?: None Help from another person toileting, which includes using toliet, bedpan, or urinal?: None Help from another person bathing (including washing, rinsing, drying)?: None Help from another person to put on and taking off regular upper body clothing?: None Help from another person to put on and taking off regular lower body clothing?: None 6 Click Score: 24   End of Session    Activity Tolerance: Patient tolerated treatment well Patient left: in bed;with call bell/phone within reach;with nursing/sitter in room;with family/visitor present  OT Visit Diagnosis: Other abnormalities of gait and mobility (R26.89)                Time: 7829-5621 OT Time Calculation (min): 28 min Charges:  OT General Charges $OT Visit: 1 Visit OT Evaluation $OT Eval Moderate Complexity: 1 Mod OT Treatments $Self Care/Home Management : 8-22 mins  Kathie Dike, M.S. OTR/L  06/02/21, 10:56 AM  ascom [phone]

## 2021-06-02 NOTE — Progress Notes (Signed)
Carotid ultrasound reveals 50-69% stenosis in the right internal carotid artery.  CTA of head and neck: 1. Greater than 70% stenosis of the proximal right ICA at the bifurcation, with post stenotic dilatation, largely secondary to noncalcified plaque. 2. 50% to 60% stenosis of the proximal left ICA, secondary to calcified and noncalcified plaque. 3. Moderate to severe narrowing of the supraclinoid left and cavernous right ICAs. 4. Focal narrowing of the distal right V4 artery. 5. No large vessel occlusion.  TTE is pending.   A/R: 65 year old female presenting yesterday from outpatient MRI after the scan came back positive for two subacute strokes in separate vascular territories. The symptomatic stroke was in the right medial thalamus; a second stroke that was unlikely to be symptomatic was punctate and located in the left parietal lobe deep white matter. The patient was LKN last Wednesday when she had sudden onset of double vision  and dizziness.  - Cardioembolic is a relatively likely mechanism for her stroke given involvement of two separate vascular territories. EKG did not show an arrhythmia, and telemetry so far is with NSR. TTE is pending. - Other possible mechanism of her stroke was atheroembolic. This possibility is supported by the significant stenosis of the right ICA both on carotid ultrasound and CTA. She is a potential candidate for right carotid endarterectomy. - Will need Vascular Surgery consult.  - Continue ASA and atorvastatin.   Electronically signed: Dr. Caryl Pina

## 2021-06-02 NOTE — Discharge Summary (Signed)
MRIPhysician Discharge Summary  Patient ID: DANALEE FLATH MRN: [id number] DOB/AGE: [birth year] 64 y.o.  Admit date: 06/01/2021 Discharge date: 06/02/2021  Admission Diagnoses:  Discharge Diagnoses:  Principal Problem:   Stroke [facility]) Active Problems:   Hypokalemia   Depression with anxiety   HLD (hyperlipidemia)   HTN (hypertension)   Tobacco abuse   Discharged Condition: good  Hospital Course:  GLORA HULGAN is a 65 y.o. female with medical history significant of hypertension, hyperlipidemia, depression with anxiety, tobacco abuse who presents with double vision and unsteadiness. Patient is seen by neurology, MRI of the brain showed acute infarcts in the right thalamus and left parietal white matter. Patient is treated with aspirin and Lipitor. Echocardiogram showed normal ejection fraction, no source of thrombus. CT angiogram of the neck and head showed a right ICA more than 70% stenosis.  Patient has been evaluated by vascular surgery, scheduled to have intervention in 2 or 3 weeks. Patient condition so far had improved, currently all her symptom has improved.  She is medically stable to be discharged.    Consults: neurology and vascular surgery  Significant Diagnostic Studies:  Echo: 1. Left ventricular ejection fraction, by estimation, is 60 to 65%. The  left ventricle has normal function. The left ventricle has no regional  wall motion abnormalities. Left ventricular diastolic parameters are  consistent with Grade I diastolic  dysfunction (impaired relaxation).   2. Right ventricular systolic function is normal. The right ventricular  size is normal. Tricuspid regurgitation signal is inadequate for assessing  PA pressure.   3. The mitral valve is normal in structure. No evidence of mitral valve  regurgitation. No evidence of mitral stenosis.   4. The aortic valve is normal in structure. Aortic valve regurgitation is  not visualized. No aortic stenosis is present.   5.  The inferior vena cava is normal in size with greater than 50%  respiratory variability, suggesting right atrial pressure of 3 mmHg.   CTA: 1. Greater than 70% stenosis of the proximal right ICA at the bifurcation, with post stenotic dilatation, largely secondary to noncalcified plaque. 2. 50% to 60% stenosis of the proximal left ICA, secondary to calcified and noncalcified plaque. 3. Moderate to severe narrowing of the supraclinoid left and cavernous right ICAs. 4. Focal narrowing of the distal right V4 artery. 5. No large vessel occlusion.     Electronically Signed   By: Wiliam Ke M.D.   On: 06/01/2021 23:05  MRI: 1. 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 Associated edema without mass effect. Given involvement of multiple vascular territories, consider a central embolic etiology. 2. Mild-to-moderate chronic microvascular ischemic disease with probable small remote infarct in the left thalamus.  Treatments: ASA, lipitor  Discharge Exam: Blood pressure (!) 162/94, pulse 70, temperature 98.4 F (36.9 C), temperature source Oral, resp. rate 18, height 5\' 2"  (1.575 m), weight 62.6 kg, SpO2 96 %. General appearance: alert and cooperative Resp: clear to auscultation bilaterally Cardio: regular rate and rhythm, S1, S2 normal, no murmur, click, rub or gallop GI: soft, non-tender; bowel sounds normal; no masses,  no organomegaly Extremities: extremities normal, atraumatic, no cyanosis or edema  Disposition: Discharge disposition: 01-Home or Self Care       Discharge Instructions     Diet - low sodium heart healthy   Complete by: As directed    Increase activity slowly   Complete by: As directed       Allergies as of 06/02/2021  Reactions   Povidone-iodine Swelling   Sulfamethoxazole-trimethoprim Nausea And Vomiting   Atenolol Other (See Comments)   Bradycardia   Codeine    REACTION: "gallbladder issues"    Erythromycin    REACTION: nausea   Erythromycin    Upset stomach   Olmesartan    Other reaction(s): Other (See Comments) Increased BP.   Codeine    Gallbladder issues        Medication List     TAKE these medications    amLODipine 5 MG tablet Commonly known as: NORVASC TAKE 1 TABLET BY MOUTH EVERY DAY What changed: when to take this   aspirin 325 MG EC tablet Take 1 tablet (325 mg total) by mouth daily. Start taking on: June 03, 2021   atorvastatin 40 MG tablet Commonly known as: LIPITOR Take 1 tablet (40 mg total) by mouth daily. Start taking on: June 03, 2021   cholecalciferol 25 MCG (1000 UNIT) tablet Commonly known as: VITAMIN D3 Take 1,000 Units by mouth daily.   citalopram 20 MG tablet Commonly known as: CELEXA TAKE 1 TABLET BY MOUTH EVERY DAY   fluticasone 50 MCG/ACT nasal spray Commonly known as: FLONASE Place 2 sprays into both nostrils daily.   hydrocortisone-pramoxine 2.5-1 % rectal cream Commonly known as: Analpram HC Place 1 application rectally 2 (two) times daily as needed for hemorrhoids or anal itching.   losartan-hydrochlorothiazide 100-25 MG tablet Commonly known as: HYZAAR TAKE 1 TABLET BY MOUTH EVERY DAY   vitamin B-12 1000 MCG tablet Commonly known as: CYANOCOBALAMIN Take 1,000 mcg by mouth daily.        Follow-up Information     Dew, Marlow Baars, MD Follow up in 1 month(s).   Specialties: Vascular Surgery, Radiology, Interventional Cardiology Why: Consult.  To see Dew.  Carotid stenosis.  No studies needed. Contact information: [address] [place] [address] [phone]         [facility] Follow up in 1 month(s).   Contact information: [address] [place] [address] [phone]        Dale Westervelt, MD Follow up in 1 week(s).   Specialty: Internal Medicine Contact information: [address] [place]  [address] [phone]                 Signed: Marrion Coy 06/02/2021, 1:26 PM

## 2021-06-02 NOTE — Consult Note (Signed)
[facility] Vascular Consult Note  MRN : [id number]  CONCETTINA LETH is a 65 y.o. (11-04-1955) female who presents with chief complaint of  Chief Complaint  Patient presents with   Cerebrovascular Accident   Diplopia   History of Present Illness:  MARYEM SHUFFLER is a 65 year old female with medical history significant of hypertension, hyperlipidemia, depression with anxiety, tobacco abuse who presents with double vision and unsteadiness.   Pt has intermittent double vision and dizziness since September 16.  She also had possible slurred speech initially.  Patient was seen in the ED on 9/18 and had negative CT of head.  Patient did not await for doing MRI of her brain.  She went home, but still has intermittent symptoms.  She went to see ophthalmologist who ordered an MRI of brain, which came back positive for multiple stroke.  Patient denies weakness or numbness in extremities.  No facial droop or slurred speech currently.  Patient has some mild dry cough, no chest pain, shortness breath, fever or chills.  No nausea vomiting, diarrhea or abdominal pain.  No symptoms of UTI.   MRI of brain: 1. 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 Associated edema without mass effect. Given involvement of multiple vascular territories, consider a central embolic etiology. 2. Mild-to-moderate chronic microvascular ischemic disease with probable small remote infarct in the left thalamus.  Carotid Duplex (06/01/21): 50-69% stenosis in the right internal carotid artery. No significant stenosis on the left.  Vascular surgery was consulted by Dr. Chipper Herb for carotid stenosis.  Current Facility-Administered Medications  Medication Dose Route Frequency Provider Last Rate Last Admin   acetaminophen (TYLENOL) tablet 650 mg  650 mg Oral Q4H PRN Lorretta Harp, MD       Or   acetaminophen (TYLENOL) 160 MG/5ML solution 650 mg  650 mg Per Tube Q4H PRN Lorretta Harp,  MD       Or   acetaminophen (TYLENOL) suppository 650 mg  650 mg Rectal Q4H PRN Lorretta Harp, MD       amLODipine (NORVASC) tablet 5 mg  5 mg Oral QHS Lorretta Harp, MD   5 mg at 06/01/21 2317   aspirin EC tablet 325 mg  325 mg Oral Daily Lorretta Harp, MD   325 mg at 06/02/21 4098   atorvastatin (LIPITOR) tablet 40 mg  40 mg Oral Daily Lorretta Harp, MD   40 mg at 06/02/21 1191   cholecalciferol (VITAMIN D) tablet 1,000 Units  1,000 Units Oral Daily Otelia Sergeant, RPH   1,000 Units at 06/02/21 4782   citalopram (CELEXA) tablet 20 mg  20 mg Oral Daily Lorretta Harp, MD   20 mg at 06/02/21 0927   enoxaparin (LOVENOX) injection 40 mg  40 mg Subcutaneous Q24H Lorretta Harp, MD   40 mg at 06/01/21 2317   fluticasone (FLONASE) 50 MCG/ACT nasal spray 2 spray  2 spray Each Nare Daily Lorretta Harp, MD       hydrALAZINE (APRESOLINE) injection 5 mg  5 mg Intravenous Q2H PRN Lorretta Harp, MD       losartan (COZAAR) tablet 100 mg  100 mg Oral Daily Tressie Ellis, RPH   100 mg at 06/02/21 9562   And   hydrochlorothiazide (HYDRODIURIL) tablet 25 mg  25 mg Oral Daily Tressie Ellis, RPH   25 mg at 06/02/21 1308   nicotine (NICODERM CQ - dosed in mg/24 hours) patch 21 mg  21 mg Transdermal  Daily Lorretta Harp, MD       ondansetron [facility]) injection 4 mg  4 mg Intravenous Q8H PRN Lorretta Harp, MD       potassium chloride (KLOR-CON) packet 40 mEq  40 mEq Oral Once Marrion Coy, MD       senna-docusate (Senokot-S) tablet 1 tablet  1 tablet Oral QHS PRN Lorretta Harp, MD       vitamin B-12 (CYANOCOBALAMIN) tablet 1,000 mcg  1,000 mcg Oral Daily Otelia Sergeant, RPH   1,000 mcg at 06/02/21 1610   Past Medical History:  Diagnosis Date   Anxiety    Hypercholesterolemia    Hypertension    Tobacco abuse    Past Surgical History:  Procedure Laterality Date   TONSILLECTOMY AND ADENOIDECTOMY Bilateral 1980   Social History Social History   Tobacco Use   Smoking status: Former    Types: Cigarettes    Quit date: 09/10/2012     Years since quitting: 8.7   Smokeless tobacco: Current  Substance Use Topics   Alcohol use: No    Alcohol/week: 0.0 standard drinks   Drug use: No   Family History Family History  Problem Relation Age of Onset   Stroke Maternal Grandmother   Positive family history for stroke in maternal grandmother.  Denies peripheral artery disease or renal disease.  Allergies  Allergen Reactions   Povidone-Iodine Swelling   Sulfamethoxazole-Trimethoprim Nausea And Vomiting   Atenolol Other (See Comments)    Bradycardia   Codeine     REACTION: "gallbladder issues"   Erythromycin     REACTION: nausea   Erythromycin     Upset stomach   Olmesartan     Other reaction(s): Other (See Comments) Increased BP.   Codeine     Gallbladder issues   REVIEW OF SYSTEMS (Negative unless checked)  Constitutional: [] Weight loss  [] Fever  [] Chills Cardiac: [] Chest pain   [] Chest pressure   [] Palpitations   [] Shortness of breath when laying flat   [] Shortness of breath at rest   [] Shortness of breath with exertion. Vascular:  [] Pain in legs with walking   [] Pain in legs at rest   [] Pain in legs when laying flat   [] Claudication   [] Pain in feet when walking  [] Pain in feet at rest  [] Pain in feet when laying flat   [] History of DVT   [] Phlebitis   [] Swelling in legs   [] Varicose veins   [] Non-healing ulcers Pulmonary:   [] Uses home oxygen   [] Productive cough   [] Hemoptysis   [] Wheeze  [] COPD   [] Asthma Neurologic:  [] Dizziness  [] Blackouts   [] Seizures   [] History of stroke   [x] History of TIA  [] Aphasia   [x] Temporary blindness   [] Dysphagia   [] Weakness or numbness in arms   [] Weakness or numbness in legs Musculoskeletal:  [] Arthritis   [] Joint swelling   [] Joint pain   [] Low back pain Hematologic:  [] Easy bruising  [] Easy bleeding   [] Hypercoagulable state   [] Anemic  [] Hepatitis Gastrointestinal:  [] Blood in stool   [] Vomiting blood  [] Gastroesophageal reflux/heartburn   [] Difficulty  swallowing. Genitourinary:  [] Chronic kidney disease   [] Difficult urination  [] Frequent urination  [] Burning with urination   [] Blood in urine Skin:  [] Rashes   [] Ulcers   [] Wounds Psychological:  [] History of anxiety   []  History of major depression.  Physical Examination  Vitals:   06/02/21 0415 06/02/21 0604 06/02/21 0731 06/02/21 1116  BP: (!) 145/85 (!) 147/84 129/73 (!) 162/94  Pulse: 80 67 74  70  Resp: 18 14 16 18   Temp: 98.6 F (37 C)  (!) 97.5 F (36.4 C) 98.4 F (36.9 C)  TempSrc:   Oral Oral  SpO2: 100%  96% 96%  Weight:      Height:       Body mass index is 25.24 kg/m. Gen:  WD/WN, NAD Head: [place]/AT, No temporalis wasting. Prominent temp pulse not noted. Ear/Nose/Throat: Hearing grossly intact, nares w/o erythema or drainage, oropharynx w/o Erythema/Exudate Eyes: Sclera non-icteric, conjunctiva clear Neck: Trachea midline.  No JVD.  Pulmonary:  Good air movement, respirations not labored, equal bilaterally.  Cardiac: RRR, normal S1, S2. Vascular:  Vessel Right Left  Radial Palpable Palpable  Ulnar Palpable Palpable   Gastrointestinal: soft, non-tender/non-distended. No guarding/reflex.  Musculoskeletal: M/S 5/5 throughout.  Extremities without ischemic changes.  No deformity or atrophy. No edema. Neurologic: Sensation grossly intact in extremities.  Symmetrical.  Speech is fluent. Motor exam as listed above. Psychiatric: Judgment intact, Mood & affect appropriate for pt's clinical situation. Dermatologic: No rashes or ulcers noted.  No cellulitis or open wounds. Lymph : No Cervical, Axillary, or Inguinal lymphadenopathy.  CBC Lab Results  Component Value Date   WBC 5.8 06/01/2021   HGB 15.1 (H) 06/01/2021   HCT 43.1 06/01/2021   MCV 89.4 06/01/2021   PLT 255 06/01/2021   BMET    Component Value Date/Time   NA 135 06/02/2021 0548   NA 139 07/30/2012 1247   K 3.5 06/02/2021 0548   K 3.2 (L) 07/30/2012 1247   CL 100 06/02/2021 0548   CL 105  07/30/2012 1247   CO2 26 06/02/2021 0548   CO2 26 07/30/2012 1247   GLUCOSE 96 06/02/2021 0548   GLUCOSE 106 (H) 07/30/2012 1247   BUN 8 06/02/2021 0548   BUN 14 07/30/2012 1247   CREATININE 0.72 06/02/2021 0548   CREATININE 0.72 07/30/2012 1247   CALCIUM 8.6 (L) 06/02/2021 0548   CALCIUM 8.9 07/30/2012 1247   GFRNONAA >60 06/02/2021 0548   GFRNONAA >60 07/30/2012 1247   GFRAA >60 07/30/2012 1247   Estimated Creatinine Clearance: 61.8 mL/min (by C-G formula based on SCr of 0.72 mg/dL).  COAG Lab Results  Component Value Date   INR 1.0 06/01/2021   INR 1.0 05/28/2021   Radiology CT ANGIO HEAD NECK W WO CM  Result Date: 06/01/2021 CLINICAL DATA:  Stroke/TIA, determine embolic source EXAM: CT ANGIOGRAPHY HEAD AND NECK TECHNIQUE: Multidetector CT imaging of the head and neck was performed using the standard protocol during bolus administration of intravenous contrast. Multiplanar CT image reconstructions and MIPs were obtained to evaluate the vascular anatomy. Carotid stenosis measurements (when applicable) are obtained utilizing NASCET criteria, using the distal internal carotid diameter as the denominator. CONTRAST:  51mL OMNIPAQUE IOHEXOL 350 MG/ML SOLN COMPARISON:  CT head 05/28/2021, MRI head 06/01/2021. FINDINGS: CT HEAD FINDINGS Brain: No evidence of acute infarction, hemorrhage, hydrocephalus, extra-axial collection or mass lesion/mass effect. The infarcts seen on the same-day MRI are not visualized. Vascular: See CTA findings below. Skull: Normal. Negative for fracture or focal lesion. Sinuses: Imaged portions are clear. Orbits: No acute finding. Review of the MIP images confirms the above findings CTA NECK FINDINGS Aortic arch: 4 vessel arch, with the origin of the left vertebral artery coming from the arch. Moderate calcified and noncalcified atherosclerotic disease. Right carotid system: Greater than 70% stenosis of the proximal right ICA at the bifurcation (series 8, image 73),  with post stenotic dilatation, largely secondary to noncalcified plaque. No  evidence of dissection. Left carotid system: 50-60% stenosis of the proximal left ICA, secondary to calcified and noncalcified plaque. No evidence of dissection. Vertebral arteries: The left vertebral artery originates from the aortic arch. Left dominant vertebral system. Focal moderate to severe narrowing of the distal right V4 segment, likely secondary to noncalcified plaque (series 6, image 201). No evidence of dissection or occlusion. Skeleton: Multifocal degenerative changes. No acute osseous abnormality. Other neck: Subcentimeter hypoattenuating lesion in the right thyroid lobe. Possible 1.4 cm lesion in the left thyroid lobe, although evaluation is limited by beam hardening artifact from contrast bolus. Upper chest: No focal pulmonary opacity. Review of the MIP images confirms the above findings CTA HEAD FINDINGS Anterior circulation: Moderate to severe narrowing of the supraclinoid left ICA (series 6, image 216), secondary to calcified and noncalcified plaque. Focal moderate to severe narrowing of the cavernous right ICA (series 6, image 212). A1 segments patent. Normal anterior communicating artery. Anterior cerebral arteries are widely patent to their distal aspects. No M1 stenosis or occlusion. Normal MCA bifurcations. Distal MCA branches perfused and symmetric. Posterior circulation: Left dominant vertebral system. Focal moderate to severe narrowing of the distal right V4 segment, likely secondary to noncalcified plaque (series 6, image 199). The left vertebral artery is patent. Left PICA is visualized. Right AICA is visualized. Basilar patent to its distal aspect. Superior cerebral arteries patent bilaterally. PCAs perfused to their distal aspects without focal stenosis. Venous sinuses: Within the limitations of bolus timing, patent. Anatomic variants: Posterior communicating artery is not visualized. Review of the MIP images  confirms the above findings IMPRESSION: 1. Greater than 70% stenosis of the proximal right ICA at the bifurcation, with post stenotic dilatation, largely secondary to noncalcified plaque. 2. 50% to 60% stenosis of the proximal left ICA, secondary to calcified and noncalcified plaque. 3. Moderate to severe narrowing of the supraclinoid left and cavernous right ICAs. 4. Focal narrowing of the distal right V4 artery. 5. No large vessel occlusion. Electronically Signed   By: Wiliam Ke M.D.   On: 06/01/2021 23:05   CT HEAD WO CONTRAST  Result Date: 05/28/2021 CLINICAL DATA:  Dizziness. EXAM: CT HEAD WITHOUT CONTRAST TECHNIQUE: Contiguous axial images were obtained from the base of the skull through the vertex without intravenous contrast. COMPARISON:  None. FINDINGS: Brain: No evidence of acute infarction, hemorrhage, hydrocephalus, extra-axial collection or mass lesion/mass effect. Vascular: No hyperdense vessel or unexpected calcification. Skull: Normal. Negative for fracture or focal lesion. Sinuses/Orbits: No acute finding. Other: None. IMPRESSION: No acute intracranial abnormality. Electronically Signed   By: Darliss Cheney M.D.   On: 05/28/2021 18:23   MR BRAIN W WO CONTRAST  Result Date: 06/01/2021 CLINICAL DATA:  Diplopia H53.2 (ICD-10-CM) Vertigo R42 (ICD-10-CM) EXAM: MRI HEAD WITHOUT AND WITH CONTRAST TECHNIQUE: Multiplanar, multiecho pulse sequences of the brain and surrounding structures were obtained without and with intravenous contrast. CONTRAST:  41mL GADAVIST GADOBUTROL 1 MMOL/ML IV SOLN COMPARISON:  CT head 05/28/2021. FINDINGS: Brain: Multiple acute infarcts in the right thalamus with additional punctate acute infarcts in the left parietal white matter. Associated edema without mass effect. Probable small remote lacunar infarct in the left thalamus where there is focal T2/FLAIR hyperintensity without restricted diffusion. Additional mild-to-moderate scattered T2 hyperintensities within the  white matter, nonspecific but compatible with chronic microvascular ischemic disease. No acute hemorrhage, hydrocephalus, mass lesion, midline shift, or extra-axial fluid collection. Vascular: Major arterial flow voids are maintained at the skull base. Skull and upper cervical spine: Normal marrow signal. Sinuses/Orbits:  Mild paranasal sinus mucosal thickening. Unremarkable orbits. Other: No mastoid effusions. Acute findings discussed with the MRI technologist via telephone who will transfer the patient to the emergency room for management of the above findings. IMPRESSION: 1. 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 Associated edema without mass effect. Given involvement of multiple vascular territories, consider a central embolic etiology. 2. Mild-to-moderate chronic microvascular ischemic disease with probable small remote infarct in the left thalamus. Electronically Signed   By: Feliberto Harts M.D.   On: 06/01/2021 10:16   US Carotid Bilateral  Result Date: 06/01/2021 CLINICAL DATA:  Stroke-like symptoms EXAM: BILATERAL CAROTID DUPLEX ULTRASOUND TECHNIQUE: Wallace Cullens scale imaging, color Doppler and duplex ultrasound were performed of bilateral carotid and vertebral arteries in the neck. COMPARISON:  None. FINDINGS: Criteria: Quantification of carotid stenosis is based on velocity parameters that correlate the residual internal carotid diameter with NASCET-based stenosis levels, using the diameter of the distal internal carotid lumen as the denominator for stenosis measurement. The following velocity measurements were obtained: RIGHT ICA: 164/30 cm/sec CCA: 61/16 cm/sec SYSTOLIC ICA/CCA RATIO:  2.7 ECA: 83 cm/sec LEFT ICA: 79/23 cm/sec CCA: 72/22 cm/sec SYSTOLIC ICA/CCA RATIO:  1.1 ECA: 77 cm/sec RIGHT CAROTID ARTERY: Examination of preliminary grayscale images show minimal intimal thickening. Mild atherosclerotic plaque is noted at the carotid bifurcation. Examination the  waveforms, velocities and flow velocity ratios demonstrates a 50-69% stenosis. RIGHT VERTEBRAL ARTERY:  Antegrade in nature. LEFT CAROTID ARTERY: Limited grayscale images demonstrate mild intimal thickening with mild atherosclerotic plaque at the carotid bifurcation. Examination the waveforms, velocities and flow velocity ratios however demonstrate no evidence of focal hemodynamically significant stenosis. LEFT VERTEBRAL ARTERY:  Antegrade in nature. IMPRESSION: 50-69% stenosis in the right internal carotid artery. No significant stenosis on the left. Electronically Signed   By: Alcide Clever M.D.   On: 06/01/2021 21:59   DG Chest Portable 1 View  Result Date: 06/01/2021 CLINICAL DATA:  cva EXAM: PORTABLE CHEST 1 VIEW COMPARISON:  None. FINDINGS: The cardiomediastinal silhouette is within normal limits. No pleural effusion. No pneumothorax. No mass or consolidation. No acute osseous abnormality. IMPRESSION: No acute findings in the chest. Electronically Signed   By: Olive Bass M.D.   On: 06/01/2021 15:22    Assessment/Plan AFTAN VINT is a 65 year old female with medical history significant of hypertension, hyperlipidemia, depression with anxiety, tobacco abuse who presents with double vision and unsteadiness.  1.  Carotid Stenosis:  Patient presented to the [facility] emergency department with TIA-like symptoms.  Patient found to have acute CVA on imaging.  Carotid stenosis noted on duplex.  Moderate disease noted to the right.  Minimal to the left.  Patient will need to undergo CTA of the neck however prefers to do this in the outpatient setting.  Patient would like to be discharged home as soon as possible.  Even though the patient has moderate disease to the right in the setting of TIA with recent CVA we will consider moving forward with repair.  In the setting of acute CVA would have to wait 2 to 3 weeks for repair.  In that time we will see the patient back in our clinic and  schedule a CTA neck for possible stent versus open endarterectomy.  The patient and her husband who is at the bedside are in agreement with the plan.  2.  Hyperlipidemia: On aspirin and statin for medical management Encouraged good control as its slows the progression of  atherosclerotic disease   3.  Tobacco abuse: We had a discussion for approximately three minutes regarding the absolute need for smoking cessation due to the deleterious nature of tobacco on the vascular system. We discussed the tobacco use would diminish patency of any intervention, and likely significantly worsen progressio of disease. We discussed multiple agents for quitting including replacement therapy or medications to reduce cravings. The patient voices their understanding of the importance of smoking cessation.   Discussed with Dr. Weldon Inches, PA-C 06/02/2021 12:51 PM  This note was created with Dragon medical transcription system.  Any error is purely unintentional.

## 2021-06-05 ENCOUNTER — Encounter: Payer: Self-pay | Admitting: Neurology

## 2021-06-05 ENCOUNTER — Telehealth: Payer: Self-pay | Admitting: Internal Medicine

## 2021-06-05 NOTE — Telephone Encounter (Signed)
I am ok to work her in for hospital f/u - can work in on Wednesday if needed, but if having increased pain or acute issues, needs to be evaluated to confirm nothing more acute going on.

## 2021-06-05 NOTE — Telephone Encounter (Signed)
Patient informed, Due to the high volume of calls and your symptoms we have to forward your call to our Triage Nurse to expedient your call. Please hold for the transfer.  Patient transferred to [facility] at Access Nurse. Due to having a stroke recently with left ear and jaw pain and would like to know if she can get in sooner.No openings in office or virtual.Patient disconnected call before transfer.Lelani will send to nurse for the patient to receive a call to be triaged.

## 2021-06-05 NOTE — Telephone Encounter (Signed)
Patient was told by Access Nurse to call our office because she needs to be seen within 24 hours.Access Nurse was informed prior to her call we don't have any openings but they told her to contact our office.Please call the patient to see if she can be seen sooner for the message below.

## 2021-06-05 NOTE — Telephone Encounter (Signed)
See other message

## 2021-06-05 NOTE — Telephone Encounter (Signed)
Pat[place] has appointment in two days with Rennie Plowman, FNP. Access nurse instructed patient on home care advise. Patient recently has a stroke on 22sep22. Pain has been there ever since.

## 2021-06-05 NOTE — Telephone Encounter (Signed)
Patient is scheduled with NP this week. Will keep appt as scheduled and f/u with pt after. Work in appt has been filled for Wednesday

## 2021-06-06 ENCOUNTER — Encounter (INDEPENDENT_AMBULATORY_CARE_PROVIDER_SITE_OTHER): Payer: Self-pay | Admitting: Vascular Surgery

## 2021-06-06 ENCOUNTER — Other Ambulatory Visit: Payer: Self-pay

## 2021-06-06 ENCOUNTER — Ambulatory Visit (INDEPENDENT_AMBULATORY_CARE_PROVIDER_SITE_OTHER): Payer: BC Managed Care – PPO | Admitting: Vascular Surgery

## 2021-06-06 VITALS — BP 130/77 | HR 67 | Resp 16 | Wt 135.8 lb

## 2021-06-06 DIAGNOSIS — I6523 Occlusion and stenosis of bilateral carotid arteries: Secondary | ICD-10-CM | POA: Diagnosis not present

## 2021-06-06 DIAGNOSIS — I779 Disorder of arteries and arterioles, unspecified: Secondary | ICD-10-CM | POA: Insufficient documentation

## 2021-06-06 DIAGNOSIS — I10 Essential (primary) hypertension: Secondary | ICD-10-CM | POA: Diagnosis not present

## 2021-06-06 DIAGNOSIS — E785 Hyperlipidemia, unspecified: Secondary | ICD-10-CM | POA: Diagnosis not present

## 2021-06-06 DIAGNOSIS — I639 Cerebral infarction, unspecified: Secondary | ICD-10-CM | POA: Diagnosis not present

## 2021-06-06 DIAGNOSIS — I6529 Occlusion and stenosis of unspecified carotid artery: Secondary | ICD-10-CM | POA: Insufficient documentation

## 2021-06-06 NOTE — Assessment & Plan Note (Signed)
lipid control important in reducing the progression of atherosclerotic disease. Continue statin therapy  

## 2021-06-06 NOTE — Assessment & Plan Note (Signed)
Likely from significant carotid disease.  See plan as below.

## 2021-06-06 NOTE — Assessment & Plan Note (Addendum)
The patient has a relatively high-grade right ICA stenosis and at least a moderate if not high-grade left ICA stenosis.  She has had recent stroke with positive findings on both sides on MRI.  At this point, she would benefit from intervention of the right side clearly, and potentially the left side as well.  These cannot be done concomitantly.  The right side would be addressed first.  We discussed the differences between carotid endarterectomy and carotid stenting.  Her lesion is an optically calcified and does not look particularly tortuous, and her anatomy seems reasonably good for carotid stenting.  She is elected to proceed with that route on the right.  Following right-sided intervention, we will be evaluating her left carotid stenosis and would consider intervention at a later date if it does appear to be greater than 60% going forward as it appears symptomatic as well.  Risks and benefits of the procedure were discussed with the patient in detail and she is agreeable to proceed.  A prescription was given for Plavix today and she should be on this for at least 5 days prior to her intervention.

## 2021-06-06 NOTE — H&P (View-Only) (Signed)
MRN : [id number]  Kim Love is a 65 y.o. (16-Apr-1956) female who presents with chief complaint of  Chief Complaint  Patient presents with   New Patient (Initial Visit)    [facility] consult for carotid stenosis  .  History of Present Illness: Patient returns today in follow up of her carotid disease and recent stroke.  Her symptoms started a week and a half or so ago.  They were visual primarily although there was some lack of coordination presyncopal issues as well.  She did not have any facial droop.  Her speech seems reasonably good without obvious aphasia.  She was seen in the hospital last week and we discussed her findings which I have independently reviewed.  She had an MRI showing some bilateral infarcts.  She had a CT angiogram which I have independently reviewed.  This demonstrated a 70% or greater right ICA stenosis and reported as a 50 to 60% left ICA stenosis although it may be slightly worse than this.  She has gone home on aspirin and a statin agent.  No recurring symptoms.  She is here today to discuss treatment options.  Current Outpatient Medications  Medication Sig Dispense Refill   amLODipine (NORVASC) 5 MG tablet TAKE 1 TABLET BY MOUTH EVERY DAY (Patient taking differently: Take 5 mg by mouth at bedtime.) 90 tablet 3   aspirin EC 325 MG EC tablet Take 1 tablet (325 mg total) by mouth daily. 30 tablet 0   atorvastatin (LIPITOR) 40 MG tablet Take 1 tablet (40 mg total) by mouth daily. 30 tablet 0   cholecalciferol (VITAMIN D3) 25 MCG (1000 UNIT) tablet Take 1,000 Units by mouth daily.     citalopram (CELEXA) 20 MG tablet TAKE 1 TABLET BY MOUTH EVERY DAY 90 tablet 1   fluticasone (FLONASE) 50 MCG/ACT nasal spray Place 2 sprays into both nostrils daily. (Patient taking differently: Place 2 sprays into both nostrils as needed.) 16 g 6   hydrocortisone-pramoxine (ANALPRAM HC) 2.5-1 % rectal cream Place 1 application rectally 2 (two) times daily as needed for hemorrhoids or anal  itching. 30 g 0   losartan-hydrochlorothiazide (HYZAAR) 100-25 MG tablet TAKE 1 TABLET BY MOUTH EVERY DAY 90 tablet 3   vitamin B-12 (CYANOCOBALAMIN) 1000 MCG tablet Take 1,000 mcg by mouth daily.     No current facility-administered medications for this visit.    Past Medical History:  Diagnosis Date   Anxiety    Hypercholesterolemia    Hypertension    Tobacco abuse     Past Surgical History:  Procedure Laterality Date   TONSILLECTOMY AND ADENOIDECTOMY Bilateral 1980     Social History   Tobacco Use   Smoking status: Former    Types: Cigarettes    Quit date: 09/10/2012    Years since quitting: 8.7   Smokeless tobacco: Current  Substance Use Topics   Alcohol use: No    Alcohol/week: 0.0 standard drinks   Drug use: No       Family History  Problem Relation Age of Onset   Stroke Maternal Grandmother    No bleeding disorders, clotting disorders, autoimmune diseases, or aneurysms  Allergies  Allergen Reactions   Povidone-Iodine Swelling   Sulfamethoxazole-Trimethoprim Nausea And Vomiting   Atenolol Other (See Comments)    Bradycardia   Codeine     REACTION: "gallbladder issues"   Erythromycin     REACTION: nausea   Erythromycin     Upset stomach   Olmesartan  Other reaction(s): Other (See Comments) Increased BP.   Sulfa Antibiotics    Codeine     Gallbladder issues     REVIEW OF SYSTEMS (Negative unless checked)  Constitutional: [] Weight loss  [] Fever  [] Chills Cardiac: [] Chest pain   [] Chest pressure   [] Palpitations   [] Shortness of breath when laying flat   [] Shortness of breath at rest   [] Shortness of breath with exertion. 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   [] Dysphagia   [] Weakness or numbness in arms   [] Weakness or numbness in legs Musculoskeletal:  [] Arthritis   [] Joint swelling   [] Joint pain   [] Low back pain Hematologic:  [] Easy bruising  [] Easy bleeding   [] Hypercoagulable state   [] Anemic   Gastrointestinal:  [] Blood in stool   [] Vomiting blood  [] Gastroesophageal reflux/heartburn   [] Abdominal pain Genitourinary:  [] Chronic kidney disease   [] Difficult urination  [] Frequent urination  [] Burning with urination   [] Hematuria Skin:  [] Rashes   [] Ulcers   [] Wounds Psychological:  [x] History of anxiety   []  History of major depression.  Physical Examination  BP 130/77 (BP Location: Right Arm)   Pulse 67   Resp 16   Wt 135 lb 12.8 oz (61.6 kg)   BMI 24.84 kg/m  Gen:  WD/WN, NAD Head: [place]/AT, No temporalis wasting. Ear/Nose/Throat: Hearing grossly intact, nares w/o erythema or drainage Eyes: Conjunctiva clear. Sclera non-icteric Neck: Supple.  Trachea midline.  Bilateral carotid bruits Pulmonary:  Good air movement, no use of accessory muscles.  Cardiac: RRR, no JVD Vascular:  Vessel Right Left  Radial Palpable Palpable               Musculoskeletal: M/S 5/5 throughout.  No deformity or atrophy. No edema. Neurologic: Sensation grossly intact in extremities.  Symmetrical.  Speech is fluent.  Psychiatric: Judgment intact, Mood & affect appropriate for pt's clinical situation. Dermatologic: No rashes or ulcers noted.  No cellulitis or open wounds.      Labs Recent Results (from the past 2160 hour(s))  Comprehensive metabolic panel     Status: Abnormal   Collection Time: 04/11/21  7:49 AM  Result Value Ref Range   Sodium 138 135 - 145 mEq/L   Potassium 3.3 (L) 3.5 - 5.1 mEq/L   Chloride 100 96 - 112 mEq/L   CO2 28 19 - 32 mEq/L    Glucose, Bld 90 70 - 99 mg/dL   BUN 10 6 - 23 mg/dL   Creatinine, Ser 1.61 0.40 - 1.20 mg/dL   Total Bilirubin 0.5 0.2 - 1.2 mg/dL   Alkaline Phosphatase 84 39 - 117 U/L   AST 12 0 - 37 U/L   ALT 8 0 - 35 U/L   Total Protein 7.1 6.0 - 8.3 g/dL   Albumin 4.0 3.5 - 5.2 g/dL   GFR 09.60 >45.40 mL/min    Comment: Calculated using the CKD-EPI Creatinine  Equation (2021)   Calcium 8.8 8.4 - 10.5 mg/dL  Lipid panel     Status: Abnormal   Collection Time: 04/11/21  7:49 AM  Result Value Ref Range   Cholesterol 202 (H) 0 - 200 mg/dL    Comment: ATP III Classification       Desirable:  < 200 mg/dL               Borderline High:  200 - 239 mg/dL          High:  > = 086 mg/dL   Triglycerides 578.4 0.0 - 149.0 mg/dL    Comment: Normal:  <696 mg/dLBorderline High:  150 - 199 mg/dL   HDL 29.52 >84.13 mg/dL   VLDL 24.4 0.0 - 01.0 mg/dL   LDL Cholesterol 272 (H) 0 - 99 mg/dL   Total CHOL/HDL Ratio 5     Comment:                Men          Women1/2 Average Risk     3.4          3.3Average Risk          5.0          4.42X Average Risk          9.6          7.13X Average Risk          15.0          11.0                       NonHDL 162.21     Comment: NOTE:  Non-HDL goal should be 30 mg/dL higher than patient's LDL goal (i.e. LDL goal of < 70 mg/dL, would have non-HDL goal of < 100 mg/dL)  Potassium     Status: None   Collection Time: 04/25/21  9:07 AM  Result Value Ref Range   Potassium 4.0 3.5 - 5.1 mEq/L  CBG monitoring, ED     Status: Abnormal   Collection Time: 05/28/21  5:29 PM  Result Value Ref Range   Glucose-Capillary 124 (H) 70 - 99 mg/dL    Comment: Glucose reference range applies only to samples taken after fasting for at least 8 hours.   Comment 1 Notify RN    Comment 2 Document in Chart   Ethanol     Status: None   Collection Time: 05/28/21  6:06 PM  Result Value Ref Range   Alcohol, Ethyl (B) <10 <10 mg/dL    Comment: (NOTE) Lowest detectable limit for serum alcohol is 10  mg/dL.  For medical purposes only. Performed at [facility], [address] [place], [place] [address]   Protime-INR     Status: None   Collection Time: 05/28/21  6:06 PM  Result Value Ref Range   Prothrombin Time 12.7 11.4 - 15.2 seconds   INR 1.0 0.8 - 1.2    Comment: (NOTE) INR goal varies based on device and disease states. Performed at [facility], [address], [place], [place] [address]   APTT     Status: None   Collection Time: 05/28/21  6:06 PM  Result Value Ref Range   aPTT 34 24 - 36 seconds    Comment: Performed at [facility], [address] [place], [place] [address]  CBC     Status: Abnormal   Collection Time: 05/28/21  6:06 PM  Result Value Ref Range   WBC 7.4 4.0 - 10.5 K/uL   RBC 5.01 3.87 - 5.11 MIL/uL   Hemoglobin 15.2 (H) 12.0 - 15.0 g/dL   HCT 52.8 41.3 - 24.4 %   MCV 90.4 80.0 - 100.0 fL   MCH 30.3 26.0 - 34.0 pg   MCHC 33.6 30.0 - 36.0 g/dL   RDW 01.0 27.2 - 53.6 %   Platelets 285 150 - 400 K/uL   nRBC 0.0 0.0 - 0.2 %    Comment: Performed at [facility], [address], [place], [place] [address]  Differential     Status: None   Collection Time: 05/28/21  6:06 PM  Result Value Ref Range   Neutrophils Relative % 68 %   Neutro Abs 5.1 1.7 - 7.7 K/uL   Lymphocytes Relative 21 %   Lymphs Abs 1.5 0.7 - 4.0 K/uL   Monocytes Relative 6 %   Monocytes Absolute 0.4 0.1 - 1.0 K/uL   Eosinophils Relative 4 %   Eosinophils Absolute 0.3 0.0 - 0.5 K/uL   Basophils Relative 1 %   Basophils Absolute 0.0 0.0 - 0.1 K/uL   Immature Granulocytes 0 %   Abs Immature Granulocytes 0.01 0.00 - 0.07 K/uL    Comment: Performed at [facility], [address] [place], [place] [address]  Comprehensive metabolic panel     Status: Abnormal   Collection Time: 05/28/21  6:06 PM  Result Value Ref Range   Sodium 144 135 - 145 mmol/L   Potassium 3.2 (L) 3.5 - 5.1 mmol/L    Chloride 108 98 - 111 mmol/L   CO2 29 22 - 32 mmol/L   Glucose, Bld 118 (H) 70 - 99 mg/dL    Comment: Glucose reference range applies only to samples taken after fasting for at least 8 hours.   BUN 11 8 - 23 mg/dL   Creatinine, Ser 9.56 0.44 - 1.00 mg/dL   Calcium 9.4 8.9 - 38.7 mg/dL   Total Protein 7.7 6.5 - 8.1 g/dL   Albumin 4.0 3.5 - 5.0 g/dL   AST 15 15 - 41 U/L   ALT 11 0 - 44 U/L   Alkaline Phosphatase 90 38 - 126 U/L   Total Bilirubin 0.5 0.3 - 1.2 mg/dL   GFR, Estimated >56 >43 mL/min    Comment: (NOTE) Calculated using the CKD-EPI Creatinine Equation (2021)    Anion gap 7 5 - 15    Comment: Performed at [facility], [address], [place], [place] [address]  [place] [address], ED     Status: Abnormal   Collection Time: 05/28/21  6:15 PM  Result Value Ref Range   Sodium 140 135 - 145 mmol/L   Potassium 3.0 (L) 3.5 - 5.1 mmol/L   Chloride 101 98 - 111 mmol/L   BUN 9 8 - 23 mg/dL   Creatinine, Ser 8.84 0.44 - 1.00 mg/dL   Glucose, Bld 166 (H) 70 - 99 mg/dL    Comment: Glucose reference range applies only to samples taken after fasting for at least 8 hours.   Calcium, Ion 1.16 1.15 - 1.40 mmol/L   TCO2 28 22 - 32 mmol/L   Hemoglobin 15.3 (H) 12.0 - 15.0 g/dL   HCT 06.3 01.6 - 01.0 %  Resp Panel by RT-PCR (Flu A&B, Covid) Nasopharyngeal Swab     Status: None   Collection Time: 05/28/21  8:33 PM   Specimen: Nasopharyngeal Swab;  Nasopharyngeal(NP) swabs in vial transport medium  Result Value Ref Range   SARS Coronavirus 2 by RT PCR NEGATIVE NEGATIVE    Comment: (NOTE) SARS-CoV-2 target nucleic acids are NOT DETECTED.  The SARS-CoV-2 RNA is generally detectable in upper respiratory specimens during the acute phase of infection. The lowest concentration of SARS-CoV-2 viral copies this assay can detect is 138 copies/mL. A negative result does not preclude SARS-Cov-2 infection and should not be used as the sole basis for treatment or other patient  management decisions. A negative result may occur with  improper specimen collection/handling, submission of specimen other than nasopharyngeal swab, presence of viral mutation(s) within the areas targeted by this assay, and inadequate number of viral copies(<138 copies/mL). A negative result must be combined with clinical observations, patient history, and epidemiological information. The expected result is Negative.  Fact Sheet for Patients:  BloggerCourse.com  Fact Sheet for Healthcare Providers:  SeriousBroker.it  This test is no t yet approved or cleared by the [place] FDA and  has been authorized for detection and/or diagnosis of SARS-CoV-2 by FDA under an Emergency Use Authorization (EUA). This EUA will remain  in effect (meaning this test can be used) for the duration of the COVID-19 declaration under Section 564(b)(1) of the Act, 21 U.S.C.section 360bbb-3(b)(1), unless the authorization is terminated  or revoked sooner.       Influenza A by PCR NEGATIVE NEGATIVE   Influenza B by PCR NEGATIVE NEGATIVE    Comment: (NOTE) The Xpert Xpress SARS-CoV-2/FLU/RSV plus assay is intended as an aid in the diagnosis of influenza from Nasopharyngeal swab specimens and should not be used as a sole basis for treatment. Nasal washings and aspirates are unacceptable for Xpert Xpress SARS-CoV-2/FLU/RSV testing.  Fact Sheet for Patients: BloggerCourse.com  Fact Sheet for Healthcare Providers: SeriousBroker.it  This test is not yet approved or cleared by the [place] FDA and has been authorized for detection and/or diagnosis of SARS-CoV-2 by FDA under an Emergency Use Authorization (EUA). This EUA will remain in effect (meaning this test can be used) for the duration of the COVID-19 declaration under Section 564(b)(1) of the Act, 21 U.S.C. section 360bbb-3(b)(1), unless the  authorization is terminated or revoked.  Performed at [facility], [address], [place], [place] [address]   Urine rapid drug screen (hosp performed)     Status: None   Collection Time: 05/28/21  8:39 PM  Result Value Ref Range   Opiates NONE DETECTED NONE DETECTED   Cocaine NONE DETECTED NONE DETECTED   Benzodiazepines NONE DETECTED NONE DETECTED   Amphetamines NONE DETECTED NONE DETECTED   Tetrahydrocannabinol NONE DETECTED NONE DETECTED   Barbiturates NONE DETECTED NONE DETECTED    Comment: (NOTE) DRUG SCREEN FOR MEDICAL PURPOSES ONLY.  IF CONFIRMATION IS NEEDED FOR ANY PURPOSE, NOTIFY LAB WITHIN 5 DAYS.  LOWEST DETECTABLE LIMITS FOR URINE DRUG SCREEN Drug Class                     Cutoff (ng/mL) Amphetamine and metabolites    1000 Barbiturate and metabolites    200 Benzodiazepine                 200 Tricyclics and metabolites     300 Opiates and metabolites        300 Cocaine and metabolites        300 THC  50 Performed at [facility], [address] [place], [place] [address]   Urinalysis, Routine w reflex microscopic     Status: Abnormal   Collection Time: 05/28/21  8:39 PM  Result Value Ref Range   Color, Urine YELLOW (A) YELLOW   APPearance CLEAR (A) CLEAR   Specific Gravity, Urine <1.005 (L) 1.005 - 1.030   pH 7.0 5.0 - 8.0   Glucose, UA NEGATIVE NEGATIVE mg/dL   Hgb urine dipstick SMALL (A) NEGATIVE   Bilirubin Urine NEGATIVE NEGATIVE   Ketones, ur NEGATIVE NEGATIVE mg/dL   Protein, ur NEGATIVE NEGATIVE mg/dL   Nitrite NEGATIVE NEGATIVE   Leukocytes,Ua TRACE (A) NEGATIVE   RBC / HPF 0-5 0 - 5 RBC/hpf   WBC, UA 0-5 0 - 5 WBC/hpf   Bacteria, UA NONE SEEN NONE SEEN   Squamous Epithelial / LPF 0-5 0 - 5   Mucus PRESENT     Comment: Performed at [facility], [address] [place], [place] [address]  Protime-INR     Status: None   Collection Time: 06/01/21 10:23 AM   Result Value Ref Range   Prothrombin Time 12.9 11.4 - 15.2 seconds   INR 1.0 0.8 - 1.2    Comment: (NOTE) INR goal varies based on device and disease states. Performed at [facility], [address] [place], [place] [address]   APTT     Status: Abnormal   Collection Time: 06/01/21 10:23 AM  Result Value Ref Range   aPTT 37 (H) 24 - 36 seconds    Comment:        IF BASELINE aPTT IS ELEVATED, SUGGEST PATIENT RISK ASSESSMENT BE USED TO DETERMINE APPROPRIATE ANTICOAGULANT THERAPY. Performed at [facility], [address] [place], [place] [address]   CBC     Status: Abnormal   Collection Time: 06/01/21 10:23 AM  Result Value Ref Range   WBC 5.8 4.0 - 10.5 K/uL   RBC 4.82 3.87 - 5.11 MIL/uL   Hemoglobin 15.1 (H) 12.0 - 15.0 g/dL   HCT 29.5 62.1 - 30.8 %   MCV 89.4 80.0 - 100.0 fL   MCH 31.3 26.0 - 34.0 pg   MCHC 35.0 30.0 - 36.0 g/dL   RDW 65.7 84.6 - 96.2 %   Platelets 255 150 - 400 K/uL   nRBC 0.0 0.0 - 0.2 %    Comment: Performed at [facility], [address] [place], [place] [address]  Differential     Status: None   Collection Time: 06/01/21 10:23 AM  Result Value Ref Range   Neutrophils Relative % 64 %   Neutro Abs 3.7 1.7 - 7.7 K/uL   Lymphocytes Relative 23 %   Lymphs Abs 1.4 0.7 - 4.0 K/uL   Monocytes Relative 7 %   Monocytes Absolute 0.4 0.1 - 1.0 K/uL   Eosinophils Relative 5 %   Eosinophils Absolute 0.3 0.0 - 0.5 K/uL   Basophils Relative 1 %   Basophils Absolute 0.1 0.0 - 0.1 K/uL   Immature Granulocytes 0 %   Abs Immature Granulocytes 0.02 0.00 - 0.07 K/uL    Comment: Performed at [facility], [address] [place], [place] [address]  Comprehensive metabolic panel     Status: Abnormal   Collection Time: 06/01/21 10:23 AM  Result Value Ref Range   Sodium 135 135 - 145 mmol/L   Potassium 3.2 (L) 3.5 - 5.1 mmol/L   Chloride 95 (L) 98 - 111  mmol/L   CO2 29 22 - 32 mmol/L   Glucose, Bld 104 (H) 70 - 99  mg/dL    Comment: Glucose reference range applies only to samples taken after fasting for at least 8 hours.   BUN 10 8 - 23 m1.61/dL   Creatinine, Ser 0.64 0.44 - 1.00 mg/dL   Calcium 9.1 8.9 - 09.6 mg/dL   Total Protein 7.2 6.5 - 8.1 g/dL   Albumin 3.9 3.5 - 5.0 g/dL   AST 17 15 - 41 U/L   ALT 9 0 - 44 U/L   Alkaline Phosphatase 84 38 - 126 U/L   Total Bilirubin 0.7 0.3 - 1.2 mg/dL   GFR, Estimated >04 >54 mL/min    Comment: (NOTE) Calculated using the CKD-EPI Creatinine Equation (2021)    Anion gap 11 5 - 15    Comment: Performed at [facility], [address], [place], [place] [address]  Resp Panel by RT-PCR (Flu A&B, Covid) Nasopharyngeal Swab     Status: None   Collection Time: 06/01/21  2:16 PM   Specimen: Nasopharyngeal Swab; Nasopharyngeal(NP) swabs in vial transport medium  Result Value Ref Range   SARS Coronavirus 2 by RT PCR NEGATIVE NEGATIVE    Comment: (NOTE) SARS-CoV-2 target nucleic acids are NOT DETECTED.  The SARS-CoV-2 RNA is generally detectable in upper respiratory specimens during the acute phase of infection. The lowest concentration of SARS-CoV-2 viral copies this assay can detect is 138 copies/mL. A negative result does not preclude SARS-Cov-2 infection and should not be used as the sole basis for treatment or other patient management decisions. A negative result may occur with  improper specimen collection/handling, submission of specimen other than nasopharyngeal swab, presence of viral mutation(s) within the areas targeted by this assay, and inadequate number of viral copies(<138 copies/mL). A negative result must be combined with clinical observations, patient history, and epidemiological information. The expected result is Negative.  Fact Sheet for Patients:  BloggerCourse.com  Fact Sheet for Healthcare Providers:  SeriousBroker.it  This test is no t yet approved or cleared by the [place] FDA and  has been authorized for detection and/or diagnosis of SARS-CoV-2 by FDA under an Emergency Use Authorization (EUA). This EUA will remain  in effect (meaning this test can be used) for the duration of the COVID-19 declaration under Section 564(b)(1) of the Act, 21 U.S.C.section 360bbb-3(b)(1), unless the authorization is terminated  or revoked sooner.       Influenza A by PCR NEGATIVE NEGATIVE   Influenza B by PCR NEGATIVE NEGATIVE    Comment: (NOTE) The Xpert Xpress SARS-CoV-2/FLU/RSV plus assay is intended as an aid in the diagnosis of influenza from Nasopharyngeal swab specimens and should not be used as a sole basis for treatment. Nasal washings and aspirates are unacceptable for Xpert Xpress SARS-CoV-2/FLU/RSV testing.  Fact Sheet for Patients: BloggerCourse.com  Fact Sheet for Healthcare Providers: SeriousBroker.it  This test is not yet approved or cleared by the [place] FDA and has been authorized for detection and/or diagnosis of SARS-CoV-2 by FDA under an Emergency Use Authorization (EUA). This EUA will remain in effect (meaning this test can be used) for the duration of the COVID-19 declaration under Section 564(b)(1) of the Act, 21 U.S.C. section 360bbb-3(b)(1), unless the authorization is terminated or revoked.  Performed at [facility], [address] [place], [place] [address]   Urine Drug Screen, Qualitative [facility] only)     Status: None   Collection Time: 06/01/21  6:47  PM  Result Value Ref Range   Tricyclic, Ur Screen NONE DETECTED NONE DETECTED   Amphetamines, Ur Screen NONE DETECTED NONE DETECTED   MDMA (Ecstasy)Ur Screen NONE DETECTED NONE DETECTED   Cocaine Metabolite,Ur [place] NONE DETECTED NONE DETECTED   Opiate, Ur Screen NONE DETECTED NONE DETECTED   Phencyclidine (PCP) Ur S NONE DETECTED NONE DETECTED   Cannabinoid 50 Ng, Ur [place] NONE DETECTED NONE DETECTED    Barbiturates, Ur Screen NONE DETECTED NONE DETECTED   Benzodiazepine, Ur Scrn NONE DETECTED NONE DETECTED   Methadone Scn, Ur NONE DETECTED NONE DETECTED    Comment: (NOTE) Tricyclics + metabolites, urine    Cutoff 1000 ng/mL Amphetamines + metabolites, urine  Cutoff 1000 ng/mL MDMA (Ecstasy), urine              Cutoff 500 ng/mL Cocaine Metabolite, urine          Cutoff 300 ng/mL Opiate + metabolites, urine        Cutoff 300 ng/mL Phencyclidine (PCP), urine         Cutoff 25 ng/mL Cannabinoid, urine                 Cutoff 50 ng/mL Barbiturates + metabolites, urine  Cutoff 200 ng/mL Benzodiazepine, urine              Cutoff 200 ng/mL Methadone, urine                   Cutoff 300 ng/mL  The urine drug screen provides only a preliminary, unconfirmed analytical test result and should not be used for non-medical purposes. Clinical consideration and professional judgment should be applied to any positive drug screen result due to possible interfering substances. A more specific alternate chemical method must be used in order to obtain a confirmed analytical result. Gas chromatography / mass spectrometry (GC/MS) is the preferred confirm atory method. Performed at [facility], [address], [place], [place] [address]   Magnesium     Status: None   Collection Time: 06/02/21  5:48 AM  Result Value Ref Range   Magnesium 2.2 1.7 - 2.4 mg/dL    Comment: Performed at [facility], [address] [place], [place] [address]  Hemoglobin A1c     Status: Abnormal   Collection Time: 06/02/21  5:48 AM  Result Value Ref Range   Hgb A1c MFr Bld 5.7 (H) 4.8 - 5.6 %    Comment: (NOTE)         Prediabetes: 5.7 - 6.4         Diabetes: >6.4         Glycemic control for adults with diabetes: <7.0    Mean Plasma Glucose 117 mg/dL    Comment: (NOTE) Performed At: [facility] [address] [place], [place] [id number] Jolene Schimke MD [id number]   Lipid panel      Status: Abnormal   Collection Time: 06/02/21  5:48 AM  Result Value Ref Range   Cholesterol 163 0 - 200 mg/dL   Triglycerides 784 <696 mg/dL   HDL 39 (L) >29 mg/dL   Total CHOL/HDL Ratio 4.2 RATIO   VLDL 20 0 - 40 mg/dL   LDL Cholesterol 528 (H) 0 - 99 mg/dL    Comment:        Total Cholesterol/HDL:CHD Risk Coronary Heart Disease Risk Table                     Men   Women  1/2 Average Risk   3.4   3.3  Average Risk       5.0   4.4  2 X Average Risk   9.6   7.1  3 X Average Risk  23.4   11.0        Use the calculated Patient Ratio above and the CHD Risk Table to determine the patient's CHD Risk.        ATP III CLASSIFICATION (LDL):  <100     mg/dL   Optimal  161-096  mg/dL   Near or Above                    Optimal  130-159  mg/dL   Borderline  045-409  mg/dL   High  >811     mg/dL   Very High Performed at [facility], [address] [place], [place] [address]   HIV Antibody (routine testing w rflx)     Status: None   Collection Time: 06/02/21  5:48 AM  Result Value Ref Range   HIV Screen 4th Generation wRfx Non Reactive Non Reactive    Comment: Performed at [facility], [address] [place], [place] [address]  Basic metabolic panel     Status: Abnormal   Collection Time: 06/02/21  5:48 AM  Result Value Ref Range   Sodium 135 135 - 145 mmol/L   Potassium 3.5 3.5 - 5.1 mmol/L   [place] 100 98 - 111 mmol/L   CO2 26 22 - 32 mmol/L   Glucose, Bld 96 70 - 99 mg/dL    Comment: Glucose reference range applies only to samples taken after fasting for at least 8 hours.   BUN 8 8 - 23 mg/dL   Creatinine, Ser 1.30 0.44 - 1.00 mg/dL   Calcium 8.6 (L) 8.9 - 10.3 mg/dL   GFR, Estimated >86 >57 mL/min    Comment: (NOTE) Calculated using the CKD-EPI Creatinine Equation (2021)    Anion gap 9 5 - 15    Comment: Performed at [facility], [address] [place], [place] [address]  ECHOCARDIOGRAM COMPLETE     Status: None   Collection Time: 06/02/21  12:19 PM  Result Value Ref Range   Weight 2,208 oz   Height 62 in   BP 162/94 mmHg   Ao pk vel 1.25 m/s   AV Area VTI 2.55 cm2   AR max vel 2.31 cm2   AV Mean grad 3.5 mmHg   AV Peak grad 6.3 mmHg   S' Lateral 2.60 cm   AV Area mean vel 2.28 cm2   Area-P 1/2 3.17 cm2    Radiology CT ANGIO HEAD NECK W WO CM  Result Date: 06/01/2021 CLINICAL DATA:  Stroke/TIA, determine embolic source EXAM: CT ANGIOGRAPHY HEAD AND NECK TECHNIQUE: Multidetector CT imaging of the head and neck was performed using the standard protocol during bolus administration of intravenous contrast. Multiplanar CT image reconstructions and MIPs were obtained to evaluate the vascular anatomy. Carotid stenosis measurements (when applicable) are obtained utilizing NASCET criteria, using the distal internal carotid diameter as the denominator. CONTRAST:  75mL OMNIPAQUE IOHEXOL 350 MG/ML SOLN COMPARISON:  CT head 05/28/2021, MRI head 06/01/2021. FINDINGS: CT HEAD FINDINGS Brain: No evidence of acute infarction, hemorrhage, hydrocephalus, extra-axial collection or mass lesion/mass effect. The infarcts seen on the same-day MRI are not visualized. Vascular: See CTA findings below. Skull: Normal. Negative for fracture or focal lesion. Sinuses: Imaged portions are clear. Orbits: No acute  finding. Review of the MIP images confirms the above findings CTA NECK FINDINGS Aortic arch: 4 vessel arch, with the origin of the left vertebral artery coming from the arch. Moderate calcified and noncalcified atherosclerotic disease. Right carotid system: Greater than 70% stenosis of the proximal right ICA at the bifurcation (series 8, image 73), with post stenotic dilatation, largely secondary to noncalcified plaque. No evidence of dissection. Left carotid system: 50-60% stenosis of the proximal left ICA, secondary to calcified and noncalcified plaque. No evidence of dissection. Vertebral arteries: The left vertebral artery originates from the aortic  arch. Left dominant vertebral system. Focal moderate to severe narrowing of the distal right V4 segment, likely secondary to noncalcified plaque (series 6, image 201). No evidence of dissection or occlusion. Skeleton: Multifocal degenerative changes. No acute osseous abnormality. Other neck: Subcentimeter hypoattenuating lesion in the right thyroid lobe. Possible 1.4 cm lesion in the left thyroid lobe, although evaluation is limited by beam hardening artifact from contrast bolus. Upper chest: No focal pulmonary opacity. Review of the MIP images confirms the above findings CTA HEAD FINDINGS Anterior circulation: Moderate to severe narrowing of the supraclinoid left ICA (series 6, image 216), secondary to calcified and noncalcified plaque. Focal moderate to severe narrowing of the cavernous right ICA (series 6, image 212). A1 segments patent. Normal anterior communicating artery. Anterior cerebral arteries are widely patent to their distal aspects. No M1 stenosis or occlusion. Normal MCA bifurcations. Distal MCA branches perfused and symmetric. Posterior circulation: Left dominant vertebral system. Focal moderate to severe narrowing of the distal right V4 segment, likely secondary to noncalcified plaque (series 6, image 199). The left vertebral artery is patent. Left PICA is visualized. Right AICA is visualized. Basilar patent to its distal aspect. Superior cerebral arteries patent bilaterally. PCAs perfused to their distal aspects without focal stenosis. Venous sinuses: Within the limitations of bolus timing, patent. Anatomic variants: Posterior communicating artery is not visualized. Review of the MIP images confirms the above findings IMPRESSION: 1. Greater than 70% stenosis of the proximal right ICA at the bifurcation, with post stenotic dilatation, largely secondary to noncalcified plaque. 2. 50% to 60% stenosis of the proximal left ICA, secondary to calcified and noncalcified plaque. 3. Moderate to severe  narrowing of the supraclinoid left and cavernous right ICAs. 4. Focal narrowing of the distal right V4 artery. 5. No large vessel occlusion. Electronically Signed   By: Wiliam Ke M.D.   On: 06/01/2021 23:05   CT HEAD WO CONTRAST  Result Date: 05/28/2021 CLINICAL DATA:  Dizziness. EXAM: CT HEAD WITHOUT CONTRAST TECHNIQUE: Contiguous axial images were obtained from the base of the skull through the vertex without intravenous contrast. COMPARISON:  None. FINDINGS: Brain: No evidence of acute infarction, hemorrhage, hydrocephalus, extra-axial collection or mass lesion/mass effect. Vascular: No hyperdense vessel or unexpected calcification. Skull: Normal. Negative for fracture or focal lesion. Sinuses/Orbits: No acute finding. Other: None. IMPRESSION: No acute intracranial abnormality. Electronically Signed   By: Darliss Cheney M.D.   On: 05/28/2021 18:23   MR BRAIN W WO CONTRAST  Result Date: 06/01/2021 CLINICAL DATA:  Diplopia H53.2 (ICD-10-CM) Vertigo R42 (ICD-10-CM) EXAM: MRI HEAD WITHOUT AND WITH CONTRAST TECHNIQUE: Multiplanar, multiecho pulse sequences of the brain and surrounding structures were obtained without and with intravenous contrast. CONTRAST:  16mL GADAVIST GADOBUTROL 1 MMOL/ML IV SOLN COMPARISON:  CT head 05/28/2021. FINDINGS: Brain: Multiple acute infarcts in the right thalamus with additional punctate acute infarcts in the left parietal white matter. Associated edema without mass effect. Probable small  remote lacunar infarct in the left thalamus where there is focal T2/FLAIR hyperintensity without restricted diffusion. Additional mild-to-moderate scattered T2 hyperintensities within the white matter, nonspecific but compatible with chronic microvascular ischemic disease. No acute hemorrhage, hydrocephalus, mass lesion, midline shift, or extra-axial fluid collection. Vascular: Major arterial flow voids are maintained at the skull base. Skull and upper cervical spine: Normal marrow signal.  Sinuses/Orbits: Mild paranasal sinus mucosal thickening. Unremarkable orbits. Other: No mastoid effusions. Acute findings discussed with the MRI technologist via telephone who will transfer the patient to the emergency room for management of the above findings. IMPRESSION: 1. 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 Associated edema without mass effect. Given involvement of multiple vascular territories, consider a central embolic etiology. 2. Mild-to-moderate chronic microvascular ischemic disease with probable small remote infarct in the left thalamus. Electronically Signed   By: Feliberto Harts M.D.   On: 06/01/2021 10:16   US Carotid Bilateral  Result Date: 06/01/2021 CLINICAL DATA:  Stroke-like symptoms EXAM: BILATERAL CAROTID DUPLEX ULTRASOUND TECHNIQUE: Wallace Cullens scale imaging, color Doppler and duplex ultrasound were performed of bilateral carotid and vertebral arteries in the neck. COMPARISON:  None. FINDINGS: Criteria: Quantification of carotid stenosis is based on velocity parameters that correlate the residual internal carotid diameter with NASCET-based stenosis levels, using the diameter of the distal internal carotid lumen as the denominator for stenosis measurement. The following velocity measurements were obtained: RIGHT ICA: 164/30 cm/sec CCA: 61/16 cm/sec SYSTOLIC ICA/CCA RATIO:  2.7 ECA: 83 cm/sec LEFT ICA: 79/23 cm/sec CCA: 72/22 cm/sec SYSTOLIC ICA/CCA RATIO:  1.1 ECA: 77 cm/sec RIGHT CAROTID ARTERY: Examination of preliminary grayscale images show minimal intimal thickening. Mild atherosclerotic plaque is noted at the carotid bifurcation. Examination the waveforms, velocities and flow velocity ratios demonstrates a 50-69% stenosis. RIGHT VERTEBRAL ARTERY:  Antegrade in nature. LEFT CAROTID ARTERY: Limited grayscale images demonstrate mild intimal thickening with mild atherosclerotic plaque at the carotid bifurcation. Examination the waveforms, velocities  and flow velocity ratios however demonstrate no evidence of focal hemodynamically significant stenosis. LEFT VERTEBRAL ARTERY:  Antegrade in nature. IMPRESSION: 50-69% stenosis in the right internal carotid artery. No significant stenosis on the left. Electronically Signed   By: Alcide Clever M.D.   On: 06/01/2021 21:59   DG Chest Portable 1 View  Result Date: 06/01/2021 CLINICAL DATA:  cva EXAM: PORTABLE CHEST 1 VIEW COMPARISON:  None. FINDINGS: The cardiomediastinal silhouette is within normal limits. No pleural effusion. No pneumothorax. No mass or consolidation. No acute osseous abnormality. IMPRESSION: No acute findings in the chest. Electronically Signed   By: Olive Bass M.D.   On: 06/01/2021 15:22   ECHOCARDIOGRAM COMPLETE  Result Date: 06/02/2021    ECHOCARDIOGRAM REPORT   Patient Name:   WESTON FULCO Korff Date of Exam: 06/02/2021 Medical Rec #:  [id number]   Height:       62.0 in Accession #:    [id number]  Weight:       138.0 lb Date of Birth:  1956/01/29   BSA:          1.633 m Patient Age:    65 years    BP:           162/94 mmHg Patient Gender: F           HR:           70 bpm. Exam Location:  [facility] Procedure: 2D Echo, Cardiac Doppler and Color Doppler Indications:     Stroke I63.9  History:  Patient has no prior history of Echocardiogram examinations.                  Risk Factors:Hypertension. Anxiety, tobacco abuse.  Sonographer:     Cristela Blue Referring Phys:  [id number] Marrion Coy Diagnosing Phys: Lorine Bears MD IMPRESSIONS  1. Left ventricular ejection fraction, by estimation, is 60 to 65%. The left ventricle has normal function. The left ventricle has no regional wall motion abnormalities. Left ventricular diastolic parameters are consistent with Grade I diastolic dysfunction (impaired relaxation).  2. Right ventricular systolic function is normal. The right ventricular size is normal. Tricuspid regurgitation signal is inadequate for assessing PA pressure.  3. The mitral valve is normal  in structure. No evidence of mitral valve regurgitation. No evidence of mitral stenosis.  4. The aortic valve is normal in structure. Aortic valve regurgitation is not visualized. No aortic stenosis is present.  5. The inferior vena cava is normal in size with greater than 50% respiratory variability, suggesting right atrial pressure of 3 mmHg. FINDINGS  Left Ventricle: Left ventricular ejection fraction, by estimation, is 60 to 65%. The left ventricle has normal function. The left ventricle has no regional wall motion abnormalities. The left ventricular internal cavity size was normal in size. There is  no left ventricular hypertrophy. Left ventricular diastolic parameters are consistent with Grade I diastolic dysfunction (impaired relaxation). Right Ventricle: The right ventricular size is normal. No increase in right ventricular wall thickness. Right ventricular systolic function is normal. Tricuspid regurgitation signal is inadequate for assessing PA pressure. Left Atrium: Left atrial size was normal in size. Right Atrium: Right atrial size was normal in size. Pericardium: There is no evidence of pericardial effusion. Mitral Valve: The mitral valve is normal in structure. No evidence of mitral valve regurgitation. No evidence of mitral valve stenosis. Tricuspid Valve: The tricuspid valve is normal in structure. Tricuspid valve regurgitation is trivial. No evidence of tricuspid stenosis. Aortic Valve: The aortic valve is normal in structure. Aortic valve regurgitation is not visualized. No aortic stenosis is present. Aortic valve mean gradient measures 3.5 mmHg. Aortic valve peak gradient measures 6.2 mmHg. Aortic valve area, by VTI measures 2.55 cm. Pulmonic Valve: The pulmonic valve was normal in structure. Pulmonic valve regurgitation is not visualized. No evidence of pulmonic stenosis. Aorta: The aortic root is normal in size and structure. Venous: The inferior vena cava is normal in size with greater than  50% respiratory variability, suggesting right atrial pressure of 3 mmHg. IAS/Shunts: No atrial level shunt detected by color flow Doppler.  LEFT VENTRICLE PLAX 2D LVIDd:         4.50 cm  Diastology LVIDs:         2.60 cm  LV e' medial:    5.98 cm/s LV PW:         0.90 cm  LV E/e' medial:  12.1 LV IVS:        0.65 cm  LV e' lateral:   9.90 cm/s LVOT diam:     2.00 cm  LV E/e' lateral: 7.3 LV SV:         64 LV SV Index:   39 LVOT Area:     3.14 cm  RIGHT VENTRICLE RV Basal diam:  3.10 cm LEFT ATRIUM             Index       RIGHT ATRIUM           Index LA diam:  3.20 cm 1.96 cm/m  RA Area:     12.70 cm LA Vol (A2C):   34.0 ml 20.82 ml/m RA Volume:   31.40 ml  19.23 ml/m LA Vol (A4C):   21.7 ml 13.29 ml/m LA Biplane Vol: 28.6 ml 17.52 ml/m  AORTIC VALVE                   PULMONIC VALVE AV Area (Vmax):    2.31 cm    PV Vmax:        0.57 m/s AV Area (Vmean):   2.28 cm    PV Peak grad:   1.3 mmHg AV Area (VTI):     2.55 cm    RVOT Peak grad: 2 mmHg AV Vmax:           125.00 cm/s AV Vmean:          87.800 cm/s AV VTI:            0.253 m AV Peak Grad:      6.2 mmHg AV Mean Grad:      3.5 mmHg LVOT Vmax:         92.00 cm/s LVOT Vmean:        63.600 cm/s LVOT VTI:          0.205 m LVOT/AV VTI ratio: 0.81  AORTA Ao Root diam: 3.10 cm MITRAL VALVE               TRICUSPID VALVE MV Area (PHT): 3.17 cm    TR Peak grad:   18.3 mmHg MV Decel Time: 239 msec    TR Vmax:        214.00 cm/s MV E velocity: 72.40 cm/s MV A velocity: 98.10 cm/s  SHUNTS MV E/A ratio:  0.74        Systemic VTI:  0.20 m                            Systemic Diam: 2.00 cm Lorine Bears MD Electronically signed by Lorine Bears MD Signature Date/Time: 06/02/2021/1:19:31 PM    Final     Assessment/Plan  Stroke [facility]) Likely from significant carotid disease.  See plan as below.  Carotid stenosis The patient has a relatively high-grade right ICA stenosis and at least a moderate if not high-grade left ICA stenosis.  She has had recent stroke  with positive findings on both sides on MRI.  At this point, she would benefit from intervention of the right side clearly, and potentially the left side as well.  These cannot be done concomitantly.  The right side would be addressed first.  We discussed the differences between carotid endarterectomy and carotid stenting.  Her lesion is an optically calcified and does not look particularly tortuous, and her anatomy seems reasonably good for carotid stenting.  She is elected to proceed with that route on the right.  Following right-sided intervention, we will be evaluating her left carotid stenosis and would consider intervention at a later date if it does appear to be greater than 60% going forward as it appears symptomatic as well.  Risks and benefits of the procedure were discussed with the patient in detail and she is agreeable to proceed.  A prescription was given for Plavix today and she should be on this for at least 5 days prior to her intervention.  Essential hypertension, benign blood pressure control important in reducing the progression of atherosclerotic disease. On appropriate oral medications.  HLD (hyperlipidemia) lipid control important in reducing the progression of atherosclerotic disease. Continue statin therapy    [name], MD  06/06/2021 6:48 PM    This note was created with Dragon medical transcription system.  Any errors from dictation are purely unintentional

## 2021-06-06 NOTE — Assessment & Plan Note (Signed)
blood pressure control important in reducing the progression of atherosclerotic disease. On appropriate oral medications.  

## 2021-06-06 NOTE — Patient Instructions (Signed)
Carotid Angioplasty With Stent °Carotid angioplasty with stent is a procedure to open or widen an artery in the neck (carotid artery) that has become narrowed. This is done by inflating a small balloon inside the artery and then placing a small piece of metal that looks like a coil or spring (stent) inside the artery. The stent helps keep the artery open by supporting the artery walls. °The carotid arteries supply blood to the brain. When fats, cholesterol, and other materials (plaque) build up in an artery, the artery becomes narrow and can become blocked. This can reduce or block blood flow to certain areas of the brain, which can cause serious health problems, including stroke. °Tell a health care provider about: °Any allergies you have. °All medicines you are taking, including vitamins, herbs, eye drops, creams, and over-the-counter medicines. °Any problems you or family members have had with anesthetic medicines. °Any blood disorders you have. °Any surgeries you have had. °Any medical conditions you have. °Whether you are pregnant or may be pregnant. °What are the risks? °Generally, this is a safe procedure. However, problems may occur, including: °Infection. °Bleeding. °Allergic reactions to medicines or dyes. °Damage to other structures or organs, or to the carotid artery itself. °The carotid artery becoming blocked again. °A collection of blood under the skin (hematoma) around the stent site that gets larger. °A blood clot in another part of the body. °Kidney injury. °Stroke. °Heart attack. °What happens before the procedure? °Ask your health care provider about: °Changing or stopping your regular medicines. This is especially important if you are taking diabetes medicines or blood thinners. °Whether aspirin is recommended before this procedure. °Taking over-the-counter medicines, vitamins, herbs, and supplements. °Follow instructions from your health care provider about eating or drinking restrictions. °Do  not use any products that contain nicotine or tobacco for 4 weeks before the procedure. These products include cigarettes, e-cigarettes, and chewing tobacco. If you need help quitting, ask your health care provider. °Ask your health care provider what steps will be taken to help prevent infection. These may include: °Removing hair at the surgery site. °Washing skin with a germ-killing soap. °Taking antibiotic medicine. °You may have blood tests and imaging tests done. °Plan to have someone take you home from the hospital or clinic. °If you will be going home right after the procedure, plan to have someone with you for 24 hours. °What happens during the procedure? ° °An IV will be inserted into one of your veins. °You may be given one or more of the following: °A medicine to help you relax (sedative). °A medicine to numb the area where the catheter will be inserted (local anesthetic). °Most commonly, an incision will be made in your groin. In some cases, an incision may be made in your wrist or forearm instead of your groin. °A small, thin tube (catheter) will be inserted through your incision, into an artery. The catheter will be threaded upward into your carotid artery. An X-ray machine (fluoroscope) will help your health care provider guide the catheter to the correct place in your artery. °Dye will be injected into the catheter and will travel to the narrow or blocked part of your carotid artery. °X-ray images will be taken of how the dye flows through your artery. While the images are being taken, you may be given instructions about breathing, swallowing, moving, or talking. °A filter (distal protection device) will be inserted into your artery. This will be used to catch plaque that comes loose in your artery   during the procedure. This reduces the risk of plaque moving into your brain. °A small balloon will be inserted into your artery. The balloon will be inflated for a few seconds to widen your artery and  will then be removed. °The stent will be placed in your artery. °A second small balloon will be inserted into your artery and inflated. This expands the stent inside of your artery so that the stent holds up the artery walls. The balloon will then be removed. °The catheter and the distal protection device will be removed from your artery. °Your incision may be closed with stitches (sutures), skin glue, or adhesive tape. °A bandage (dressing) will be placed over your incision. °The procedure may vary among health care providers and hospitals. °What happens after the procedure? °Your blood pressure, heart rate, breathing rate, and blood oxygen level will be monitored until you leave the hospital or clinic. °You may continue to receive fluids and medicines through an IV. °You may need to have pressure placed on the incision site to prevent bleeding. °You will need to keep the area still for a few hours, or as long as directed by your health care provider. If the procedure was done in the groin, you will be instructed not to bend or cross your legs. °You may have some pain. Pain medicines will be available to help you. °You may have a test that uses sound waves to take pictures (ultrasound) of the carotid artery. This can be compared to future tests to check for changes in the artery. °Do not drive for 24 hours. °Summary °Carotid angioplasty with stent is a procedure to open or widen an artery in the neck (carotid artery) that has become narrowed. °The procedure is done to lower the risk of problems that can result from reduced blood flow to the brain, including a stroke. °The stent placed inside the artery will help keep the artery open by supporting the artery walls. °Follow instructions from your health care provider about taking medicines and about eating and drinking before the procedure. °This information is not intended to replace advice given to you by your health care provider. Make sure you discuss any  questions you have with your health care provider. °Document Revised: 11/08/2020 Document Reviewed: 11/08/2020 °Elsevier Patient Education © 2022 [employer] ° °

## 2021-06-06 NOTE — Progress Notes (Signed)
  MRN : [id number]  Kim Love is a 65 y.o. (01/30/1956) female who presents with chief complaint of  Chief Complaint  Patient presents with   New Patient (Initial Visit)    [facility] consult for carotid stenosis  .  History of Present Illness: Patient returns today in follow up of her carotid disease and recent stroke.  Her symptoms started a week and a half or so ago.  They were visual primarily although there was some lack of coordination presyncopal issues as well.  She did not have any facial droop.  Her speech seems reasonably good without obvious aphasia.  She was seen in the hospital last week and we discussed her findings which I have independently reviewed.  She had an MRI showing some bilateral infarcts.  She had a CT angiogram which I have independently reviewed.  This demonstrated a 70% or greater right ICA stenosis and reported as a 50 to 60% left ICA stenosis although it may be slightly worse than this.  She has gone home on aspirin and a statin agent.  No recurring symptoms.  She is here today to discuss treatment options.  Current Outpatient Medications  Medication Sig Dispense Refill   amLODipine (NORVASC) 5 MG tablet TAKE 1 TABLET BY MOUTH EVERY DAY (Patient taking differently: Take 5 mg by mouth at bedtime.) 90 tablet 3   aspirin EC 325 MG EC tablet Take 1 tablet (325 mg total) by mouth daily. 30 tablet 0   atorvastatin (LIPITOR) 40 MG tablet Take 1 tablet (40 mg total) by mouth daily. 30 tablet 0   cholecalciferol (VITAMIN D3) 25 MCG (1000 UNIT) tablet Take 1,000 Units by mouth daily.     citalopram (CELEXA) 20 MG tablet TAKE 1 TABLET BY MOUTH EVERY DAY 90 tablet 1   fluticasone (FLONASE) 50 MCG/ACT nasal spray Place 2 sprays into both nostrils daily. (Patient taking differently: Place 2 sprays into both nostrils as needed.) 16 g 6   hydrocortisone-pramoxine (ANALPRAM HC) 2.5-1 % rectal cream Place 1 application rectally 2 (two) times daily as needed for hemorrhoids or anal  itching. 30 g 0   losartan-hydrochlorothiazide (HYZAAR) 100-25 MG tablet TAKE 1 TABLET BY MOUTH EVERY DAY 90 tablet 3   vitamin B-12 (CYANOCOBALAMIN) 1000 MCG tablet Take 1,000 mcg by mouth daily.     No current facility-administered medications for this visit.    Past Medical History:  Diagnosis Date   Anxiety    Hypercholesterolemia    Hypertension    Tobacco abuse     Past Surgical History:  Procedure Laterality Date   TONSILLECTOMY AND ADENOIDECTOMY Bilateral 1980     Social History   Tobacco Use   Smoking status: Former    Types: Cigarettes    Quit date: 09/10/2012    Years since quitting: 8.7   Smokeless tobacco: Current  Substance Use Topics   Alcohol use: No    Alcohol/week: 0.0 standard drinks   Drug use: No       Family History  Problem Relation Age of Onset   Stroke Maternal Grandmother    No bleeding disorders, clotting disorders, autoimmune diseases, or aneurysms  Allergies  Allergen Reactions   Povidone-Iodine Swelling   Sulfamethoxazole-Trimethoprim Nausea And Vomiting   Atenolol Other (See Comments)    Bradycardia   Codeine     REACTION: "gallbladder issues"   Erythromycin     REACTION: nausea   Erythromycin     Upset stomach   Olmesartan       Other reaction(s): Other (See Comments) Increased BP.   Sulfa Antibiotics    Codeine     Gallbladder issues     REVIEW OF SYSTEMS (Negative unless checked)  Constitutional: []Weight loss  []Fever  []Chills Cardiac: []Chest pain   []Chest pressure   []Palpitations   []Shortness of breath when laying flat   []Shortness of breath at rest   []Shortness of breath with exertion. 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  Physical Examination  BP 130/77 (BP Location: Right Arm)   Pulse 67   Resp 16   Wt 135 lb 12.8 oz (61.6 kg)   BMI 24.84 kg/m  Gen:  WD/WN, NAD Head: [place]/AT, No temporalis wasting. Ear/Nose/Throat: Hearing grossly intact, nares w/o erythema or drainage Eyes: Conjunctiva clear. Sclera non-icteric Neck: Supple.  Trachea midline.  Bilateral carotid bruits Pulmonary:  Good air movement, no use of accessory muscles.  Cardiac: RRR, no JVD Vascular:  Vessel Right Left  Radial Palpable Palpable               Musculoskeletal: M/S 5/5 throughout.  No deformity or atrophy. No edema. Neurologic: Sensation grossly intact in extremities.  Symmetrical.  Speech is fluent.  Psychiatric: Judgment intact, Mood & affect appropriate for pt's clinical situation. Dermatologic: No rashes or ulcers noted.  No cellulitis or open wounds.      Labs Recent Results (from the past 2160 hour(s))  Comprehensive metabolic panel     Status: Abnormal   Collection Time: 04/11/21  7:49 AM  Result Value Ref Range   Sodium 138 135 - 145 mEq/L   Potassium 3.3 (L) 3.5 - 5.1 mEq/L   Chloride 100 96 - 112 mEq/L   CO2 28 19 - 32 mEq/L    Glucose, Bld 90 70 - 99 mg/dL   BUN 10 6 - 23 mg/dL   Creatinine, Ser 0.81 0.40 - 1.20 mg/dL   Total Bilirubin 0.5 0.2 - 1.2 mg/dL   Alkaline Phosphatase 84 39 - 117 U/L   AST 12 0 - 37 U/L   ALT 8 0 - 35 U/L   Total Protein 7.1 6.0 - 8.3 g/dL   Albumin 4.0 3.5 - 5.2 g/dL   GFR 77.00 >60.00 mL/min    Comment: Calculated using the CKD-EPI Creatinine   Equation (2021)   Calcium 8.8 8.4 - 10.5 mg/dL  Lipid panel     Status: Abnormal   Collection Time: 04/11/21  7:49 AM  Result Value Ref Range   Cholesterol 202 (H) 0 - 200 mg/dL    Comment: ATP III Classification       Desirable:  < 200 mg/dL               Borderline High:  200 - 239 mg/dL          High:  > = 240 mg/dL   Triglycerides 102.0 0.0 - 149.0 mg/dL    Comment: Normal:  <150 mg/dLBorderline High:  150 - 199 mg/dL   HDL 40.00 >39.00 mg/dL   VLDL 20.4 0.0 - 40.0 mg/dL   LDL Cholesterol 142 (H) 0 - 99 mg/dL   Total CHOL/HDL Ratio 5     Comment:                Men          Women1/2 Average Risk     3.4          3.3Average Risk          5.0          4.42X Average Risk          9.6          7.13X Average Risk          15.0          11.0                       NonHDL 162.21     Comment: NOTE:  Non-HDL goal should be 30 mg/dL higher than patient's LDL goal (i.e. LDL goal of < 70 mg/dL, would have non-HDL goal of < 100 mg/dL)  Potassium     Status: None   Collection Time: 04/25/21  9:07 AM  Result Value Ref Range   Potassium 4.0 3.5 - 5.1 mEq/L  CBG monitoring, ED     Status: Abnormal   Collection Time: 05/28/21  5:29 PM  Result Value Ref Range   Glucose-Capillary 124 (H) 70 - 99 mg/dL    Comment: Glucose reference range applies only to samples taken after fasting for at least 8 hours.   Comment 1 Notify RN    Comment 2 Document in Chart   Ethanol     Status: None   Collection Time: 05/28/21  6:06 PM  Result Value Ref Range   Alcohol, Ethyl (B) <10 <10 mg/dL    Comment: (NOTE) Lowest detectable limit for serum alcohol is 10  mg/dL.  For medical purposes only. Performed at [facility], [address] [place], [place] [address]   Protime-INR     Status: None   Collection Time: 05/28/21  6:06 PM  Result Value Ref Range   Prothrombin Time 12.7 11.4 - 15.2 seconds   INR 1.0 0.8 - 1.2    Comment: (NOTE) INR goal varies based on device and disease states. Performed at [facility], [address] [place], [place] [address]   APTT     Status: None   Collection Time: 05/28/21  6:06 PM  Result Value Ref Range   aPTT 34 24 - 36 seconds    Comment: Performed at [facility], [address] , [place] [address]  CBC     Status: Abnormal   Collection Time: 05/28/21    6:06 PM  Result Value Ref Range   WBC 7.4 4.0 - 10.5 K/uL   RBC 5.01 3.87 - 5.11 MIL/uL   Hemoglobin 15.2 (H) 12.0 - 15.0 g/dL   HCT 45.3 36.0 - 46.0 %   MCV 90.4 80.0 - 100.0 fL   MCH 30.3 26.0 - 34.0 pg   MCHC 33.6 30.0 - 36.0 g/dL   RDW 13.1 11.5 - 15.5 %   Platelets 285 150 - 400 K/uL   nRBC 0.0 0.0 - 0.2 %    Comment: Performed at [facility], [address] [place], [place] [address]  Differential     Status: None   Collection Time: 05/28/21  6:06 PM  Result Value Ref Range   Neutrophils Relative % 68 %   Neutro Abs 5.1 1.7 - 7.7 K/uL   Lymphocytes Relative 21 %   Lymphs Abs 1.5 0.7 - 4.0 K/uL   Monocytes Relative 6 %   Monocytes Absolute 0.4 0.1 - 1.0 K/uL   Eosinophils Relative 4 %   Eosinophils Absolute 0.3 0.0 - 0.5 K/uL   Basophils Relative 1 %   Basophils Absolute 0.0 0.0 - 0.1 K/uL   Immature Granulocytes 0 %   Abs Immature Granulocytes 0.01 0.00 - 0.07 K/uL    Comment: Performed at [facility], [address] [place], [place] [address]  Comprehensive metabolic panel     Status: Abnormal   Collection Time: 05/28/21  6:06 PM  Result Value Ref Range   Sodium 144 135 - 145 mmol/L   Potassium 3.2 (L) 3.5 - 5.1 mmol/L    Chloride 108 98 - 111 mmol/L   CO2 29 22 - 32 mmol/L   Glucose, Bld 118 (H) 70 - 99 mg/dL    Comment: Glucose reference range applies only to samples taken after fasting for at least 8 hours.   BUN 11 8 - 23 mg/dL   Creatinine, Ser 0.88 0.44 - 1.00 mg/dL   Calcium 9.4 8.9 - 10.3 mg/dL   Total Protein 7.7 6.5 - 8.1 g/dL   Albumin 4.0 3.5 - 5.0 g/dL   AST 15 15 - 41 U/L   ALT 11 0 - 44 U/L   Alkaline Phosphatase 90 38 - 126 U/L   Total Bilirubin 0.5 0.3 - 1.2 mg/dL   GFR, Estimated >60 >60 mL/min    Comment: (NOTE) Calculated using the CKD-EPI Creatinine Equation (2021)    Anion gap 7 5 - 15    Comment: Performed at [facility], [address] , [place] [address]  I-stat chem 8, ED     Status: Abnormal   Collection Time: 05/28/21  6:15 PM  Result Value Ref Range   Sodium 140 135 - 145 mmol/L   Potassium 3.0 (L) 3.5 - 5.1 mmol/L   Chloride 101 98 - 111 mmol/L   BUN 9 8 - 23 mg/dL   Creatinine, Ser 0.90 0.44 - 1.00 mg/dL   Glucose, Bld 114 (H) 70 - 99 mg/dL    Comment: Glucose reference range applies only to samples taken after fasting for at least 8 hours.   Calcium, Ion 1.16 1.15 - 1.40 mmol/L   TCO2 28 22 - 32 mmol/L   Hemoglobin 15.3 (H) 12.0 - 15.0 g/dL   HCT 45.0 36.0 - 46.0 %  Resp Panel by RT-PCR (Flu A&B, Covid) Nasopharyngeal Swab     Status: None   Collection Time: 05/28/21  8:33 PM   Specimen: Nasopharyngeal Swab;   Nasopharyngeal(NP) swabs in vial transport medium  Result Value Ref Range   SARS Coronavirus 2 by RT PCR NEGATIVE NEGATIVE    Comment: (NOTE) SARS-CoV-2 target nucleic acids are NOT DETECTED.  The SARS-CoV-2 RNA is generally detectable in upper respiratory specimens during the acute phase of infection. The lowest concentration of SARS-CoV-2 viral copies this assay can detect is 138 copies/mL. A negative result does not preclude SARS-Cov-2 infection and should not be used as the sole basis for treatment or other patient  management decisions. A negative result may occur with  improper specimen collection/handling, submission of specimen other than nasopharyngeal swab, presence of viral mutation(s) within the areas targeted by this assay, and inadequate number of viral copies(<138 copies/mL). A negative result must be combined with clinical observations, patient history, and epidemiological information. The expected result is Negative.  Fact Sheet for Patients:  https://www.fda.gov/media/152166/download  Fact Sheet for Healthcare Providers:  https://www.fda.gov/media/152162/download  This test is no t yet approved or cleared by the [place] FDA and  has been authorized for detection and/or diagnosis of SARS-CoV-2 by FDA under an Emergency Use Authorization (EUA). This EUA will remain  in effect (meaning this test can be used) for the duration of the COVID-19 declaration under Section 564(b)(1) of the Act, 21 U.S.C.section 360bbb-3(b)(1), unless the authorization is terminated  or revoked sooner.       Influenza A by PCR NEGATIVE NEGATIVE   Influenza B by PCR NEGATIVE NEGATIVE    Comment: (NOTE) The Xpert Xpress SARS-CoV-2/FLU/RSV plus assay is intended as an aid in the diagnosis of influenza from Nasopharyngeal swab specimens and should not be used as a sole basis for treatment. Nasal washings and aspirates are unacceptable for Xpert Xpress SARS-CoV-2/FLU/RSV testing.  Fact Sheet for Patients: https://www.fda.gov/media/152166/download  Fact Sheet for Healthcare Providers: https://www.fda.gov/media/152162/download  This test is not yet approved or cleared by the [place] FDA and has been authorized for detection and/or diagnosis of SARS-CoV-2 by FDA under an Emergency Use Authorization (EUA). This EUA will remain in effect (meaning this test can be used) for the duration of the COVID-19 declaration under Section 564(b)(1) of the Act, 21 U.S.C. section 360bbb-3(b)(1), unless the  authorization is terminated or revoked.  Performed at [facility], [address] [place], [place] [address]   Urine rapid drug screen (hosp performed)     Status: None   Collection Time: 05/28/21  8:39 PM  Result Value Ref Range   Opiates NONE DETECTED NONE DETECTED   Cocaine NONE DETECTED NONE DETECTED   Benzodiazepines NONE DETECTED NONE DETECTED   Amphetamines NONE DETECTED NONE DETECTED   Tetrahydrocannabinol NONE DETECTED NONE DETECTED   Barbiturates NONE DETECTED NONE DETECTED    Comment: (NOTE) DRUG SCREEN FOR MEDICAL PURPOSES ONLY.  IF CONFIRMATION IS NEEDED FOR ANY PURPOSE, NOTIFY LAB WITHIN 5 DAYS.  LOWEST DETECTABLE LIMITS FOR URINE DRUG SCREEN Drug Class                     Cutoff (ng/mL) Amphetamine and metabolites    1000 Barbiturate and metabolites    200 Benzodiazepine                 200 Tricyclics and metabolites     300 Opiates and metabolites        300 Cocaine and metabolites        300 THC                              50 Performed at [facility], [address] [place], [place] [address]   Urinalysis, Routine w reflex microscopic     Status: Abnormal   Collection Time: 05/28/21  8:39 PM  Result Value Ref Range   Color, Urine YELLOW (A) YELLOW   APPearance CLEAR (A) CLEAR   Specific Gravity, Urine <1.005 (L) 1.005 - 1.030   pH 7.0 5.0 - 8.0   Glucose, UA NEGATIVE NEGATIVE mg/dL   Hgb urine dipstick SMALL (A) NEGATIVE   Bilirubin Urine NEGATIVE NEGATIVE   Ketones, ur NEGATIVE NEGATIVE mg/dL   Protein, ur NEGATIVE NEGATIVE mg/dL   Nitrite NEGATIVE NEGATIVE   Leukocytes,Ua TRACE (A) NEGATIVE   RBC / HPF 0-5 0 - 5 RBC/hpf   WBC, UA 0-5 0 - 5 WBC/hpf   Bacteria, UA NONE SEEN NONE SEEN   Squamous Epithelial / LPF 0-5 0 - 5   Mucus PRESENT     Comment: Performed at [facility], [address] [place], [place] [address]  Protime-INR     Status: None   Collection Time: 06/01/21 10:23 AM   Result Value Ref Range   Prothrombin Time 12.9 11.4 - 15.2 seconds   INR 1.0 0.8 - 1.2    Comment: (NOTE) INR goal varies based on device and disease states. Performed at [facility], [address] [place], [place] [address]   APTT     Status: Abnormal   Collection Time: 06/01/21 10:23 AM  Result Value Ref Range   aPTT 37 (H) 24 - 36 seconds    Comment:        IF BASELINE aPTT IS ELEVATED, SUGGEST PATIENT RISK ASSESSMENT BE USED TO DETERMINE APPROPRIATE ANTICOAGULANT THERAPY. Performed at [facility], [address] [place], [place] [address]   CBC     Status: Abnormal   Collection Time: 06/01/21 10:23 AM  Result Value Ref Range   WBC 5.8 4.0 - 10.5 K/uL   RBC 4.82 3.87 - 5.11 MIL/uL   Hemoglobin 15.1 (H) 12.0 - 15.0 g/dL   HCT 43.1 36.0 - 46.0 %   MCV 89.4 80.0 - 100.0 fL   MCH 31.3 26.0 - 34.0 pg   MCHC 35.0 30.0 - 36.0 g/dL   RDW 13.0 11.5 - 15.5 %   Platelets 255 150 - 400 K/uL   nRBC 0.0 0.0 - 0.2 %    Comment: Performed at [facility], [address] [place], [place] [address]  Differential     Status: None   Collection Time: 06/01/21 10:23 AM  Result Value Ref Range   Neutrophils Relative % 64 %   Neutro Abs 3.7 1.7 - 7.7 K/uL   Lymphocytes Relative 23 %   Lymphs Abs 1.4 0.7 - 4.0 K/uL   Monocytes Relative 7 %   Monocytes Absolute 0.4 0.1 - 1.0 K/uL   Eosinophils Relative 5 %   Eosinophils Absolute 0.3 0.0 - 0.5 K/uL   Basophils Relative 1 %   Basophils Absolute 0.1 0.0 - 0.1 K/uL   Immature Granulocytes 0 %   Abs Immature Granulocytes 0.02 0.00 - 0.07 K/uL    Comment: Performed at [facility], [address] [place], [place] [address]  Comprehensive metabolic panel     Status: Abnormal   Collection Time: 06/01/21 10:23 AM  Result Value Ref Range   Sodium 135 135 - 145 mmol/L   Potassium 3.2 (L) 3.5 - 5.1 mmol/L   Chloride 95 (L) 98 - 111   mmol/L   CO2 29 22 - 32 mmol/L   Glucose, Bld 104 (H) 70 - 99  mg/dL    Comment: Glucose reference range applies only to samples taken after fasting for at least 8 hours.   BUN 10 8 - 23 mg/dL   Creatinine, Ser 0.64 0.44 - 1.00 mg/dL   Calcium 9.1 8.9 - 10.3 mg/dL   Total Protein 7.2 6.5 - 8.1 g/dL   Albumin 3.9 3.5 - 5.0 g/dL   AST 17 15 - 41 U/L   ALT 9 0 - 44 U/L   Alkaline Phosphatase 84 38 - 126 U/L   Total Bilirubin 0.7 0.3 - 1.2 mg/dL   GFR, Estimated >60 >60 mL/min    Comment: (NOTE) Calculated using the CKD-EPI Creatinine Equation (2021)    Anion gap 11 5 - 15    Comment: Performed at [facility], [address] [place], [place] [address]  Resp Panel by RT-PCR (Flu A&B, Covid) Nasopharyngeal Swab     Status: None   Collection Time: 06/01/21  2:16 PM   Specimen: Nasopharyngeal Swab; Nasopharyngeal(NP) swabs in vial transport medium  Result Value Ref Range   SARS Coronavirus 2 by RT PCR NEGATIVE NEGATIVE    Comment: (NOTE) SARS-CoV-2 target nucleic acids are NOT DETECTED.  The SARS-CoV-2 RNA is generally detectable in upper respiratory specimens during the acute phase of infection. The lowest concentration of SARS-CoV-2 viral copies this assay can detect is 138 copies/mL. A negative result does not preclude SARS-Cov-2 infection and should not be used as the sole basis for treatment or other patient management decisions. A negative result may occur with  improper specimen collection/handling, submission of specimen other than nasopharyngeal swab, presence of viral mutation(s) within the areas targeted by this assay, and inadequate number of viral copies(<138 copies/mL). A negative result must be combined with clinical observations, patient history, and epidemiological information. The expected result is Negative.  Fact Sheet for Patients:  https://www.fda.gov/media/152166/download  Fact Sheet for Healthcare Providers:  https://www.fda.gov/media/152162/download  This test is no t yet approved or cleared by the [place]  [place] FDA and  has been authorized for detection and/or diagnosis of SARS-CoV-2 by FDA under an Emergency Use Authorization (EUA). This EUA will remain  in effect (meaning this test can be used) for the duration of the COVID-19 declaration under Section 564(b)(1) of the Act, 21 U.S.C.section 360bbb-3(b)(1), unless the authorization is terminated  or revoked sooner.       Influenza A by PCR NEGATIVE NEGATIVE   Influenza B by PCR NEGATIVE NEGATIVE    Comment: (NOTE) The Xpert Xpress SARS-CoV-2/FLU/RSV plus assay is intended as an aid in the diagnosis of influenza from Nasopharyngeal swab specimens and should not be used as a sole basis for treatment. Nasal washings and aspirates are unacceptable for Xpert Xpress SARS-CoV-2/FLU/RSV testing.  Fact Sheet for Patients: https://www.fda.gov/media/152166/download  Fact Sheet for Healthcare Providers: https://www.fda.gov/media/152162/download  This test is not yet approved or cleared by the [place] FDA and has been authorized for detection and/or diagnosis of SARS-CoV-2 by FDA under an Emergency Use Authorization (EUA). This EUA will remain in effect (meaning this test can be used) for the duration of the COVID-19 declaration under Section 564(b)(1) of the Act, 21 U.S.C. section 360bbb-3(b)(1), unless the authorization is terminated or revoked.  Performed at [facility], [address] [place], [place] [address]   Urine Drug Screen, Qualitative ([facility] only)     Status: None   Collection Time: 06/01/21  6:47   PM  Result Value Ref Range   Tricyclic, Ur Screen NONE DETECTED NONE DETECTED   Amphetamines, Ur Screen NONE DETECTED NONE DETECTED   MDMA (Ecstasy)Ur Screen NONE DETECTED NONE DETECTED   Cocaine Metabolite,Ur [place] NONE DETECTED NONE DETECTED   Opiate, Ur Screen NONE DETECTED NONE DETECTED   Phencyclidine (PCP) Ur S NONE DETECTED NONE DETECTED   Cannabinoid 50 Ng, Ur [place] NONE DETECTED NONE DETECTED    Barbiturates, Ur Screen NONE DETECTED NONE DETECTED   Benzodiazepine, Ur Scrn NONE DETECTED NONE DETECTED   Methadone Scn, Ur NONE DETECTED NONE DETECTED    Comment: (NOTE) Tricyclics + metabolites, urine    Cutoff 1000 ng/mL Amphetamines + metabolites, urine  Cutoff 1000 ng/mL MDMA (Ecstasy), urine              Cutoff 500 ng/mL Cocaine Metabolite, urine          Cutoff 300 ng/mL Opiate + metabolites, urine        Cutoff 300 ng/mL Phencyclidine (PCP), urine         Cutoff 25 ng/mL Cannabinoid, urine                 Cutoff 50 ng/mL Barbiturates + metabolites, urine  Cutoff 200 ng/mL Benzodiazepine, urine              Cutoff 200 ng/mL Methadone, urine                   Cutoff 300 ng/mL  The urine drug screen provides only a preliminary, unconfirmed analytical test result and should not be used for non-medical purposes. Clinical consideration and professional judgment should be applied to any positive drug screen result due to possible interfering substances. A more specific alternate chemical method must be used in order to obtain a confirmed analytical result. Gas chromatography / mass spectrometry (GC/MS) is the preferred confirm atory method. Performed at [facility], [address] [place], [place] [address]   Magnesium     Status: None   Collection Time: 06/02/21  5:48 AM  Result Value Ref Range   Magnesium 2.2 1.7 - 2.4 mg/dL    Comment: Performed at [facility], [address] [place], [place] [address]  Hemoglobin A1c     Status: Abnormal   Collection Time: 06/02/21  5:48 AM  Result Value Ref Range   Hgb A1c MFr Bld 5.7 (H) 4.8 - 5.6 %    Comment: (NOTE)         Prediabetes: 5.7 - 6.4         Diabetes: >6.4         Glycemic control for adults with diabetes: <7.0    Mean Plasma Glucose 117 mg/dL    Comment: (NOTE) Performed At: [facility] [address] [place], [place] [id number] Nagendra Sanjai MD Ph:[phone]   Lipid panel      Status: Abnormal   Collection Time: 06/02/21  5:48 AM  Result Value Ref Range   Cholesterol 163 0 - 200 mg/dL   Triglycerides 101 <150 mg/dL   HDL 39 (L) >40 mg/dL   Total CHOL/HDL Ratio 4.2 RATIO   VLDL 20 0 - 40 mg/dL   LDL Cholesterol 104 (H) 0 - 99 mg/dL    Comment:        Total Cholesterol/HDL:CHD Risk Coronary Heart Disease Risk Table                     Men   Women    1/2 Average Risk   3.4   3.3  Average Risk       5.0   4.4  2 X Average Risk   9.6   7.1  3 X Average Risk  23.4   11.0        Use the calculated Patient Ratio above and the CHD Risk Table to determine the patient's CHD Risk.        ATP III CLASSIFICATION (LDL):  <100     mg/dL   Optimal  100-129  mg/dL   Near or Above                    Optimal  130-159  mg/dL   Borderline  160-189  mg/dL   High  >190     mg/dL   Very High Performed at [facility], [address] [place], [place] [address]   HIV Antibody (routine testing w rflx)     Status: None   Collection Time: 06/02/21  5:48 AM  Result Value Ref Range   HIV Screen 4th Generation wRfx Non Reactive Non Reactive    Comment: Performed at [facility], [address] [place], [place] [address]  Basic metabolic panel     Status: Abnormal   Collection Time: 06/02/21  5:48 AM  Result Value Ref Range   Sodium 135 135 - 145 mmol/L   Potassium 3.5 3.5 - 5.1 mmol/L   Chloride 100 98 - 111 mmol/L   CO2 26 22 - 32 mmol/L   Glucose, Bld 96 70 - 99 mg/dL    Comment: Glucose reference range applies only to samples taken after fasting for at least 8 hours.   BUN 8 8 - 23 mg/dL   Creatinine, Ser 0.72 0.44 - 1.00 mg/dL   Calcium 8.6 (L) 8.9 - 10.3 mg/dL   GFR, Estimated >60 >60 mL/min    Comment: (NOTE) Calculated using the CKD-EPI Creatinine Equation (2021)    Anion gap 9 5 - 15    Comment: Performed at  [facility], [address] [place], [place] [address]  ECHOCARDIOGRAM COMPLETE     Status: None   Collection Time: 06/02/21  12:19 PM  Result Value Ref Range   Weight 2,208 oz   Height 62 in   BP 162/94 mmHg   Ao pk vel 1.25 m/s   AV Area VTI 2.55 cm2   AR max vel 2.31 cm2   AV Mean grad 3.5 mmHg   AV Peak grad 6.3 mmHg   S' Lateral 2.60 cm   AV Area mean vel 2.28 cm2   Area-P 1/2 3.17 cm2    Radiology CT ANGIO HEAD NECK W WO CM  Result Date: 06/01/2021 CLINICAL DATA:  Stroke/TIA, determine embolic source EXAM: CT ANGIOGRAPHY HEAD AND NECK TECHNIQUE: Multidetector CT imaging of the head and neck was performed using the standard protocol during bolus administration of intravenous contrast. Multiplanar CT image reconstructions and MIPs were obtained to evaluate the vascular anatomy. Carotid stenosis measurements (when applicable) are obtained utilizing NASCET criteria, using the distal internal carotid diameter as the denominator. CONTRAST:  75mL OMNIPAQUE IOHEXOL 350 MG/ML SOLN COMPARISON:  CT head 05/28/2021, MRI head 06/01/2021. FINDINGS: CT HEAD FINDINGS Brain: No evidence of acute infarction, hemorrhage, hydrocephalus, extra-axial collection or mass lesion/mass effect. The infarcts seen on the same-day MRI are not visualized. Vascular: See CTA findings below. Skull: Normal. Negative for fracture or focal lesion. Sinuses: Imaged portions are clear. Orbits: No acute   finding. Review of the MIP images confirms the above findings CTA NECK FINDINGS Aortic arch: 4 vessel arch, with the origin of the left vertebral artery coming from the arch. Moderate calcified and noncalcified atherosclerotic disease. Right carotid system: Greater than 70% stenosis of the proximal right ICA at the bifurcation (series 8, image 73), with post stenotic dilatation, largely secondary to noncalcified plaque. No evidence of dissection. Left carotid system: 50-60% stenosis of the proximal left ICA, secondary to calcified and noncalcified plaque. No evidence of dissection. Vertebral arteries: The left vertebral artery originates from the aortic  arch. Left dominant vertebral system. Focal moderate to severe narrowing of the distal right V4 segment, likely secondary to noncalcified plaque (series 6, image 201). No evidence of dissection or occlusion. Skeleton: Multifocal degenerative changes. No acute osseous abnormality. Other neck: Subcentimeter hypoattenuating lesion in the right thyroid lobe. Possible 1.4 cm lesion in the left thyroid lobe, although evaluation is limited by beam hardening artifact from contrast bolus. Upper chest: No focal pulmonary opacity. Review of the MIP images confirms the above findings CTA HEAD FINDINGS Anterior circulation: Moderate to severe narrowing of the supraclinoid left ICA (series 6, image 216), secondary to calcified and noncalcified plaque. Focal moderate to severe narrowing of the cavernous right ICA (series 6, image 212). A1 segments patent. Normal anterior communicating artery. Anterior cerebral arteries are widely patent to their distal aspects. No M1 stenosis or occlusion. Normal MCA bifurcations. Distal MCA branches perfused and symmetric. Posterior circulation: Left dominant vertebral system. Focal moderate to severe narrowing of the distal right V4 segment, likely secondary to noncalcified plaque (series 6, image 199). The left vertebral artery is patent. Left PICA is visualized. Right AICA is visualized. Basilar patent to its distal aspect. Superior cerebral arteries patent bilaterally. PCAs perfused to their distal aspects without focal stenosis. Venous sinuses: Within the limitations of bolus timing, patent. Anatomic variants: Posterior communicating artery is not visualized. Review of the MIP images confirms the above findings IMPRESSION: 1. Greater than 70% stenosis of the proximal right ICA at the bifurcation, with post stenotic dilatation, largely secondary to noncalcified plaque. 2. 50% to 60% stenosis of the proximal left ICA, secondary to calcified and noncalcified plaque. 3. Moderate to severe  narrowing of the supraclinoid left and cavernous right ICAs. 4. Focal narrowing of the distal right V4 artery. 5. No large vessel occlusion. Electronically Signed   By: Alison  Vasan M.D.   On: 06/01/2021 23:05   CT HEAD WO CONTRAST  Result Date: 05/28/2021 CLINICAL DATA:  Dizziness. EXAM: CT HEAD WITHOUT CONTRAST TECHNIQUE: Contiguous axial images were obtained from the base of the skull through the vertex without intravenous contrast. COMPARISON:  None. FINDINGS: Brain: No evidence of acute infarction, hemorrhage, hydrocephalus, extra-axial collection or mass lesion/mass effect. Vascular: No hyperdense vessel or unexpected calcification. Skull: Normal. Negative for fracture or focal lesion. Sinuses/Orbits: No acute finding. Other: None. IMPRESSION: No acute intracranial abnormality. Electronically Signed   By: Amy  Guttmann M.D.   On: 05/28/2021 18:23   MR BRAIN W WO CONTRAST  Result Date: 06/01/2021 CLINICAL DATA:  Diplopia H53.2 (ICD-10-CM) Vertigo R42 (ICD-10-CM) EXAM: MRI HEAD WITHOUT AND WITH CONTRAST TECHNIQUE: Multiplanar, multiecho pulse sequences of the brain and surrounding structures were obtained without and with intravenous contrast. CONTRAST:  6mL GADAVIST GADOBUTROL 1 MMOL/ML IV SOLN COMPARISON:  CT head 05/28/2021. FINDINGS: Brain: Multiple acute infarcts in the right thalamus with additional punctate acute infarcts in the left parietal white matter. Associated edema without mass effect. Probable small   remote lacunar infarct in the left thalamus where there is focal T2/FLAIR hyperintensity without restricted diffusion. Additional mild-to-moderate scattered T2 hyperintensities within the white matter, nonspecific but compatible with chronic microvascular ischemic disease. No acute hemorrhage, hydrocephalus, mass lesion, midline shift, or extra-axial fluid collection. Vascular: Major arterial flow voids are maintained at the skull base. Skull and upper cervical spine: Normal marrow signal.  Sinuses/Orbits: Mild paranasal sinus mucosal thickening. Unremarkable orbits. Other: No mastoid effusions. Acute findings discussed with the MRI technologist via telephone who will transfer the patient to the emergency room for management of the above findings. IMPRESSION: 1. 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 Associated edema without mass effect. Given involvement of multiple vascular territories, consider a central embolic etiology. 2. Mild-to-moderate chronic microvascular ischemic disease with probable small remote infarct in the left thalamus. Electronically Signed   By: Frederick S Jones M.D.   On: 06/01/2021 10:16   US Carotid Bilateral  Result Date: 06/01/2021 CLINICAL DATA:  Stroke-like symptoms EXAM: BILATERAL CAROTID DUPLEX ULTRASOUND TECHNIQUE: Gray scale imaging, color Doppler and duplex ultrasound were performed of bilateral carotid and vertebral arteries in the neck. COMPARISON:  None. FINDINGS: Criteria: Quantification of carotid stenosis is based on velocity parameters that correlate the residual internal carotid diameter with NASCET-based stenosis levels, using the diameter of the distal internal carotid lumen as the denominator for stenosis measurement. The following velocity measurements were obtained: RIGHT ICA: 164/30 cm/sec CCA: 61/16 cm/sec SYSTOLIC ICA/CCA RATIO:  2.7 ECA: 83 cm/sec LEFT ICA: 79/23 cm/sec CCA: 72/22 cm/sec SYSTOLIC ICA/CCA RATIO:  1.1 ECA: 77 cm/sec RIGHT CAROTID ARTERY: Examination of preliminary grayscale images show minimal intimal thickening. Mild atherosclerotic plaque is noted at the carotid bifurcation. Examination the waveforms, velocities and flow velocity ratios demonstrates a 50-69% stenosis. RIGHT VERTEBRAL ARTERY:  Antegrade in nature. LEFT CAROTID ARTERY: Limited grayscale images demonstrate mild intimal thickening with mild atherosclerotic plaque at the carotid bifurcation. Examination the waveforms, velocities  and flow velocity ratios however demonstrate no evidence of focal hemodynamically significant stenosis. LEFT VERTEBRAL ARTERY:  Antegrade in nature. IMPRESSION: 50-69% stenosis in the right internal carotid artery. No significant stenosis on the left. Electronically Signed   By: Mark  Lukens M.D.   On: 06/01/2021 21:59   DG Chest Portable 1 View  Result Date: 06/01/2021 CLINICAL DATA:  cva EXAM: PORTABLE CHEST 1 VIEW COMPARISON:  None. FINDINGS: The cardiomediastinal silhouette is within normal limits. No pleural effusion. No pneumothorax. No mass or consolidation. No acute osseous abnormality. IMPRESSION: No acute findings in the chest. Electronically Signed   By: Yasser  El-Abd M.D.   On: 06/01/2021 15:22   ECHOCARDIOGRAM COMPLETE  Result Date: 06/02/2021    ECHOCARDIOGRAM REPORT   Patient Name:   Tajana B Gilkerson Date of Exam: 06/02/2021 Medical Rec #:  [id number]   Height:       62.0 in Accession #:    [id number]  Weight:       138.0 lb Date of Birth:  03/24/1956   BSA:          1.633 m Patient Age:    65 years    BP:           162/94 mmHg Patient Gender: F           HR:           70 bpm. Exam Location:  [facility] Procedure: 2D Echo, Cardiac Doppler and Color Doppler Indications:     Stroke I63.9  History:           Patient has no prior history of Echocardiogram examinations.                  Risk Factors:Hypertension. Anxiety, tobacco abuse.  Sonographer:     Jerry Hege Referring Phys:  [id number] DEKUI ZHANG Diagnosing Phys: Muhammad Arida MD IMPRESSIONS  1. Left ventricular ejection fraction, by estimation, is 60 to 65%. The left ventricle has normal function. The left ventricle has no regional wall motion abnormalities. Left ventricular diastolic parameters are consistent with Grade I diastolic dysfunction (impaired relaxation).  2. Right ventricular systolic function is normal. The right ventricular size is normal. Tricuspid regurgitation signal is inadequate for assessing PA pressure.  3. The mitral valve is normal  in structure. No evidence of mitral valve regurgitation. No evidence of mitral stenosis.  4. The aortic valve is normal in structure. Aortic valve regurgitation is not visualized. No aortic stenosis is present.  5. The inferior vena cava is normal in size with greater than 50% respiratory variability, suggesting right atrial pressure of 3 mmHg. FINDINGS  Left Ventricle: Left ventricular ejection fraction, by estimation, is 60 to 65%. The left ventricle has normal function. The left ventricle has no regional wall motion abnormalities. The left ventricular internal cavity size was normal in size. There is  no left ventricular hypertrophy. Left ventricular diastolic parameters are consistent with Grade I diastolic dysfunction (impaired relaxation). Right Ventricle: The right ventricular size is normal. No increase in right ventricular wall thickness. Right ventricular systolic function is normal. Tricuspid regurgitation signal is inadequate for assessing PA pressure. Left Atrium: Left atrial size was normal in size. Right Atrium: Right atrial size was normal in size. Pericardium: There is no evidence of pericardial effusion. Mitral Valve: The mitral valve is normal in structure. No evidence of mitral valve regurgitation. No evidence of mitral valve stenosis. Tricuspid Valve: The tricuspid valve is normal in structure. Tricuspid valve regurgitation is trivial. No evidence of tricuspid stenosis. Aortic Valve: The aortic valve is normal in structure. Aortic valve regurgitation is not visualized. No aortic stenosis is present. Aortic valve mean gradient measures 3.5 mmHg. Aortic valve peak gradient measures 6.2 mmHg. Aortic valve area, by VTI measures 2.55 cm. Pulmonic Valve: The pulmonic valve was normal in structure. Pulmonic valve regurgitation is not visualized. No evidence of pulmonic stenosis. Aorta: The aortic root is normal in size and structure. Venous: The inferior vena cava is normal in size with greater than  50% respiratory variability, suggesting right atrial pressure of 3 mmHg. IAS/Shunts: No atrial level shunt detected by color flow Doppler.  LEFT VENTRICLE PLAX 2D LVIDd:         4.50 cm  Diastology LVIDs:         2.60 cm  LV e' medial:    5.98 cm/s LV PW:         0.90 cm  LV E/e' medial:  12.1 LV IVS:        0.65 cm  LV e' lateral:   9.90 cm/s LVOT diam:     2.00 cm  LV E/e' lateral: 7.3 LV SV:         64 LV SV Index:   39 LVOT Area:     3.14 cm  RIGHT VENTRICLE RV Basal diam:  3.10 cm LEFT ATRIUM             Index       RIGHT ATRIUM           Index LA diam:          3.20 cm 1.96 cm/m  RA Area:     12.70 cm LA Vol (A2C):   34.0 ml 20.82 ml/m RA Volume:   31.40 ml  19.23 ml/m LA Vol (A4C):   21.7 ml 13.29 ml/m LA Biplane Vol: 28.6 ml 17.52 ml/m  AORTIC VALVE                   PULMONIC VALVE AV Area (Vmax):    2.31 cm    PV Vmax:        0.57 m/s AV Area (Vmean):   2.28 cm    PV Peak grad:   1.3 mmHg AV Area (VTI):     2.55 cm    RVOT Peak grad: 2 mmHg AV Vmax:           125.00 cm/s AV Vmean:          87.800 cm/s AV VTI:            0.253 m AV Peak Grad:      6.2 mmHg AV Mean Grad:      3.5 mmHg LVOT Vmax:         92.00 cm/s LVOT Vmean:        63.600 cm/s LVOT VTI:          0.205 m LVOT/AV VTI ratio: 0.81  AORTA Ao Root diam: 3.10 cm MITRAL VALVE               TRICUSPID VALVE MV Area (PHT): 3.17 cm    TR Peak grad:   18.3 mmHg MV Decel Time: 239 msec    TR Vmax:        214.00 cm/s MV E velocity: 72.40 cm/s MV A velocity: 98.10 cm/s  SHUNTS MV E/A ratio:  0.74        Systemic VTI:  0.20 m                            Systemic Diam: 2.00 cm Muhammad Arida MD Electronically signed by Muhammad Arida MD Signature Date/Time: 06/02/2021/1:19:31 PM    Final     Assessment/Plan  Stroke (HCC) Likely from significant carotid disease.  See plan as below.  Carotid stenosis The patient has a relatively high-grade right ICA stenosis and at least a moderate if not high-grade left ICA stenosis.  She has had recent stroke  with positive findings on both sides on MRI.  At this point, she would benefit from intervention of the right side clearly, and potentially the left side as well.  These cannot be done concomitantly.  The right side would be addressed first.  We discussed the differences between carotid endarterectomy and carotid stenting.  Her lesion is an optically calcified and does not look particularly tortuous, and her anatomy seems reasonably good for carotid stenting.  She is elected to proceed with that route on the right.  Following right-sided intervention, we will be evaluating her left carotid stenosis and would consider intervention at a later date if it does appear to be greater than 60% going forward as it appears symptomatic as well.  Risks and benefits of the procedure were discussed with the patient in detail and she is agreeable to proceed.  A prescription was given for Plavix today and she should be on this for at least 5 days prior to her intervention.  Essential hypertension, benign blood pressure control important in reducing the progression of atherosclerotic disease. On appropriate oral medications.     HLD (hyperlipidemia) lipid control important in reducing the progression of atherosclerotic disease. Continue statin therapy    Thurl Boen, MD  06/06/2021 6:48 PM    This note was created with Dragon medical transcription system.  Any errors from dictation are purely unintentional  

## 2021-06-07 ENCOUNTER — Encounter: Payer: Self-pay | Admitting: Family

## 2021-06-07 ENCOUNTER — Telehealth (INDEPENDENT_AMBULATORY_CARE_PROVIDER_SITE_OTHER): Payer: Self-pay

## 2021-06-07 ENCOUNTER — Ambulatory Visit (INDEPENDENT_AMBULATORY_CARE_PROVIDER_SITE_OTHER): Payer: BC Managed Care – PPO | Admitting: Family

## 2021-06-07 ENCOUNTER — Telehealth: Payer: Self-pay

## 2021-06-07 ENCOUNTER — Encounter (INDEPENDENT_AMBULATORY_CARE_PROVIDER_SITE_OTHER): Payer: Self-pay

## 2021-06-07 DIAGNOSIS — I639 Cerebral infarction, unspecified: Secondary | ICD-10-CM

## 2021-06-07 DIAGNOSIS — M542 Cervicalgia: Secondary | ICD-10-CM | POA: Insufficient documentation

## 2021-06-07 NOTE — Telephone Encounter (Signed)
I called Dr. Driscilla Grammes office & left a message on nurse line when unable to reach front desk. I asked that someone call back with clarification on aspirin dose for patient.

## 2021-06-07 NOTE — Patient Instructions (Signed)
Absolute pleasure to meet you.  As discussed, I would try warm compresses or something even like a Biofreeze to the left side of your neck is anticipate some soreness from the ultrasound that you had during her hospitalization.  You may use Tylenol however no anti-inflammatory such as ibuprofen, Aleve or Motrin.    If you continue to feel soreness or have concerns for enlarged lymph node, please let me know right away so I can order imaging.  As discussed as well, please have Dr. Jenne Campus on Friday palpate the area as well.

## 2021-06-07 NOTE — Assessment & Plan Note (Addendum)
Well appearing today. Mild tenderness on exam.  Suspect recent ultrasound, or perhaps URI contributory.  No current URI symptoms.  No discrete lymphadenopathy.  Offered CT head neck and patient politely declines.  She declines any further work-up at this time.  She is seeing ENT in 3 days and I have emphasized the importance of having ENT, Dr. Jenne Campus, also look at the area of concern.  She politely declines a strep test today as well.

## 2021-06-07 NOTE — Telephone Encounter (Signed)
noted 

## 2021-06-07 NOTE — Progress Notes (Signed)
Subjective:    Patient ID: Kim Love, female    DOB: [birth year], 65 y.o.   MRN: [id number]  CC: Kim Love is a 65 y.o. female who presents today for follow up.   HPI: Accompanied by friend today. Soreness under left ear and concerned for enlarged lymph node.  Endorses sore throat and left sided throat pain.  No ear pain,  HA, vision loss, facial numbness, dizziness, fever, sinus congestion. She is swallowing normally, no choking. No trouble finding words.   PND with seasonal allergies.   She hasnt taken medications for this.   She felt pulsing sensation left side of neck which has improved.   Ophthalmology had ordered MRI brain which identified acute CVA 06/01/21  She is seeing [facility] as referred by opthalmology due to trouble focusing left eye. Left eye symptom resolved.   Following with Dr Wyn Quaker with planned  History of tonsillectomy  Appointment with Dr Everlena Cooper 07/12/21.   Hypertension-compliant with amlodipine 5 mg, losartan-hydrochlorothiazide 100-25 mg  Follow up vascular, Dr. Wyn Quaker yesterday for carotid disease, CVA. She has stent placement surgery planned for right carotid 06/12/21.   She is compliant with aspirin 81mg ,liptor 40mg .  Changed from 325mg  to 81mg  after seeing Dr . He provided her with Plavix 75 QD and stated she should be on this for at least 5 days prior to her intervention.  Stroke likely from significant carotid disease.  Dr. commented that she would likely benefit from intervention regards to the right and left side.  The right side will be addressed first.  Patient admitted 06/01/2021 and discharged the following day principal problem of stroke. Echocardiogram showed normal ejection fraction.  MRI brain showing Multiple acute infarcts in the right thalamus and left parietal white matter; Mild-to-moderate chronic microvascular ischemic disease . CT angiogram demonstrated 7% or greater right ICA stenosis and 50 to 60% left ICA stenosis.  HISTORY:   Past Medical History:  Diagnosis Date   Anxiety    Hypercholesterolemia    Hypertension    Tobacco abuse    Past Surgical History:  Procedure Laterality Date   TONSILLECTOMY AND ADENOIDECTOMY Bilateral 1980   Family History  Problem Relation Age of Onset   Stroke Maternal Grandmother     Allergies: Povidone-iodine, Sulfamethoxazole-trimethoprim, Atenolol, Codeine, Erythromycin, Erythromycin, Olmesartan, Sulfa antibiotics, and Codeine Current Outpatient Medications on File Prior to Visit  Medication Sig Dispense Refill   amLODipine (NORVASC) 5 MG tablet TAKE 1 TABLET BY MOUTH EVERY DAY (Patient taking differently: Take 5 mg by mouth at bedtime.) 90 tablet 3   aspirin EC 81 MG tablet Take 81 mg by mouth daily. Swallow whole.     atorvastatin (LIPITOR) 40 MG tablet Take 1 tablet (40 mg total) by mouth daily. 30 tablet 0   cholecalciferol (VITAMIN D3) 25 MCG (1000 UNIT) tablet Take 1,000 Units by mouth daily.     citalopram (CELEXA) 20 MG tablet TAKE 1 TABLET BY MOUTH EVERY DAY 90 tablet 1   clopidogrel (PLAVIX) 75 MG tablet Take 75 mg by mouth daily.     fluticasone (FLONASE) 50 MCG/ACT nasal spray Place 2 sprays into both nostrils daily. (Patient taking differently: Place 2 sprays into both nostrils as needed.) 16 g 6   hydrocortisone-pramoxine (ANALPRAM HC) 2.5-1 % rectal cream Place 1 application rectally 2 (two) times daily as needed for hemorrhoids or anal itching. 30 g 0   losartan-hydrochlorothiazide (HYZAAR) 100-25 MG tablet TAKE 1 TABLET BY MOUTH EVERY DAY 90 tablet 3  vitamin B-12 (CYANOCOBALAMIN) 1000 MCG tablet Take 1,000 mcg by mouth daily.     No current facility-administered medications on file prior to visit.    Social History   Tobacco Use   Smoking status: Former    Types: Cigarettes    Quit date: 09/10/2012    Years since quitting: 8.7   Smokeless tobacco: Current  Substance Use Topics   Alcohol use: No    Alcohol/week: 0.0 standard drinks   Drug use: No     Review of Systems  Constitutional:  Negative for chills and fever.  HENT:  Positive for sore throat. Negative for congestion, ear discharge, ear pain, facial swelling, sinus pressure, trouble swallowing and voice change.   Eyes:  Negative for visual disturbance.  Respiratory:  Negative for cough.   Cardiovascular:  Negative for chest pain and palpitations.  Gastrointestinal:  Negative for nausea and vomiting.  Neurological:  Negative for dizziness and headaches.     Objective:    BP 121/72 (BP Location: Left Arm, Patient Position: Sitting, Cuff Size: Normal)   Pulse 66   Temp 98.5 F (36.9 C) (Oral)   Ht 5\' 2"  (1.575 m)   Wt 136 lb 9.6 oz (62 kg)   SpO2 98%   BMI 24.98 kg/m  BP Readings from Last 3 Encounters:  06/07/21 121/72  06/06/21 130/77  06/02/21 (!) 162/94   Wt Readings from Last 3 Encounters:  06/07/21 136 lb 9.6 oz (62 kg)  06/06/21 135 lb 12.8 oz (61.6 kg)  06/01/21 138 lb (62.6 kg)    Physical Exam Vitals reviewed.  Constitutional:      Appearance: She is well-developed.  HENT:     Head: Normocephalic and atraumatic.      Comments: Tenderness over left tonsillar area. No discrete mass palpated or asymmetry when compared to right side.     Right Ear: Hearing, tympanic membrane, ear canal and external ear normal. No decreased hearing noted. No drainage, swelling or tenderness. No middle ear effusion. No foreign body. Tympanic membrane is not erythematous or bulging.     Left Ear: Hearing, tympanic membrane, ear canal and external ear normal. No decreased hearing noted. No drainage, swelling or tenderness.  No middle ear effusion. No foreign body. Tympanic membrane is not erythematous or bulging.     Nose: Nose normal. No rhinorrhea.     Right Sinus: No maxillary sinus tenderness or frontal sinus tenderness.     Left Sinus: No maxillary sinus tenderness or frontal sinus tenderness.     Mouth/Throat:     Pharynx: Uvula midline. No oropharyngeal exudate or  posterior oropharyngeal erythema.     Tonsils: No tonsillar abscesses.  Eyes:     Conjunctiva/sclera: Conjunctivae normal.  Cardiovascular:     Rate and Rhythm: Regular rhythm.     Pulses: Normal pulses.     Heart sounds: Normal heart sounds.  Pulmonary:     Effort: Pulmonary effort is normal.     Breath sounds: Normal breath sounds. No wheezing, rhonchi or rales.  Lymphadenopathy:     Head:     Right side of head: No submental, submandibular, tonsillar, preauricular, posterior auricular or occipital adenopathy.     Left side of head: No submental, submandibular, tonsillar, preauricular, posterior auricular or occipital adenopathy.     Cervical: No cervical adenopathy.  Skin:    General: Skin is warm and dry.  Neurological:     Mental Status: She is alert.  Psychiatric:  Speech: Speech normal.        Behavior: Behavior normal.        Thought Content: Thought content normal.       Assessment & Plan:   Problem List Items Addressed This Visit       Cardiovascular and Mediastinum   Stroke [facility])    Reviewed hospitalization course with patient today.  Medications reconciled.  No sequela from recent CVA.  She is swallowing, speaking appropriately.  Upcoming stent placement right carotid.  Likely left carotid in the future.  She is compliant with aspirin 81 mg (previously been on 325 mg and reduced by Dr. Wyn Quaker).  She is also compliant with Plavix 75 mg.  She is upcoming appointment with Dr. Everlena Cooper November of this year.  She also has appointment scheduled with Dr. Sherryll Burger for December.  I advised that she may call both their offices and place her name on a cancellation list and likely can be seen sooner.  Advised her to call me with any concerns in scheduling a sooner appointment.      Relevant Medications   aspirin EC 81 MG tablet     Other   Neck pain    Well appearing today. Mild tenderness on exam.  Suspect recent ultrasound, or perhaps URI contributory.  No current URI  symptoms.  No discrete lymphadenopathy.  Offered CT head neck and patient politely declines.  She declines any further work-up at this time.  She is seeing ENT in 3 days and I have emphasized the importance of having ENT, Dr. Jenne Campus, also look at the area of concern.  She politely declines a strep test today as well.        I have discontinued Dayani B. Arnaud's aspirin. I am also having her maintain her fluticasone, hydrocortisone-pramoxine, citalopram, amLODipine, losartan-hydrochlorothiazide, vitamin B-12, cholecalciferol, atorvastatin, clopidogrel, and aspirin EC.   No orders of the defined types were placed in this encounter.   Return precautions given.   Risks, benefits, and alternatives of the medications and treatment plan prescribed today were discussed, and patient expressed understanding.   Education regarding symptom management and diagnosis given to patient on AVS.  Continue to follow with Dale Williamsdale, MD for routine health maintenance.   Loralee Pacas and I agreed with plan.   Rennie Plowman, FNP  I have spent 35 minutes with a patient including precharting, exam, reviewing medical records and hospitalization, and discussion plan of care.

## 2021-06-07 NOTE — Telephone Encounter (Signed)
FYI

## 2021-06-07 NOTE — Telephone Encounter (Signed)
April from Dr Driscilla Grammes office calling back in. States that according to the Patient's chart she is on 81 mg of Aspirin. States they did not change this medication when the Patient was seen recently.   States the Patient was placed on clopidogrel (PLAVIX) 75 MG tablet once daily. States this was the only medication change made.

## 2021-06-07 NOTE — Telephone Encounter (Signed)
Patient is schedule for right carotid stent with Dr Wyn Quaker on 06/12/21 arrival time 9:[facility]. Covid-19 testing is schedule for 06/08/21 between 8-12 pm at [facility]. Pre-procedure instructions were gone over with patient and will be mailed out.

## 2021-06-08 ENCOUNTER — Other Ambulatory Visit: Payer: Self-pay

## 2021-06-08 ENCOUNTER — Other Ambulatory Visit
Admission: RE | Admit: 2021-06-08 | Discharge: 2021-06-08 | Disposition: A | Discharge status: Home / Self Care | Payer: BC Managed Care – PPO | Source: Ambulatory Visit | Attending: Vascular Surgery | Admitting: Vascular Surgery

## 2021-06-08 DIAGNOSIS — Z01812 Encounter for preprocedural laboratory examination: Secondary | ICD-10-CM | POA: Insufficient documentation

## 2021-06-08 DIAGNOSIS — Z20822 Contact with and (suspected) exposure to covid-19: Secondary | ICD-10-CM | POA: Diagnosis not present

## 2021-06-08 NOTE — Assessment & Plan Note (Signed)
Reviewed hospitalization course with patient today.  Medications reconciled.  No sequela from recent CVA.  She is swallowing, speaking appropriately.  Upcoming stent placement right carotid.  Likely left carotid in the future.  She is compliant with aspirin 81 mg (previously been on 325 mg and reduced by Dr. Wyn Quaker).  She is also compliant with Plavix 75 mg.  She is upcoming appointment with Dr. Everlena Cooper November of this year.  She also has appointment scheduled with Dr. Sherryll Burger for December.  I advised that she may call both their offices and place her name on a cancellation list and likely can be seen sooner.  Advised her to call me with any concerns in scheduling a sooner appointment.

## 2021-06-09 LAB — SARS CORONAVIRUS 2 (TAT 6-24 HRS): SARS Coronavirus 2: NEGATIVE

## 2021-06-11 ENCOUNTER — Other Ambulatory Visit (INDEPENDENT_AMBULATORY_CARE_PROVIDER_SITE_OTHER): Payer: Self-pay | Admitting: Nurse Practitioner

## 2021-06-12 ENCOUNTER — Encounter
Admission: RE | Disposition: A | Discharge status: Home / Self Care | Payer: Self-pay | Source: Home / Self Care | Attending: Vascular Surgery

## 2021-06-12 ENCOUNTER — Encounter: Payer: Self-pay | Admitting: Vascular Surgery

## 2021-06-12 ENCOUNTER — Other Ambulatory Visit: Payer: Self-pay

## 2021-06-12 ENCOUNTER — Inpatient Hospital Stay
Admission: RE | Admit: 2021-06-12 | Discharge: 2021-06-13 | DRG: 034 | Disposition: A | Discharge status: Home / Self Care | Payer: BC Managed Care – PPO | Attending: Vascular Surgery | Admitting: Vascular Surgery

## 2021-06-12 DIAGNOSIS — Z79899 Other long term (current) drug therapy: Secondary | ICD-10-CM

## 2021-06-12 DIAGNOSIS — I10 Essential (primary) hypertension: Secondary | ICD-10-CM | POA: Diagnosis present

## 2021-06-12 DIAGNOSIS — Z8673 Personal history of transient ischemic attack (TIA), and cerebral infarction without residual deficits: Secondary | ICD-10-CM | POA: Diagnosis not present

## 2021-06-12 DIAGNOSIS — E7800 Pure hypercholesterolemia, unspecified: Secondary | ICD-10-CM | POA: Diagnosis present

## 2021-06-12 DIAGNOSIS — Z87891 Personal history of nicotine dependence: Secondary | ICD-10-CM | POA: Diagnosis not present

## 2021-06-12 DIAGNOSIS — F419 Anxiety disorder, unspecified: Secondary | ICD-10-CM | POA: Diagnosis present

## 2021-06-12 DIAGNOSIS — Z7982 Long term (current) use of aspirin: Secondary | ICD-10-CM | POA: Diagnosis not present

## 2021-06-12 DIAGNOSIS — Z885 Allergy status to narcotic agent status: Secondary | ICD-10-CM

## 2021-06-12 DIAGNOSIS — I6523 Occlusion and stenosis of bilateral carotid arteries: Secondary | ICD-10-CM | POA: Diagnosis present

## 2021-06-12 DIAGNOSIS — I7771 Dissection of carotid artery: Secondary | ICD-10-CM | POA: Diagnosis not present

## 2021-06-12 DIAGNOSIS — Z888 Allergy status to other drugs, medicaments and biological substances status: Secondary | ICD-10-CM | POA: Diagnosis not present

## 2021-06-12 DIAGNOSIS — I6521 Occlusion and stenosis of right carotid artery: Secondary | ICD-10-CM

## 2021-06-12 DIAGNOSIS — Z881 Allergy status to other antibiotic agents status: Secondary | ICD-10-CM | POA: Diagnosis not present

## 2021-06-12 DIAGNOSIS — I2542 Coronary artery dissection: Secondary | ICD-10-CM

## 2021-06-12 DIAGNOSIS — Z882 Allergy status to sulfonamides status: Secondary | ICD-10-CM

## 2021-06-12 DIAGNOSIS — Z823 Family history of stroke: Secondary | ICD-10-CM | POA: Diagnosis not present

## 2021-06-12 DIAGNOSIS — I63239 Cerebral infarction due to unspecified occlusion or stenosis of unspecified carotid arteries: Secondary | ICD-10-CM | POA: Diagnosis present

## 2021-06-12 HISTORY — PX: CAROTID PTA/STENT INTERVENTION: CATH118231

## 2021-06-12 LAB — CREATININE, SERUM
Creatinine, Ser: 0.82 mg/dL (ref 0.44–1.00)
GFR, Estimated: >60 mL/min (ref >60)

## 2021-06-12 LAB — GLUCOSE, CAPILLARY: Glucose-Capillary: 104 mg/dL — ABNORMAL HIGH (ref 70–99)

## 2021-06-12 LAB — POCT ACTIVATED CLOTTING TIME: Activated Clotting Time: 283 seconds

## 2021-06-12 LAB — MRSA NEXT GEN BY PCR, NASAL: MRSA by PCR Next Gen: NOT DETECTED

## 2021-06-12 LAB — BUN: BUN: 14 mg/dL (ref 8–23)

## 2021-06-12 SURGERY — CAROTID PTA/STENT INTERVENTION
Anesthesia: Moderate Sedation | Laterality: Right

## 2021-06-12 MED ORDER — ATORVASTATIN CALCIUM 20 MG PO TABS
40.0000 mg | ORAL_TABLET | Freq: Every day | ORAL | Status: DC
  Filled 2021-06-12 (×2): qty 2

## 2021-06-12 MED ORDER — CLOPIDOGREL BISULFATE 75 MG PO TABS
75.0000 mg | ORAL_TABLET | Freq: Every day | ORAL | Status: DC
  Administered 2021-06-13: 75 mg via ORAL
  Filled 2021-06-12 (×2): qty 1

## 2021-06-12 MED ORDER — PHENOL 1.4 % MT LIQD
1.0000 | OROMUCOSAL | Status: DC | PRN
  Filled 2021-06-12: qty 177

## 2021-06-12 MED ORDER — FAMOTIDINE IN NACL 20-0.9 MG/50ML-% IV SOLN
20.0000 mg | Freq: Two times a day (BID) | INTRAVENOUS | Status: DC
  Administered 2021-06-12 – 2021-06-13 (×2): 20 mg via INTRAVENOUS
  Filled 2021-06-12 (×3): qty 50

## 2021-06-12 MED ORDER — AMLODIPINE BESYLATE 5 MG PO TABS
5.0000 mg | ORAL_TABLET | Freq: Every day | ORAL | Status: DC
  Filled 2021-06-12: qty 1

## 2021-06-12 MED ORDER — LOSARTAN POTASSIUM-HCTZ 100-25 MG PO TABS: 1.0000 | ORAL_TABLET | Freq: Every day | ORAL | Status: DC

## 2021-06-12 MED ORDER — FENTANYL CITRATE (PF) 100 MCG/2ML IJ SOLN
INTRAMUSCULAR | Status: AC
  Filled 2021-06-12: qty 2

## 2021-06-12 MED ORDER — ACETAMINOPHEN 325 MG RE SUPP
325.0000 mg | RECTAL | Status: DC | PRN
  Filled 2021-06-12: qty 2

## 2021-06-12 MED ORDER — ONDANSETRON HCL 4 MG/2ML IJ SOLN: 4.0000 mg | Freq: Four times a day (QID) | INTRAMUSCULAR | Status: DC | PRN

## 2021-06-12 MED ORDER — CEFAZOLIN SODIUM-DEXTROSE 2-4 GM/100ML-% IV SOLN
2.0000 g | Freq: Three times a day (TID) | INTRAVENOUS | Status: AC
  Administered 2021-06-12 – 2021-06-13 (×2): 2 g via INTRAVENOUS
  Filled 2021-06-12 (×2): qty 100

## 2021-06-12 MED ORDER — HYDRALAZINE HCL 20 MG/ML IJ SOLN: 5.0000 mg | INTRAMUSCULAR | Status: DC | PRN

## 2021-06-12 MED ORDER — MORPHINE SULFATE (PF) 4 MG/ML IV SOLN: 2.0000 mg | INTRAVENOUS | Status: DC | PRN

## 2021-06-12 MED ORDER — CEFAZOLIN SODIUM-DEXTROSE 2-4 GM/100ML-% IV SOLN
INTRAVENOUS | Status: AC
  Administered 2021-06-12: 2 g via INTRAVENOUS
  Filled 2021-06-12: qty 100

## 2021-06-12 MED ORDER — HEPARIN SODIUM (PORCINE) 1000 UNIT/ML IJ SOLN
INTRAMUSCULAR | Status: AC
  Filled 2021-06-12: qty 1

## 2021-06-12 MED ORDER — FENTANYL CITRATE (PF) 100 MCG/2ML IJ SOLN
INTRAMUSCULAR | Status: DC | PRN
  Administered 2021-06-12 (×4): 25 ug via INTRAVENOUS

## 2021-06-12 MED ORDER — ATROPINE SULFATE 1 MG/10ML IJ SOSY
PREFILLED_SYRINGE | INTRAMUSCULAR | Status: AC
  Filled 2021-06-12: qty 10

## 2021-06-12 MED ORDER — NITROGLYCERIN 1 MG/10 ML FOR IR/CATH LAB
INTRA_ARTERIAL | Status: DC | PRN
  Administered 2021-06-12: 200 ug via INTRA_ARTERIAL

## 2021-06-12 MED ORDER — VITAMIN B-12 1000 MCG PO TABS
1000.0000 ug | ORAL_TABLET | Freq: Every day | ORAL | Status: DC
  Filled 2021-06-12 (×2): qty 1

## 2021-06-12 MED ORDER — ACETAMINOPHEN 325 MG PO TABS
325.0000 mg | ORAL_TABLET | ORAL | Status: DC | PRN
  Administered 2021-06-12 – 2021-06-13 (×3): 650 mg via ORAL
  Filled 2021-06-12 (×3): qty 2

## 2021-06-12 MED ORDER — ASPIRIN EC 81 MG PO TBEC
81.0000 mg | DELAYED_RELEASE_TABLET | Freq: Every day | ORAL | Status: DC
  Filled 2021-06-12 (×2): qty 1

## 2021-06-12 MED ORDER — MIDAZOLAM HCL 2 MG/2ML IJ SOLN
INTRAMUSCULAR | Status: AC
  Filled 2021-06-12: qty 2

## 2021-06-12 MED ORDER — VITAMIN D 25 MCG (1000 UNIT) PO TABS
1000.0000 [IU] | ORAL_TABLET | Freq: Every day | ORAL | Status: DC
  Filled 2021-06-12 (×4): qty 1

## 2021-06-12 MED ORDER — FENTANYL CITRATE PF 50 MCG/ML IJ SOSY
PREFILLED_SYRINGE | INTRAMUSCULAR | Status: AC
  Filled 2021-06-12: qty 1

## 2021-06-12 MED ORDER — ALUM & MAG HYDROXIDE-SIMETH 200-200-20 MG/5ML PO SUSP: 15.0000 mL | ORAL | Status: DC | PRN

## 2021-06-12 MED ORDER — MIDAZOLAM HCL 5 MG/5ML IJ SOLN
INTRAMUSCULAR | Status: AC
  Filled 2021-06-12: qty 5

## 2021-06-12 MED ORDER — GUAIFENESIN-DM 100-10 MG/5ML PO SYRP
15.0000 mL | ORAL_SOLUTION | ORAL | Status: DC | PRN
  Filled 2021-06-12: qty 15

## 2021-06-12 MED ORDER — SODIUM CHLORIDE 0.9 % IV SOLN: 500.0000 mL | Freq: Once | INTRAVENOUS | Status: DC | PRN

## 2021-06-12 MED ORDER — SODIUM CHLORIDE 0.9 % IV SOLN: INTRAVENOUS | Status: DC

## 2021-06-12 MED ORDER — OXYCODONE-ACETAMINOPHEN 5-325 MG PO TABS: 1.0000 | ORAL_TABLET | ORAL | Status: DC | PRN

## 2021-06-12 MED ORDER — FLUTICASONE PROPIONATE 50 MCG/ACT NA SUSP
2.0000 | Freq: Every day | NASAL | Status: DC | PRN
  Filled 2021-06-12: qty 16

## 2021-06-12 MED ORDER — MAGNESIUM SULFATE 2 GM/50ML IV SOLN
2.0000 g | Freq: Every day | INTRAVENOUS | Status: DC | PRN
  Filled 2021-06-12: qty 50

## 2021-06-12 MED ORDER — CHLORHEXIDINE GLUCONATE CLOTH 2 % EX PADS
6.0000 | MEDICATED_PAD | Freq: Every day | CUTANEOUS | Status: DC
  Administered 2021-06-13: 6 via TOPICAL

## 2021-06-12 MED ORDER — FAMOTIDINE 20 MG PO TABS: 40.0000 mg | ORAL_TABLET | Freq: Once | ORAL | Status: DC | PRN

## 2021-06-12 MED ORDER — FENTANYL CITRATE (PF) 100 MCG/2ML IJ SOLN: 12.5000 ug | Freq: Once | INTRAMUSCULAR | Status: DC | PRN

## 2021-06-12 MED ORDER — HEPARIN SODIUM (PORCINE) 1000 UNIT/ML IJ SOLN
INTRAMUSCULAR | Status: DC | PRN
  Administered 2021-06-12: 6000 [IU] via INTRAVENOUS

## 2021-06-12 MED ORDER — HYDROCHLOROTHIAZIDE 25 MG PO TABS: 25.0000 mg | ORAL_TABLET | Freq: Every day | ORAL | Status: DC

## 2021-06-12 MED ORDER — POTASSIUM CHLORIDE CRYS ER 20 MEQ PO TBCR: 20.0000 meq | EXTENDED_RELEASE_TABLET | Freq: Every day | ORAL | Status: DC | PRN

## 2021-06-12 MED ORDER — CITALOPRAM HYDROBROMIDE 20 MG PO TABS
20.0000 mg | ORAL_TABLET | Freq: Every day | ORAL | Status: DC
  Administered 2021-06-13: 20 mg via ORAL
  Filled 2021-06-12 (×2): qty 1

## 2021-06-12 MED ORDER — MIDAZOLAM HCL 2 MG/ML PO SYRP: 8.0000 mg | ORAL_SOLUTION | Freq: Once | ORAL | Status: DC | PRN

## 2021-06-12 MED ORDER — PHENYLEPHRINE 40 MCG/ML (10ML) SYRINGE FOR IV PUSH (FOR BLOOD PRESSURE SUPPORT)
PREFILLED_SYRINGE | INTRAVENOUS | Status: AC
  Filled 2021-06-12: qty 10

## 2021-06-12 MED ORDER — LOSARTAN POTASSIUM 50 MG PO TABS: 100.0000 mg | ORAL_TABLET | Freq: Every day | ORAL | Status: DC

## 2021-06-12 MED ORDER — NITROGLYCERIN 1 MG/10 ML FOR IR/CATH LAB
INTRA_ARTERIAL | Status: AC
  Filled 2021-06-12: qty 10

## 2021-06-12 MED ORDER — CEFAZOLIN SODIUM-DEXTROSE 2-4 GM/100ML-% IV SOLN: 2.0000 g | Freq: Once | INTRAVENOUS | Status: AC

## 2021-06-12 MED ORDER — METHYLPREDNISOLONE SODIUM SUCC 125 MG IJ SOLR: 125.0000 mg | Freq: Once | INTRAMUSCULAR | Status: DC | PRN

## 2021-06-12 MED ORDER — MIDAZOLAM HCL 2 MG/2ML IJ SOLN
INTRAMUSCULAR | Status: DC | PRN
  Administered 2021-06-12: 1 mg via INTRAVENOUS
  Administered 2021-06-12 (×2): .5 mg via INTRAVENOUS
  Administered 2021-06-12 (×2): 1 mg via INTRAVENOUS

## 2021-06-12 MED ORDER — DIPHENHYDRAMINE HCL 50 MG/ML IJ SOLN: 50.0000 mg | Freq: Once | INTRAMUSCULAR | Status: DC | PRN

## 2021-06-12 MED ORDER — PHENYLEPHRINE HCL-NACL 20-0.9 MG/250ML-% IV SOLN
INTRAVENOUS | Status: AC
  Filled 2021-06-12: qty 250

## 2021-06-12 MED ORDER — ATROPINE SULFATE 1 MG/ML IJ SOLN
INTRAMUSCULAR | Status: DC | PRN
  Administered 2021-06-12: .5 mg via INTRAVENOUS

## 2021-06-12 SURGICAL SUPPLY — 24 items
CATH ANGIO 5F PIGTAIL 100CM (CATHETERS) ×2 IMPLANT
CATH BEACON 5 .035 100 H1 TIP (CATHETERS) ×2 IMPLANT
COVER DRAPE FLUORO 36X44 (DRAPES) ×2 IMPLANT
COVER PROBE U/S 5X48 (MISCELLANEOUS) ×2 IMPLANT
DEVICE EMBOSHIELD NAV6 4.0-7.0 (FILTER) ×2 IMPLANT
DEVICE STARCLOSE SE CLOSURE (Vascular Products) ×2 IMPLANT
DEVICE TORQUE (MISCELLANEOUS) ×2 IMPLANT
GLIDEWIRE ADV .014X300CM (WIRE) ×2 IMPLANT
GLIDEWIRE ANGLED SS 035X260CM (WIRE) ×2 IMPLANT
GUIDEWIRE PFTE-COATED .018X300 (WIRE) ×2 IMPLANT
GUIDEWIRE VASC STIFF .038X260 (WIRE) ×2 IMPLANT
KIT CAROTID MANIFOLD (MISCELLANEOUS) ×2 IMPLANT
KIT ENCORE 26 ADVANTAGE (KITS) ×2 IMPLANT
PACK ANGIOGRAPHY (CUSTOM PROCEDURE TRAY) ×2 IMPLANT
SHEATH BRITE TIP 5FRX11 (SHEATH) ×2 IMPLANT
SHEATH GUIDING CAROTID 6FRX90 (SHEATH) ×2 IMPLANT
SHEATH PINNACLE ST 7F 65CM (SHEATH) ×2 IMPLANT
STENT VIABAHN 8X50X120 (Permanent Stent) ×2 IMPLANT
STENT VIABAHN5X120X8X (Permanent Stent) ×1 IMPLANT
STENT XACT CAR 9-7X30X136 (Permanent Stent) ×2 IMPLANT
SYR MEDRAD MARK 7 150ML (SYRINGE) ×2 IMPLANT
TUBING CONTRAST HIGH PRESS 72 (TUBING) ×2 IMPLANT
WIRE BAREWIRE WORK .014X315CM (WIRE) ×2 IMPLANT
WIRE GUIDERIGHT .035X150 (WIRE) ×2 IMPLANT

## 2021-06-12 NOTE — Progress Notes (Signed)
Informed Dr Wyn Quaker that pts PAD/puncture site is still oozing. Advised to add 5 ml of air on PAD. Will reassess later.

## 2021-06-12 NOTE — Progress Notes (Signed)
Dr. Wyn Quaker at bedside, speaking with pt. And her husband Loraine Leriche re: procedural results. Both verbalize understanding of conversation.

## 2021-06-12 NOTE — Interval H&P Note (Signed)
History and Physical Interval Note:  06/12/2021 10:08 AM  Kim Love  has presented today for surgery, with the diagnosis of RT Carotid Stent   ABBOTT    Carotid artery stenosis.  The various methods of treatment have been discussed with the patient and family. After consideration of risks, benefits and other options for treatment, the patient has consented to  Procedure(s): CAROTID PTA/STENT INTERVENTION (Right) as a surgical intervention.  The patient's history has been reviewed, patient examined, no change in status, stable for surgery.  I have reviewed the patient's chart and labs.  Questions were answered to the patient's satisfaction.     [name]

## 2021-06-12 NOTE — Op Note (Signed)
OPERATIVE NOTE DATE: 06/12/2021  PROCEDURE:  Ultrasound guidance for vascular access right femoral artery  Placement of a 9 mm proximal, 7 mm distal 3 cm long Exact stent in the right carotid artery Placement of an 8 mm x 5 cm Viabahn stent in the common carotid artery to treat dissection  PRE-OPERATIVE DIAGNOSIS: 1. High grade right carotid artery stenosis. 2. Recent stroke  POST-OPERATIVE DIAGNOSIS:  Same as above  SURGEON: [name], MD  ASSISTANT(S):  none  ANESTHESIA: local/MCS  ESTIMATED BLOOD LOSS:  10 cc  CONTRAST: 165 cc  FLUORO TIME: 16.2 minutes  MODERATE CONSCIOUS SEDATION TIME:  Approximately 78 minutes using 4 mg of Versed and 100 mcg of Fentanyl  FINDING(S): 1.   85-90% right carotid artery stenosis  SPECIMEN(S):   none  INDICATIONS:   Patient is a 65 y.o. female who presents with recent stroke and high grade right carotid artery stenosis.  The patient needs intervention to reduce the risk of recurrent stroke and we elected on carotid stenting.  Risks and benefits were discussed and informed consent was obtained.   DESCRIPTION: After obtaining full informed written consent, the patient was brought back to the vascular suite and placed supine upon the table.  The patient received IV antibiotics prior to induction. Moderate conscious sedation was administered during a face to face encounter with the patient throughout the procedure with my supervision of the RN administering medicines and monitoring the patients vital signs and mental status throughout from the start of the procedure until the patient was taken to the recovery room.  After obtaining adequate anesthesia, the patient was prepped and draped in the standard fashion.   The right femoral artery was visualized with ultrasound and found to be widely patent. It was then accessed under direct ultrasound guidance without difficulty with a Seldinger needle. A permanent image was recorded. A J-wire was placed  and we then placed a 6 French sheath. The patient was then heparinized and a total of 6000 units of intravenous heparin were given and an ACT was checked to confirm successful anticoagulation. A pigtail catheter was then placed into the ascending aorta. This showed a type II aortic arch with no proximal stenosis in the right vessels. I then selectively cannulated the innominate artery without difficulty with a headhunter catheter and advanced into the mid right common carotid artery.  Cervical and cerebral carotid angiography was then performed. There were no obvious intracranial filling defects with sluggish flow in the anterior cerebral artery but good flow in the middle cerebral artery. The carotid bifurcation demonstrated a high-grade stenosis in the 85 to 90% range at the origin of the internal carotid artery with some marked tortuosity distal to the lesion.  I then advanced into the external carotid artery with a Glidewire and the headhunter catheter and then exchanged for the Amplatz Super Stiff wire. Over the Amplatz Super Stiff wire, a 6 [place] shuttle sheath was placed into the mid common carotid artery. I then began by trying to advance the embolic protection device but the marked tortuosity distal to the lesion made this impossible to pass and gain good access, at this point, I considered aborting the procedure and converting to an open surgical repair, but the imaging noted that the common carotid artery showed significant narrowing and appeared to have dissection after sheath placement.  At this point, it was necessary to go ahead and treat the initial cervical carotid lesion as well as to address the dissection.  As  stated above, the embolic protection device was not really an option at this point but we did have to upsized to a 7 French sheath to be able to place a covered stent.  We did this over the Amplatz Super Stiff wire again.  Carefully navigating in the true lumen and avoiding the dissection,  a 0.014 advantage wire was then navigated through the lumen of the common carotid artery and then across the lesion in the internal carotid artery and then parked in the tortuous area of the distal internal carotid artery.  I began by placing a 9 mm proximal 7 mm distal 3 cm long tapered exact stent across the carotid bifurcation to address the native lesion.  This came back about a centimeter to a centimeter and a half into the common carotid artery essentially at the top of the dissection plane.  An 8 mm diameter by 5 cm length Viabahn stent was then deployed in the common carotid artery to address the dissection.  Both of these opened well and did not need to be postdilated reaching a good profile.  There was a less than 20% residual stenosis remaining in the native lesion and no significant narrowing in the common carotid artery after covered stent address the dissection.  Completion angiogram showed normal intracranial filling without new defects. At this point I elected to terminate the procedure. The sheath was removed and StarClose closure device was deployed in the right femoral artery with excellent hemostatic result. The patient was taken to the recovery room in stable condition having tolerated the procedure well.  COMPLICATIONS: none  CONDITION: stable  [name] 06/12/2021 12:36 PM   This note was created with Dragon Medical transcription system. Any errors in dictation are purely unintentional.

## 2021-06-13 DIAGNOSIS — I63239 Cerebral infarction due to unspecified occlusion or stenosis of unspecified carotid arteries: Secondary | ICD-10-CM

## 2021-06-13 LAB — BASIC METABOLIC PANEL
Anion gap: 3 — ABNORMAL LOW (ref 5–15)
BUN: 10 mg/dL (ref 8–23)
CO2: 29 mmol/L (ref 22–32)
Calcium: 7.8 mg/dL — ABNORMAL LOW (ref 8.9–10.3)
Chloride: 105 mmol/L (ref 98–111)
Creatinine, Ser: 0.82 mg/dL (ref 0.44–1.00)
GFR, Estimated: >60 mL/min (ref >60)
Glucose, Bld: 93 mg/dL (ref 70–99)
Potassium: 3.4 mmol/L — ABNORMAL LOW (ref 3.5–5.1)
Sodium: 137 mmol/L (ref 135–145)

## 2021-06-13 LAB — CBC
HCT: 34.1 % — ABNORMAL LOW (ref 36.0–46.0)
Hemoglobin: 11.2 g/dL — ABNORMAL LOW (ref 12.0–15.0)
MCH: 30.1 pg (ref 26.0–34.0)
MCHC: 32.8 g/dL (ref 30.0–36.0)
MCV: 91.7 fL (ref 80.0–100.0)
Platelets: 212 10*3/uL (ref 150–400)
RBC: 3.72 MIL/uL — ABNORMAL LOW (ref 3.87–5.11)
RDW: 13 % (ref 11.5–15.5)
WBC: 6.8 10*3/uL (ref 4.0–10.5)
nRBC: 0 % (ref 0.0–0.2)

## 2021-06-13 NOTE — Progress Notes (Signed)
Patient is alert and verbal. Resting in bed. Pulses felt in bilateral feet. PAD to right groin is in place and no new drainage is noted. Continues with 45cc. Up to bedside commode with assistance. Tolerated well. Afebrile. Call bell in reach. Will continue to monitor.

## 2021-06-13 NOTE — Discharge Summary (Signed)
[facility]    Discharge Summary  Patient ID:  Kim Love MRN: [id number] DOB/AGE: Nov 10, 1955 65 y.o.  Admit date: 06/12/2021 Discharge date: 06/13/2021 Date of Surgery: 06/12/2021 Surgeon: Surgeon(s): Wyn Quaker Marlow Baars, MD  Admission Diagnosis: Carotid stenosis, symptomatic, with infarction [facility]) [I63.239]  Discharge Diagnoses:  Carotid stenosis, symptomatic, with infarction [facility]) [I63.239]  Secondary Diagnoses: Past Medical History:  Diagnosis Date   Anxiety    Hypercholesterolemia    Hypertension    Tobacco abuse    Procedure(s): 06/12/21:  Ultrasound guidance for vascular access right femoral artery  Placement of a 9 mm proximal, 7 mm distal 3 cm long Exact stent in the right carotid artery Placement of an 8 mm x 5 cm Viabahn stent in the common carotid artery to treat dissection  Discharged Condition: Good  HPI / Hospital Course:  Patient is a 65 y.o. female who presents with recent stroke and high grade right carotid artery stenosis.  The patient needs intervention to reduce the risk of recurrent stroke and we elected on carotid stenting.  Risks and benefits were discussed and informed consent was obtained. On 06/12/21, the patient underwent:   Ultrasound guidance for vascular access right femoral artery  Placement of a 9 mm proximal, 7 mm distal 3 cm long Exact stent in the right carotid artery Placement of an 8 mm x 5 cm Viabahn stent in the common carotid artery to treat dissection  The patient tolerated the procedure and was transferred from the angiography suite to the ICU for observation overnight.  Patient's night of surgery was unremarkable.  During her brief stay, her diet was advanced, she was urinating independently, her discomfort was controlled with the use of p.o. pain medication and she was ambulating at baseline.  Day of discharge, patient was afebrile with stable vital signs and essentially unremarkable physical exam.  Labs: as  below  Complications: None  Consults: None  Significant Diagnostic Studies: CBC Lab Results  Component Value Date   WBC 6.8 06/13/2021   HGB 11.2 (L) 06/13/2021   HCT 34.1 (L) 06/13/2021   MCV 91.7 06/13/2021   PLT 212 06/13/2021   BMET    Component Value Date/Time   NA 137 06/13/2021 0605   NA 139 07/30/2012 1247   K 3.4 (L) 06/13/2021 0605   K 3.2 (L) 07/30/2012 1247   CL 105 06/13/2021 0605   CL 105 07/30/2012 1247   CO2 29 06/13/2021 0605   CO2 26 07/30/2012 1247   GLUCOSE 93 06/13/2021 0605   GLUCOSE 106 (H) 07/30/2012 1247   BUN 10 06/13/2021 0605   BUN 14 07/30/2012 1247   CREATININE 0.82 06/13/2021 0605   CREATININE 0.72 07/30/2012 1247   CALCIUM 7.8 (L) 06/13/2021 0605   CALCIUM 8.9 07/30/2012 1247   GFRNONAA >60 06/13/2021 0605   GFRNONAA >60 07/30/2012 1247   GFRAA >60 07/30/2012 1247   COAG Lab Results  Component Value Date   INR 1.0 06/01/2021   INR 1.0 05/28/2021   Disposition:  Discharge to :Home  Allergies as of 06/13/2021       Reactions   Povidone-iodine Swelling   Sulfamethoxazole-trimethoprim Nausea And Vomiting   Atenolol Other (See Comments)   Bradycardia   Codeine    REACTION: "gallbladder issues"   Erythromycin    REACTION: nausea   Erythromycin    Upset stomach   Olmesartan    Other reaction(s): Other (See Comments) Increased BP.   Sulfa Antibiotics    Codeine  Gallbladder issues        Medication List     TAKE these medications    amLODipine 5 MG tablet Commonly known as: NORVASC TAKE 1 TABLET BY MOUTH EVERY DAY What changed: when to take this   aspirin EC 81 MG tablet Take 81 mg by mouth daily. Swallow whole.   atorvastatin 40 MG tablet Commonly known as: LIPITOR Take 1 tablet (40 mg total) by mouth daily.   cholecalciferol 25 MCG (1000 UNIT) tablet Commonly known as: VITAMIN D3 Take 1,000 Units by mouth daily.   citalopram 20 MG tablet Commonly known as: CELEXA TAKE 1 TABLET BY MOUTH EVERY DAY    clopidogrel 75 MG tablet Commonly known as: PLAVIX Take 75 mg by mouth daily.   fluticasone 50 MCG/ACT nasal spray Commonly known as: FLONASE Place 2 sprays into both nostrils daily. What changed:  when to take this reasons to take this   hydrocortisone-pramoxine 2.5-1 % rectal cream Commonly known as: Analpram HC Place 1 application rectally 2 (two) times daily as needed for hemorrhoids or anal itching.   losartan-hydrochlorothiazide 100-25 MG tablet Commonly known as: HYZAAR TAKE 1 TABLET BY MOUTH EVERY DAY   vitamin B-12 1000 MCG tablet Commonly known as: CYANOCOBALAMIN Take 1,000 mcg by mouth daily.       Verbal and written Discharge instructions given to the patient. Wound care per Discharge AVS  Follow-up Information     Dew, Marlow Baars, MD Follow up in 1 month(s).   Specialties: Vascular Surgery, Radiology, Interventional Cardiology Why: Can see Dew or Vivia [place]. First post-op. Will need carotid with visit. Contact information: [address] [place] [address] [phone]         Annice Needy, MD. Schedule an appointment as soon as possible for a visit on 07/14/2021.   Specialties: Vascular Surgery, Radiology, Interventional Cardiology Why: FRIDAY NOV. 04, 2022 AT 0930AM DR Wyn Quaker Contact information: [address] [place] [address] [phone]                Signed: Tonette Lederer, PA-C 06/13/2021, 1:27 PM

## 2021-06-13 NOTE — Discharge Instructions (Addendum)
Vascular Surgery Discharge Instructions:  1) You may shower. Please keep your groins clean and dry. Gently clean with soap and water. Gently pat dry. 2) Please do not engage in strenuous activity or lifting more than 10 pounds until you are cleared at your first post-op visit.  3) Please do not drive for two weeks.

## 2021-06-15 ENCOUNTER — Telehealth: Payer: Self-pay

## 2021-06-15 NOTE — Telephone Encounter (Signed)
Transition Care Management Unsuccessful Follow-up Telephone Call  Date of discharge and from where:  06/13/2021 [facility]  Attempts:  1st Attempt  Reason for unsuccessful TCM follow-up call:  No answer/busy  Rowe Pavy, RN, BSN, CEN [facility] Coordinator [phone]

## 2021-06-19 ENCOUNTER — Telehealth (INDEPENDENT_AMBULATORY_CARE_PROVIDER_SITE_OTHER): Payer: Self-pay | Admitting: Vascular Surgery

## 2021-06-19 NOTE — Telephone Encounter (Signed)
Advise patient to follow discharge instructions and if she has any further questions she can follow up with the office

## 2021-06-20 ENCOUNTER — Telehealth: Payer: Self-pay

## 2021-06-20 NOTE — Telephone Encounter (Signed)
Transition Care Management Unsuccessful Follow-up Telephone Call  Date of discharge and from where:  06/13/2021  [facility]  Attempts:  2nd Attempt  Reason for unsuccessful TCM follow-up call:  No answer/busy Rowe Pavy, RN, BSN, CEN [facility] Coordinator [phone]

## 2021-06-30 ENCOUNTER — Telehealth: Payer: Self-pay | Admitting: Internal Medicine

## 2021-06-30 MED ORDER — ATORVASTATIN CALCIUM 40 MG PO TABS: 40.0000 mg | ORAL_TABLET | Freq: Every day | ORAL | 1 refills | Status: DC

## 2021-06-30 NOTE — Telephone Encounter (Signed)
Ok to refill? Last lipid was 06-02-21. Last bmp was 06-13-21.

## 2021-06-30 NOTE — Telephone Encounter (Signed)
Patient called in stating that she was prescribed atorvastatin 40 mg by an ER doctor and she is currently out and unsure who to get a new prescription from.Please advise.

## 2021-06-30 NOTE — Telephone Encounter (Signed)
Ok to refill cholesterol medication she is taking now.  Keep f/u appt.

## 2021-07-02 NOTE — Progress Notes (Addendum)
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 Hypertension Hyperlipidemia Tobacco use disorder  1  Check 30 day cardiac event monitor.  If unremarkable, will consider implantable loop recorder. 2.  Secondary stroke prevention as managed by PCP - From my standpoint, she may discontinue Plavix and just continue ASA 81mg  daily (unless vascular wants her to continue Plavix for full 90 days related to the stent) - atorvastatin.  LDL goal less than 70 - Normotensive blood pressure - Hgb A1c goal less than 7 3  Smoking cessation - has not smoked since the hospital 4  Mediterranean diet 5  Routine exercise (once cleared by vascular) 6  Follow up 6 months   Subjective:  Kim Love is a 65 year old female with HTN, hyperlipidemia and tobacco use who presents for stroke.  History supplemented by hospital records.  MRI of brain and CTA head and neck personally reviewed.   In September, she developed sudden onset double vision and feeling off balance.  Left eye did not seem to move.  She went to Urgent Care and was treated for ear infection.  She later saw her ophthalmologist who ordered an MRI of the brain, performed the following week on 06/01/2021 which revealed a small subacute infarct in the right medial thalamus and 2 punctate subacute infarcts in the left deep parietal lobe.  She was sent to [facility] where she was admitted.  CTA head and neck showed more than 70%  right ICA stenosis, 50-60% proximal left ICA stenosis.  Complete 2D echocardiogram showed EF 60-65% with no cardiac source of embolus.  LDL was 104 and Hgb A1c was 5.7.  She was not on antithrombotic therapy or statin prior to admission.  She was discharged on ASA 81mg , Plavix 75mg  and atorvastatin 40mg  daily.  She subsequently underwent outpatient right carotid stent on 06/12/2021.  She reports no residual symptoms.    Not on antithrombotic and atorvstatin  PAST MEDICAL HISTORY: Past Medical History:  Diagnosis Date   Anxiety    Hypercholesterolemia    Hypertension    Tobacco abuse     PAST SURGICAL HISTORY: Past Surgical History:  Procedure Laterality Date   CAROTID PTA/STENT INTERVENTION Right 06/12/2021   Procedure: CAROTID PTA/STENT INTERVENTION;  Surgeon: , MD;  Location: [facility] INVASIVE CV LAB;  Service: Cardiovascular;  Laterality: Right;   TONSILLECTOMY AND ADENOIDECTOMY Bilateral 1980    MEDICATIONS: Current Outpatient Medications on File Prior to Visit  Medication Sig Dispense Refill   amLODipine (NORVASC) 5 MG tablet TAKE 1 TABLET BY MOUTH EVERY DAY (Patient taking differently: Take 5 mg by mouth at bedtime.) 90 tablet 3   aspirin EC 81 MG tablet Take 81 mg by mouth daily. Swallow whole.     atorvastatin (LIPITOR) 40 MG tablet Take 1 tablet (40 mg total) by mouth daily. 90 tablet 1   cholecalciferol (VITAMIN D3) 25 MCG (1000 UNIT) tablet Take 1,000 Units by mouth daily.     citalopram (CELEXA) 20 MG tablet TAKE 1 TABLET BY MOUTH EVERY DAY 90 tablet 1  clopidogrel (PLAVIX) 75 MG tablet Take 75 mg by mouth daily.     fluticasone (FLONASE) 50 MCG/ACT nasal spray Place 2 sprays into both nostrils daily. (Patient taking differently: Place 2 sprays into both nostrils as needed.) 16 g 6   hydrocortisone-pramoxine (ANALPRAM HC) 2.5-1 % rectal cream Place 1 application rectally 2 (two) times daily as needed for hemorrhoids or anal itching. (Patient not taking: Reported on  06/12/2021) 30 g 0   losartan-hydrochlorothiazide (HYZAAR) 100-25 MG tablet TAKE 1 TABLET BY MOUTH EVERY DAY 90 tablet 3   vitamin B-12 (CYANOCOBALAMIN) 1000 MCG tablet Take 1,000 mcg by mouth daily.     No current facility-administered medications on file prior to visit.    ALLERGIES: Allergies  Allergen Reactions   Povidone-Iodine Swelling   Sulfamethoxazole-Trimethoprim Nausea And Vomiting   Atenolol Other (See Comments)    Bradycardia   Codeine     REACTION: "gallbladder issues"   Erythromycin     REACTION: nausea   Erythromycin     Upset stomach   Olmesartan     Other reaction(s): Other (See Comments) Increased BP.   Sulfa Antibiotics    Codeine     Gallbladder issues    FAMILY HISTORY: Family History  Problem Relation Age of Onset   Stroke Maternal Grandmother     Objective:  Blood pressure 119/69, pulse 89, height 5\' 2"  (1.575 m), weight 139 lb 6.4 oz (63.2 kg), SpO2 97 %. General: No acute distress.  Patient appears well-groomed.   Head:  Normocephalic/atraumatic Eyes:  fundi examined but not visualized Neck: supple, no paraspinal tenderness, full range of motion Back: No paraspinal tenderness Heart: regular rate and rhythm Lungs: Clear to auscultation bilaterally. Vascular: No carotid bruits. Neurological Exam: Mental status: alert and oriented to person, place, and time, recent and remote memory intact, fund of knowledge intact, attention and concentration intact, speech fluent and not dysarthric, language intact. Cranial nerves: CN I: not tested CN II: pupils equal, round and reactive to light, visual fields intact CN III, IV, VI:  full range of motion, no nystagmus, no ptosis CN V: facial sensation intact. CN VII: upper and lower face symmetric CN VIII: hearing intact CN IX, X: gag intact, uvula midline CN XI: sternocleidomastoid and trapezius muscles intact CN XII: tongue midline Bulk & Tone: normal, no fasciculations. Motor:  muscle strength 5/5  throughout Sensation:  Pinprick, temperature and vibratory sensation intact. Deep Tendon Reflexes:  2+ throughout,  toes downgoing.   Finger to nose testing:  Without dysmetria.   Heel to shin:  Without dysmetria.   Gait:  Normal station and stride.  Romberg negative.    Thank you for allowing me to take part in the care of this patient.  , DO  CC: Shon Millet, MD

## 2021-07-03 ENCOUNTER — Other Ambulatory Visit: Payer: Self-pay

## 2021-07-03 ENCOUNTER — Encounter: Payer: Self-pay | Admitting: Neurology

## 2021-07-03 ENCOUNTER — Ambulatory Visit (INDEPENDENT_AMBULATORY_CARE_PROVIDER_SITE_OTHER): Payer: BC Managed Care – PPO | Admitting: Neurology

## 2021-07-03 VITALS — BP 119/69 | HR 89 | Ht 62.0 in | Wt 139.4 lb

## 2021-07-03 DIAGNOSIS — I639 Cerebral infarction, unspecified: Secondary | ICD-10-CM

## 2021-07-03 DIAGNOSIS — Z720 Tobacco use: Secondary | ICD-10-CM

## 2021-07-03 DIAGNOSIS — E785 Hyperlipidemia, unspecified: Secondary | ICD-10-CM | POA: Diagnosis not present

## 2021-07-03 DIAGNOSIS — I10 Essential (primary) hypertension: Secondary | ICD-10-CM | POA: Diagnosis not present

## 2021-07-03 DIAGNOSIS — I6521 Occlusion and stenosis of right carotid artery: Secondary | ICD-10-CM

## 2021-07-03 NOTE — Patient Instructions (Signed)
If the vascular surgeon as no objection, you may discontinue clopidogrel now and just continue aspirin 81mg  daily.  Otherwise, remain on the clopidogrel for 90 days and then stop (just continue aspirin) Continue atorvastatin. Check 30 day cardiac event monitor Follow up in 6 months.

## 2021-07-11 ENCOUNTER — Ambulatory Visit: Payer: BC Managed Care – PPO | Admitting: Cardiovascular Disease

## 2021-07-11 DIAGNOSIS — I739 Peripheral vascular disease, unspecified: Secondary | ICD-10-CM | POA: Insufficient documentation

## 2021-07-11 DIAGNOSIS — I700 Atherosclerosis of aorta: Secondary | ICD-10-CM | POA: Insufficient documentation

## 2021-07-11 NOTE — Progress Notes (Deleted)
Cardiology Office Note  Date:  07/11/2021   ID:  DEVINN VOSHELL, DOB December 09, 1955, MRN [id number]  PCP:  Dale Clifton, MD   No chief complaint on file.   HPI:  [name] is a 65 year old woman with past medical history of Strokes,  PAD, carotid stenosis, prior stent to the right Hypertension Hyperlipidemia Smoker Who presents to establish care in the [facility] office, consultation for recent strokes  In September 2020,  developed double vision , off balance.  Left eye did not seem to move.    went to Urgent Care and was treated for ear infection.   saw her ophthalmologist  MRI of the brain, 06/01/2021 which revealed a small subacute infarct in the right medial thalamus and 2 punctate subacute infarcts in the left deep parietal lobe.    Seen in the hospital, [facility]  CTA head and neck showed more than 70% right ICA stenosis, 50-60% proximal left ICA stenosis.    Echo  EF 60-65% with no cardiac source of embolus.    Lab work reviewed LDL was 104 and Hgb A1c was 5.7.    discharged on ASA 81mg , Plavix 75mg  and atorvastatin 40mg  daily.    outpatient right carotid stent on 06/12/2021.    CT head neck, images pulled up and reviewed Carotid calcification noted Mild aortic atherosclerosis in the arch    PMH:   has a past medical history of Anxiety, Hypercholesterolemia, Hypertension, and Tobacco abuse.  PSH:    Past Surgical History:  Procedure Laterality Date   CAROTID PTA/STENT INTERVENTION Right 06/12/2021   Procedure: CAROTID PTA/STENT INTERVENTION;  Surgeon: , MD;  Location: [facility] INVASIVE CV LAB;  Service: Cardiovascular;  Laterality: Right;   TONSILLECTOMY AND ADENOIDECTOMY Bilateral 1980    Current Outpatient Medications  Medication Sig Dispense Refill   amLODipine (NORVASC) 5 MG tablet TAKE 1 TABLET BY MOUTH EVERY DAY (Patient taking differently: Take 5 mg by mouth at bedtime.) 90 tablet 3   aspirin EC 81 MG tablet Take 81 mg by mouth daily. Swallow  whole.     atorvastatin (LIPITOR) 40 MG tablet Take 1 tablet (40 mg total) by mouth daily. 90 tablet 1   cholecalciferol (VITAMIN D3) 25 MCG (1000 UNIT) tablet Take 1,000 Units by mouth daily.     citalopram (CELEXA) 20 MG tablet TAKE 1 TABLET BY MOUTH EVERY DAY 90 tablet 1   clopidogrel (PLAVIX) 75 MG tablet Take 75 mg by mouth daily.     fluticasone (FLONASE) 50 MCG/ACT nasal spray Place 2 sprays into both nostrils daily. (Patient taking differently: Place 2 sprays into both nostrils as needed.) 16 g 6   hydrocortisone-pramoxine (ANALPRAM HC) 2.5-1 % rectal cream Place 1 application rectally 2 (two) times daily as needed for hemorrhoids or anal itching. (Patient not taking: No sig reported) 30 g 0   losartan-hydrochlorothiazide (HYZAAR) 100-25 MG tablet TAKE 1 TABLET BY MOUTH EVERY DAY 90 tablet 3   vitamin B-12 (CYANOCOBALAMIN) 1000 MCG tablet Take 1,000 mcg by mouth daily.     No current facility-administered medications for this visit.     Allergies:   Povidone-iodine, Sulfamethoxazole-trimethoprim, Atenolol, Codeine, Erythromycin, Erythromycin, Olmesartan, Sulfa antibiotics, and Codeine   Social History:  The patient  reports that she quit smoking about 8 years ago. Her smoking use included cigarettes. She uses smokeless tobacco. She reports that she does not drink alcohol and does not use drugs.   Family History:   family history includes Stroke in her maternal grandfather  and maternal grandmother.    Review of Systems: ROS   PHYSICAL EXAM: VS:  There were no vitals taken for this visit. , BMI There is no height or weight on file to calculate BMI. GEN: Well nourished, well developed, in no acute distress HEENT: normal Neck: no JVD, carotid bruits, or masses Cardiac: RRR; no murmurs, rubs, or gallops,no edema  Respiratory:  clear to auscultation bilaterally, normal work of breathing GI: soft, nontender, nondistended, + BS MS: no deformity or atrophy Skin: warm and dry, no  rash Neuro:  Strength and sensation are intact Psych: euthymic mood, full affect    Recent Labs: 12/06/2020: TSH 1.41 06/01/2021: ALT 9 06/02/2021: Magnesium 2.2 06/13/2021: BUN 10; Creatinine, Ser 0.82; Hemoglobin 11.2; Platelets 212; Potassium 3.4; Sodium 137    Lipid Panel Lab Results  Component Value Date   CHOL 163 06/02/2021   HDL 39 (L) 06/02/2021   LDLCALC 104 (H) 06/02/2021   TRIG 101 06/02/2021      Wt Readings from Last 3 Encounters:  07/03/21 139 lb 6.4 oz (63.2 kg)  06/12/21 135 lb (61.2 kg)  06/07/21 136 lb 9.6 oz (62 kg)       ASSESSMENT AND PLAN:  Problem List Items Addressed This Visit   None    Disposition:   F/U  12 months   Total encounter time more than 25 minutes  Greater than 50% was spent in counseling and coordination of care with the patient    Signed, Dossie Arbour, M.D., Ph.D. [facility] [place], [place] [phone]

## 2021-07-12 ENCOUNTER — Ambulatory Visit: Payer: BC Managed Care – PPO | Admitting: Neurology

## 2021-07-12 ENCOUNTER — Other Ambulatory Visit (INDEPENDENT_AMBULATORY_CARE_PROVIDER_SITE_OTHER): Payer: Self-pay | Admitting: Vascular Surgery

## 2021-07-12 DIAGNOSIS — I6529 Occlusion and stenosis of unspecified carotid artery: Secondary | ICD-10-CM

## 2021-07-14 ENCOUNTER — Ambulatory Visit (INDEPENDENT_AMBULATORY_CARE_PROVIDER_SITE_OTHER): Payer: BC Managed Care – PPO | Admitting: Vascular Surgery

## 2021-07-14 ENCOUNTER — Other Ambulatory Visit: Payer: Self-pay

## 2021-07-14 ENCOUNTER — Ambulatory Visit (INDEPENDENT_AMBULATORY_CARE_PROVIDER_SITE_OTHER): Payer: BC Managed Care – PPO

## 2021-07-14 ENCOUNTER — Encounter (INDEPENDENT_AMBULATORY_CARE_PROVIDER_SITE_OTHER): Payer: Self-pay | Admitting: Vascular Surgery

## 2021-07-14 ENCOUNTER — Encounter (INDEPENDENT_AMBULATORY_CARE_PROVIDER_SITE_OTHER): Payer: BC Managed Care – PPO

## 2021-07-14 VITALS — BP 137/76 | HR 83 | Ht 62.0 in | Wt 139.0 lb

## 2021-07-14 DIAGNOSIS — I63239 Cerebral infarction due to unspecified occlusion or stenosis of unspecified carotid arteries: Secondary | ICD-10-CM

## 2021-07-14 DIAGNOSIS — I6529 Occlusion and stenosis of unspecified carotid artery: Secondary | ICD-10-CM | POA: Diagnosis not present

## 2021-07-14 IMAGING — MG MM DIGITAL DIAGNOSTIC UNILAT*L* W/ TOMO W/ CAD
4 series · 4 of 12 positions shown · non-contrast
Comparison: 01/14/2020 and earlier

CLINICAL DATA: Patient returns after screening study for evaluation
of possible LEFT breast mass.

EXAM:
DIGITAL DIAGNOSTIC LEFT MAMMOGRAM WITH CAD AND TOMO
ULTRASOUND LEFT BREAST

[L CC synth-2D]
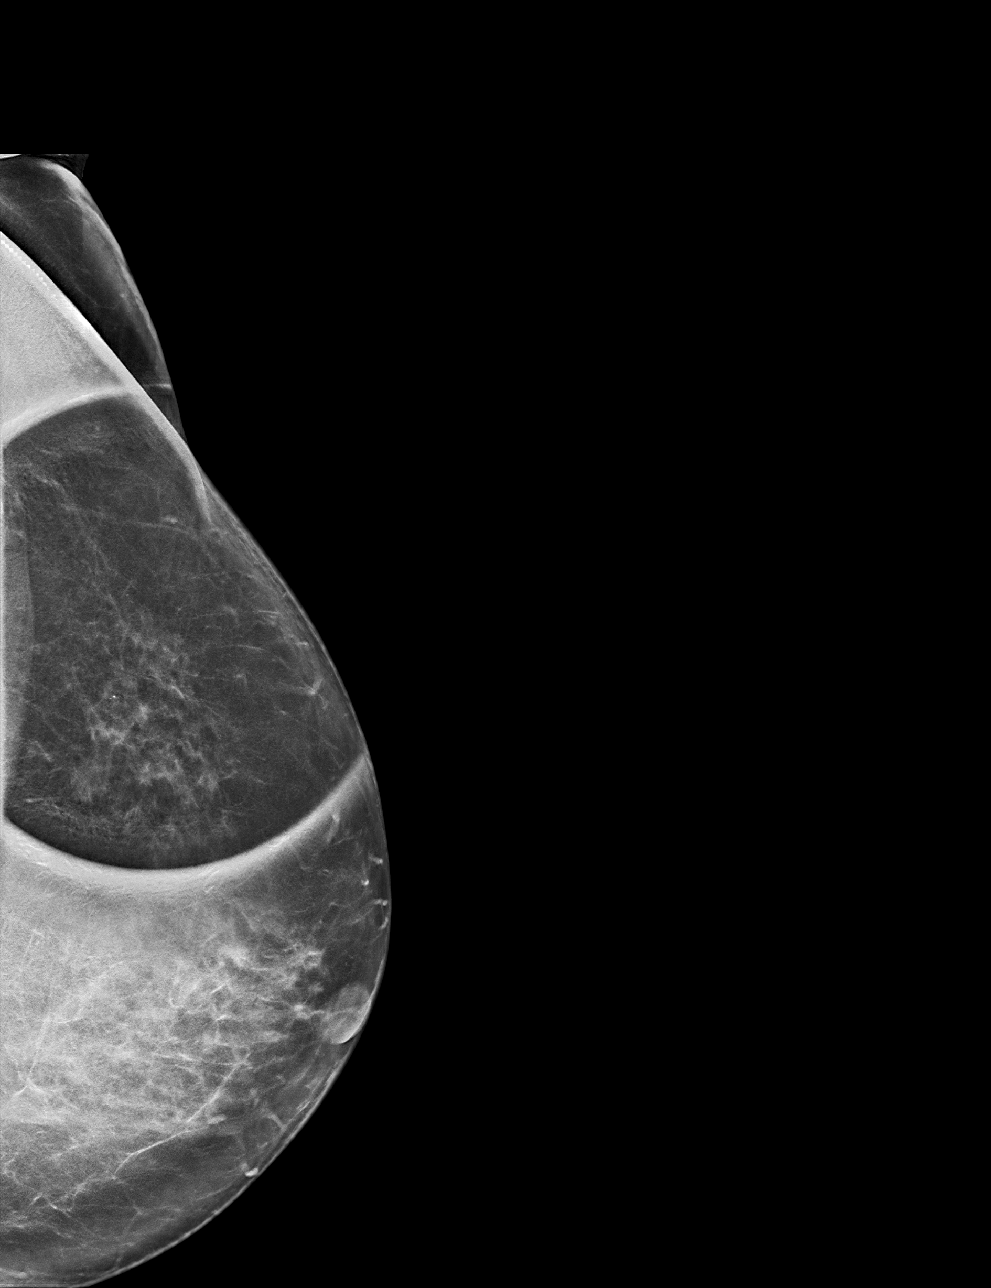

[L MLO synth-2D]
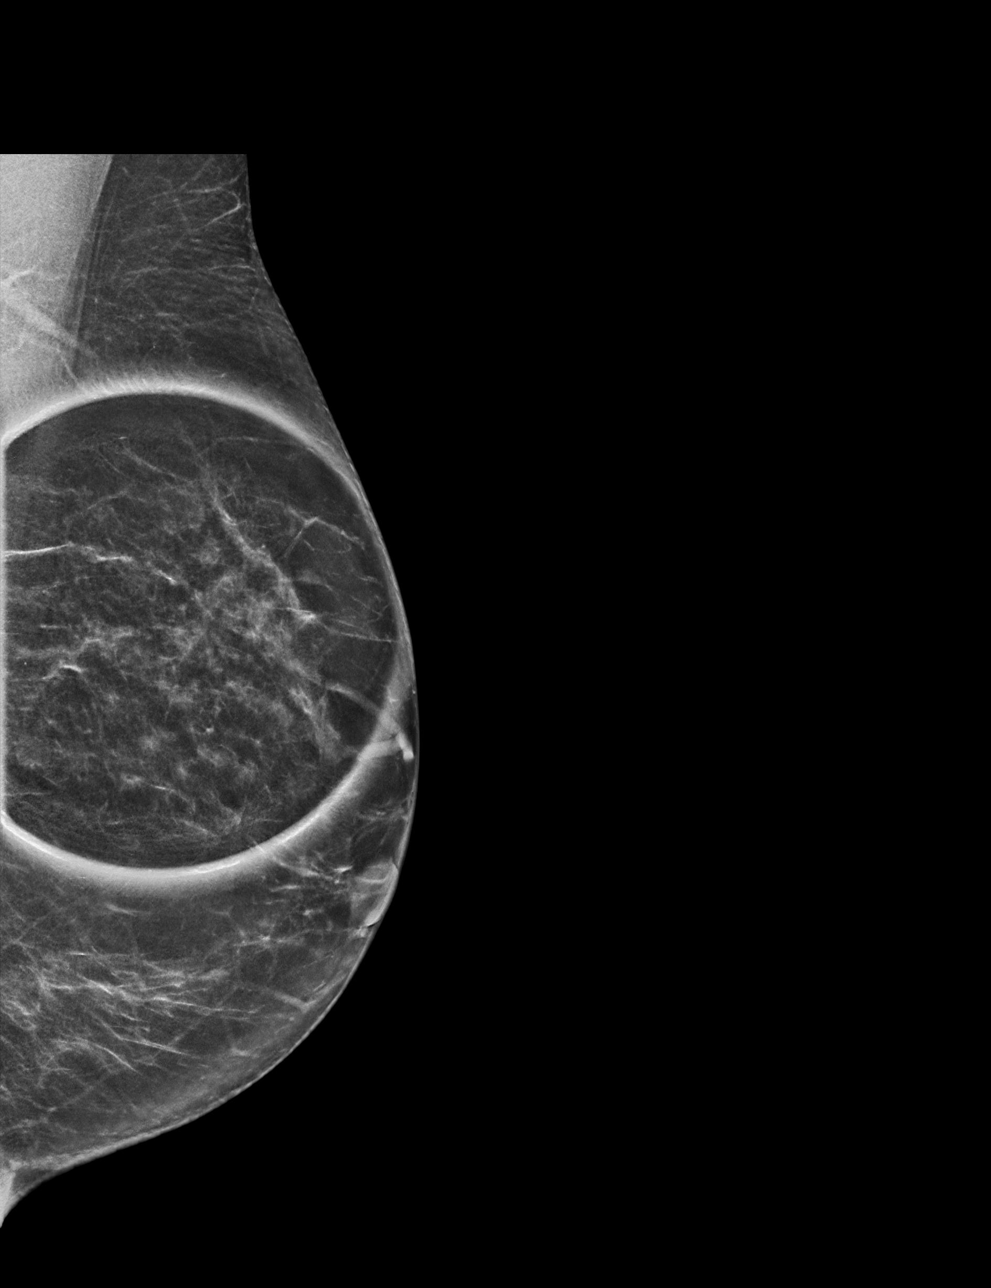

[L CC tomo · tomo slice 31/62.0]
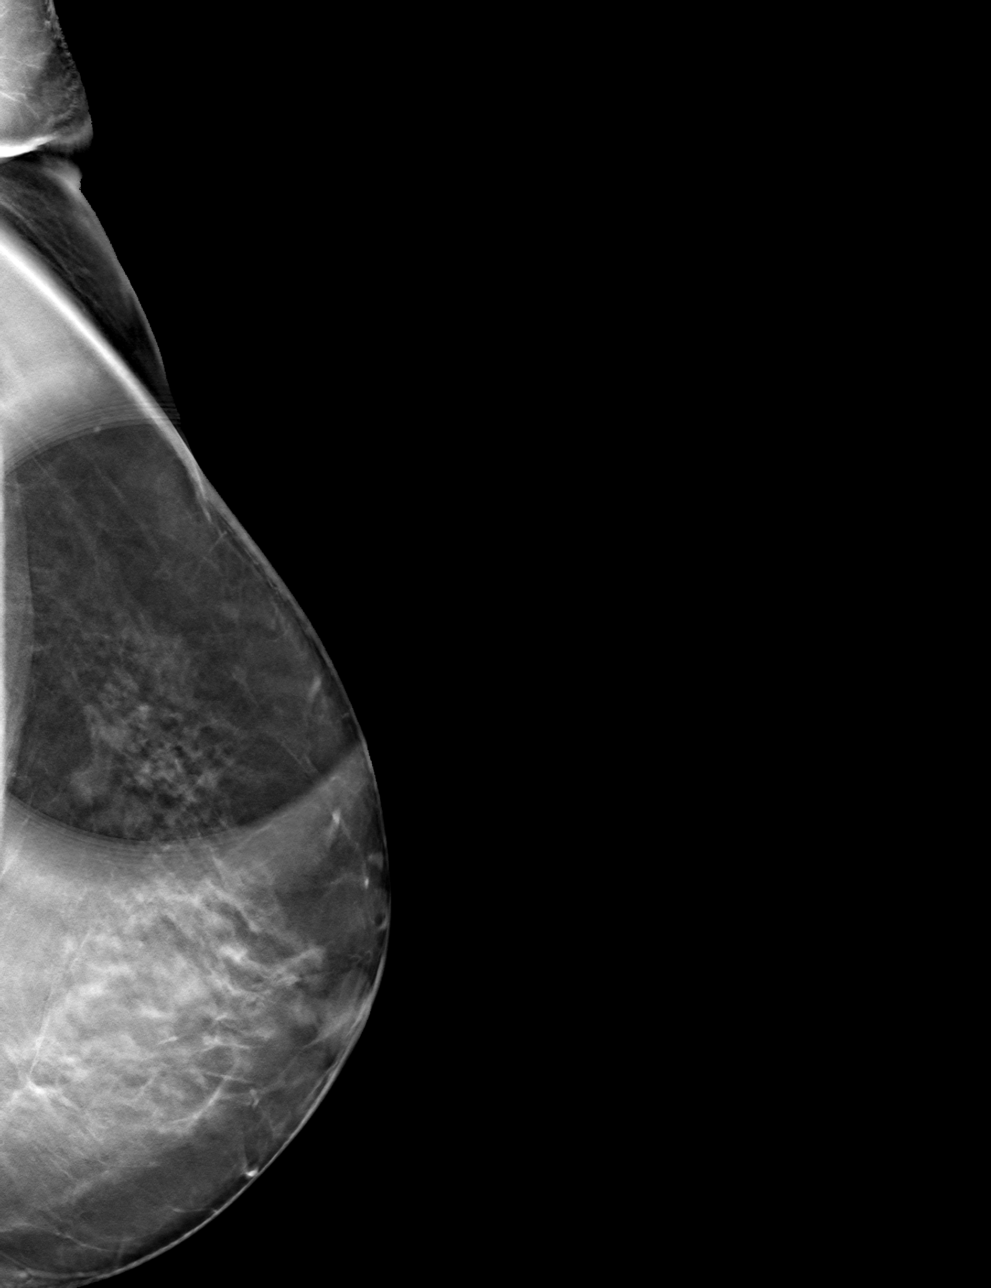

[L MLO tomo · tomo slice 31/60.0]
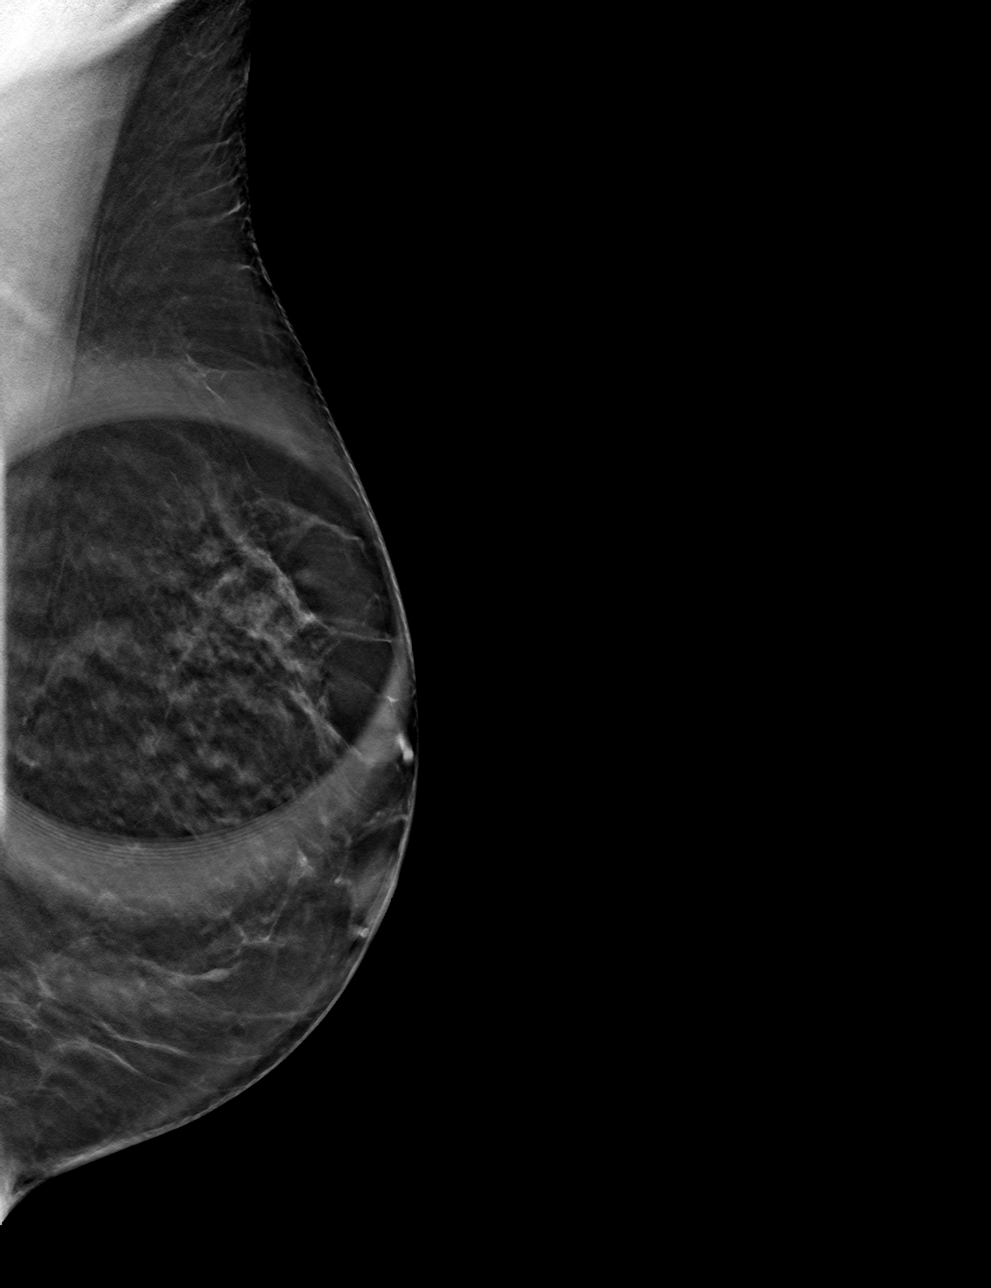

[4 of 12 positions shown; findings below may reference images not displayed]

ACR Breast Density Category b: There are scattered areas of
fibroglandular density.
FINDINGS: Additional 2-D and 3-D images are performed. These views confirm
presence of a circumscribed oval mass in the LATERAL aspect of the
LEFT breast, posterior depth.

Mammographic images were processed with CAD.

Targeted ultrasound is performed, showing a cyst with single
internal septation in the 3 o'clock location of the LEFT breast 3
centimeters from the nipple measuring 0.8 x 0.3 x 0.7 centimeters.
No solid component or internal blood flow.
IMPRESSION: Benign cyst in the LEFT breast. No mammographic or ultrasound
evidence for malignancy.

RECOMMENDATION:
Screening mammogram in one year.(Code:[id number])

I have discussed the findings and recommendations with the patient.
If applicable, a reminder letter will be sent to the patient
regarding the next appointment.

BI-RADS CATEGORY  2: Benign.

## 2021-07-14 NOTE — Assessment & Plan Note (Signed)
Carotid duplex today reveals the right carotid stent to be widely patent and there to be 1 to 39% left ICA stenosis.  She is doing well.  Continue aspirin, Plavix, and a statin agent.  She is due for colonoscopy as well as an eye procedure, but she will need to delay this at least 90 days postprocedure before I would be willing to hold the Plavix for 5 days.  I told her I will plan to keep the Plavix on for a year but we can at least do a short interval of holding it for procedures after 90 days.  I will see her back in 3 months with carotid duplex.

## 2021-07-14 NOTE — Progress Notes (Signed)
Patient ID: Kim Love, female   DOB: [birth year], 65 y.o.   MRN: [id number]  Chief Complaint  Patient presents with   Follow-up    1 mo [facility] post carotid stent carotid     HPI Kim Love is a 65 y.o. female.  Patient returns in follow-up about a month status post right carotid artery stent placement for high-grade stenosis and stroke prior to the procedure.  She had several small strokes and the etiology is not entirely clear, but the carotid disease was likely at least a contributor.  With the high-grade lesion, needed repaired.  Carotid stent was performed and she did well from this.  She was discharged home the following day.  She has had no periprocedural complications and is largely recovering well from her strokes.  Carotid duplex today reveals the right carotid stent to be widely patent and there to be 1 to 39% left ICA stenosis.   Past Medical History:  Diagnosis Date   Anxiety    Hypercholesterolemia    Hypertension    Tobacco abuse     Past Surgical History:  Procedure Laterality Date   CAROTID PTA/STENT INTERVENTION Right 06/12/2021   Procedure: CAROTID PTA/STENT INTERVENTION;  Surgeon: Annice Needy, MD;  Location: [facility] INVASIVE CV LAB;  Service: Cardiovascular;  Laterality: Right;   TONSILLECTOMY AND ADENOIDECTOMY Bilateral 1980      Allergies  Allergen Reactions   Povidone-Iodine Swelling   Sulfamethoxazole-Trimethoprim Nausea And Vomiting   Atenolol Other (See Comments)    Bradycardia   Codeine     REACTION: "gallbladder issues"   Erythromycin     REACTION: nausea   Erythromycin     Upset stomach   Olmesartan     Other reaction(s): Other (See Comments) Increased BP.   Sulfa Antibiotics    Codeine     Gallbladder issues    Current Outpatient Medications  Medication Sig Dispense Refill   amLODipine (NORVASC) 5 MG tablet TAKE 1 TABLET BY MOUTH EVERY DAY (Patient taking differently: Take 5 mg by mouth at bedtime.) 90 tablet 3   aspirin EC 81 MG  tablet Take 81 mg by mouth daily. Swallow whole.     atorvastatin (LIPITOR) 40 MG tablet Take 1 tablet (40 mg total) by mouth daily. 90 tablet 1   cholecalciferol (VITAMIN D3) 25 MCG (1000 UNIT) tablet Take 1,000 Units by mouth daily.     citalopram (CELEXA) 20 MG tablet TAKE 1 TABLET BY MOUTH EVERY DAY 90 tablet 1   clopidogrel (PLAVIX) 75 MG tablet Take 75 mg by mouth daily.     fluticasone (FLONASE) 50 MCG/ACT nasal spray Place 2 sprays into both nostrils daily. (Patient taking differently: Place 2 sprays into both nostrils as needed.) 16 g 6   hydrocortisone-pramoxine (ANALPRAM HC) 2.5-1 % rectal cream Place 1 application rectally 2 (two) times daily as needed for hemorrhoids or anal itching. 30 g 0   losartan-hydrochlorothiazide (HYZAAR) 100-25 MG tablet TAKE 1 TABLET BY MOUTH EVERY DAY 90 tablet 3   vitamin B-12 (CYANOCOBALAMIN) 1000 MCG tablet Take 1,000 mcg by mouth daily.     No current facility-administered medications for this visit.        Physical Exam BP 137/76   Pulse 83   Ht 5\' 2"  (1.575 m)   Wt 139 lb (63 kg)   BMI 25.42 kg/m  Gen:  WD/WN, NAD Skin: incision C/D/I     Assessment/Plan:  Carotid stenosis, symptomatic, with infarction (HCC) Carotid duplex today  reveals the right carotid stent to be widely patent and there to be 1 to 39% left ICA stenosis.  She is doing well.  Continue aspirin, Plavix, and a statin agent.  She is due for colonoscopy as well as an eye procedure, but she will need to delay this at least 90 days postprocedure before I would be willing to hold the Plavix for 5 days.  I told her I will plan to keep the Plavix on for a year but we can at least do a short interval of holding it for procedures after 90 days.  I will see her back in 3 months with carotid duplex.      [name] 07/14/2021, 9:40 AM   This note was created with Dragon medical transcription system.  Any errors from dictation are unintentional.

## 2021-07-20 ENCOUNTER — Encounter: Payer: BC Managed Care – PPO | Admitting: Internal Medicine

## 2021-08-08 ENCOUNTER — Other Ambulatory Visit: Payer: Self-pay

## 2021-08-08 ENCOUNTER — Encounter: Payer: Self-pay | Admitting: Cardiovascular Disease

## 2021-08-08 ENCOUNTER — Ambulatory Visit (INDEPENDENT_AMBULATORY_CARE_PROVIDER_SITE_OTHER): Payer: BC Managed Care – PPO | Admitting: Cardiovascular Disease

## 2021-08-08 ENCOUNTER — Ambulatory Visit (INDEPENDENT_AMBULATORY_CARE_PROVIDER_SITE_OTHER): Payer: BC Managed Care – PPO

## 2021-08-08 VITALS — BP 126/60 | HR 76 | Ht 62.0 in | Wt 140.4 lb

## 2021-08-08 DIAGNOSIS — I639 Cerebral infarction, unspecified: Secondary | ICD-10-CM | POA: Diagnosis not present

## 2021-08-08 DIAGNOSIS — I739 Peripheral vascular disease, unspecified: Secondary | ICD-10-CM | POA: Diagnosis not present

## 2021-08-08 DIAGNOSIS — I6523 Occlusion and stenosis of bilateral carotid arteries: Secondary | ICD-10-CM | POA: Diagnosis not present

## 2021-08-08 DIAGNOSIS — I700 Atherosclerosis of aorta: Secondary | ICD-10-CM | POA: Diagnosis not present

## 2021-08-08 DIAGNOSIS — E7800 Pure hypercholesterolemia, unspecified: Secondary | ICD-10-CM | POA: Diagnosis not present

## 2021-08-08 DIAGNOSIS — I10 Essential (primary) hypertension: Secondary | ICD-10-CM

## 2021-08-08 DIAGNOSIS — F17200 Nicotine dependence, unspecified, uncomplicated: Secondary | ICD-10-CM

## 2021-08-08 MED ORDER — POTASSIUM CHLORIDE ER 10 MEQ PO TBCR: 10.0000 meq | EXTENDED_RELEASE_TABLET | Freq: Every day | ORAL | 3 refills | Status: DC

## 2021-08-08 NOTE — Patient Instructions (Addendum)
Cholesterol check with PMD Goal LDL <70  Medication Instructions:  Please start potassium 10 mEq one tab daily  If you need a refill on your cardiac medications before your next appointment, please call your pharmacy.   Lab work: No new labs needed  Testing/Procedures: Heart monitor (Zio patch) for 2 weeks (14 days)  We will call with results   Your physician has recommended that you wear a Zio monitor. This monitor is a medical device that records the heart's electrical activity. Doctors most often use these monitors to diagnose arrhythmias. Arrhythmias are problems with the speed or rhythm of the heartbeat. The monitor is a small device applied to your chest. You can wear one while you do your normal daily activities. While wearing this monitor if you have any symptoms to push the button and record what you felt. Once you have worn this monitor for the period of time provider prescribed (Usually 14 days), you will return the monitor device in the postage paid box. Once it is returned they will download the data collected and provide [place] with a report which the provider will then review and we will call you with those results. Important tips:  Avoid showering during the first 24 hours of wearing the monitor. Avoid excessive sweating to help maximize wear time. Do not submerge the device, no hot tubs, and no swimming pools. Keep any lotions or oils away from the patch. After 24 hours you may shower with the patch on. Take brief showers with your back facing the shower head.  Do not remove patch once it has been placed because that will interrupt data and decrease adhesive wear time. Push the button when you have any symptoms and write down what you were feeling. Once you have completed wearing your monitor, remove and place into box which has postage paid and place in your outgoing mailbox.  If for some reason you have misplaced your box then call our office and we can provide another box  and/or mail it off for you.   Follow-Up: At [facility], you and your health needs are our priority.  As part of our continuing mission to provide you with exceptional heart care, we have created designated Provider Care Teams.  These Care Teams include your primary Cardiologist (physician) and Advanced Practice Providers (APPs -  Physician Assistants and Nurse Practitioners) who all work together to provide you with the care you need, when you need it.  You will need a follow up appointment in 12 months  Providers on your designated Care Team:   Nicolasa Ducking, NP Eula Listen, PA-C Cadence Fransico Michael, [place]  COVID-19 Vaccine Information can be found at: [email or web address] For questions related to vaccine distribution or appointments, please email [email or web address] or call [phone].

## 2021-08-08 NOTE — Progress Notes (Signed)
Cardiology Office Note  Date:  08/08/2021   ID:  Kim Love, DOB 03-11-1956, MRN [id number]  PCP:  Dale Lorraine, MD   Chief Complaint  Patient presents with   New Patient (Initial Visit)    Ref by Dr. Shon Millet for CVA.  Medications reviewed by the patient verbally. "Doing well."      HPI:  [name] is a 65 year old woman with past medical history of Strokes,  PAD, carotid stenosis, prior stent to the right Hypertension Hyperlipidemia Smoker Who presents to establish care in the [facility] office, referred by Dr. Everlena Cooper for consultation in the setting of recent stroke  In September 2022,  developed double vision , off balance.  Left eye did not seem to move.   went to Urgent Care and was treated for ear infection.  saw her ophthalmologist  MRI of the brain, 06/01/2021 which revealed a small subacute infarct in the right medial thalamus and 2 punctate subacute infarcts in the left deep parietal lobe.    Seen in the hospital, [facility]  CTA head and neck showed more than 70% right ICA stenosis, 50-60% proximal left ICA stenosis.    Echo  EF 60-65% with no cardiac source of embolus.    Lab work reviewed LDL was 104 and Hgb A1c was 5.7.    discharged on ASA 81mg , Plavix 75mg  and atorvastatin 40mg  daily.    outpatient right carotid stent on 06/12/2021.    Placement of a 9 mm proximal, 7 mm distal 3 cm long Exact stent in the right carotid artery Placement of an 8 mm x 5 cm Viabahn stent in the common carotid artery to treat dissection  CT head neck, images pulled up and reviewed Carotid calcification noted Mild aortic atherosclerosis in the arch  EKG personally reviewed by myself on todays visit NSR rate 76 nonspecific ST or T wave changes   PMH:   has a past medical history of Anxiety, Hypercholesterolemia, Hypertension, and Tobacco abuse.  PSH:    Past Surgical History:  Procedure Laterality Date   CAROTID PTA/STENT INTERVENTION Right 06/12/2021   Procedure:  CAROTID PTA/STENT INTERVENTION;  Surgeon: , MD;  Location: [facility] INVASIVE CV LAB;  Service: Cardiovascular;  Laterality: Right;   TONSILLECTOMY AND ADENOIDECTOMY Bilateral 1980    Current Outpatient Medications  Medication Sig Dispense Refill   amLODipine (NORVASC) 5 MG tablet TAKE 1 TABLET BY MOUTH EVERY DAY 90 tablet 3   aspirin EC 81 MG tablet Take 81 mg by mouth daily. Swallow whole.     atorvastatin (LIPITOR) 40 MG tablet Take 1 tablet (40 mg total) by mouth daily. 90 tablet 1   cholecalciferol (VITAMIN D3) 25 MCG (1000 UNIT) tablet Take 1,000 Units by mouth daily.     citalopram (CELEXA) 20 MG tablet TAKE 1 TABLET BY MOUTH EVERY DAY 90 tablet 1   clopidogrel (PLAVIX) 75 MG tablet Take 75 mg by mouth daily.     fluticasone (FLONASE) 50 MCG/ACT nasal spray Place 2 sprays into both nostrils daily. (Patient taking differently: Place 2 sprays into both nostrils as needed.) 16 g 6   hydrocortisone-pramoxine (ANALPRAM HC) 2.5-1 % rectal cream Place 1 application rectally 2 (two) times daily as needed for hemorrhoids or anal itching. 30 g 0   losartan-hydrochlorothiazide (HYZAAR) 100-25 MG tablet TAKE 1 TABLET BY MOUTH EVERY DAY 90 tablet 3   potassium chloride (KLOR-CON) 10 MEQ tablet Take 1 tablet (10 mEq total) by mouth daily. 90 tablet 3  vitamin B-12 (CYANOCOBALAMIN) 1000 MCG tablet Take 1,000 mcg by mouth daily.     No current facility-administered medications for this visit.     Allergies:   Povidone-iodine, Sulfamethoxazole-trimethoprim, Atenolol, Codeine, Erythromycin, Erythromycin, Olmesartan, Sulfa antibiotics, and Codeine   Social History:  The patient  reports that she has been smoking cigarettes. She has a 30.00 pack-year smoking history. She has never used smokeless tobacco. She reports that she does not drink alcohol and does not use drugs.   Family History:   family history includes Stroke in her maternal grandfather and maternal grandmother.    Review of  Systems: Review of Systems  Constitutional: Negative.   HENT: Negative.    Respiratory: Negative.    Cardiovascular: Negative.   Gastrointestinal: Negative.   Musculoskeletal: Negative.   Neurological: Negative.   Psychiatric/Behavioral: Negative.    All other systems reviewed and are negative.   PHYSICAL EXAM: VS:  BP 126/60 (BP Location: Right Arm, Patient Position: Sitting, Cuff Size: Normal)   Pulse 76   Ht 5\' 2"  (1.575 m)   Wt 140 lb 6 oz (63.7 kg)   SpO2 99%   BMI 25.67 kg/m  , BMI Body mass index is 25.67 kg/m. GEN: Well nourished, well developed, in no acute distress HEENT: normal Neck: no JVD, carotid bruits, or masses Cardiac: RRR; no murmurs, rubs, or gallops,no edema  Respiratory:  clear to auscultation bilaterally, normal work of breathing GI: soft, nontender, nondistended, + BS MS: no deformity or atrophy Skin: warm and dry, no rash Neuro:  Strength and sensation are intact Psych: euthymic mood, full affect   Recent Labs: 12/06/2020: TSH 1.41 06/01/2021: ALT 9 06/02/2021: Magnesium 2.2 06/13/2021: BUN 10; Creatinine, Ser 0.82; Hemoglobin 11.2; Platelets 212; Potassium 3.4; Sodium 137    Lipid Panel Lab Results  Component Value Date   CHOL 163 06/02/2021   HDL 39 (L) 06/02/2021   LDLCALC 104 (H) 06/02/2021   TRIG 101 06/02/2021      Wt Readings from Last 3 Encounters:  08/08/21 140 lb 6 oz (63.7 kg)  07/14/21 139 lb (63 kg)  07/03/21 139 lb 6.4 oz (63.2 kg)      ASSESSMENT AND PLAN:  Problem List Items Addressed This Visit       Cardiology Problems   PAD (peripheral artery disease) (HCC) - Primary   Relevant Orders   EKG 12-Lead   Aortic atherosclerosis (HCC)   Relevant Orders   EKG 12-Lead   Stroke (HCC)   Carotid stenosis   Relevant Orders   EKG 12-Lead   HTN (hypertension)   Hypercholesterolemia   Other Visit Diagnoses     Smoker          Aortic atherosclerosis Recent stroke Cholesterol above goal, will recommend Lipitor  40 and start Zetia 10 mg daily Goal LDL less than 70  History of stroke Recent carotid stent placement Lipid management as above, continue aspirin Plavix ZIO ordered for CVA, typically a 14-day monitor,  may need two monitors for 30 days if desired by neurology  Essential hypertension Blood pressure is well controlled on today's visit. No changes made to the medications. Potassium running low, need to supplement while on HCTZ Add potassium 10 mg daily  Smoking Trying to quit We have encouraged her to continue to work on weaning her cigarettes and smoking cessation. She will continue to work on this and does not want any assistance with chantix.      Total encounter time more than 60 minutes  Greater  than 50% was spent in counseling and coordination of care with the patient  Patient seen in consultation for Dr. Everlena Cooper will be referred back to his office for ongoing care of the issues detailed above  Signed, Dossie Arbour, M.D., Ph.D. [facility] [place], [place] [phone]

## 2021-08-10 ENCOUNTER — Ambulatory Visit (INDEPENDENT_AMBULATORY_CARE_PROVIDER_SITE_OTHER): Payer: BC Managed Care – PPO | Admitting: Internal Medicine

## 2021-08-10 ENCOUNTER — Other Ambulatory Visit: Payer: Self-pay

## 2021-08-10 VITALS — BP 126/70 | HR 72 | Temp 98.1°F | Resp 16 | Ht 62.0 in | Wt 139.4 lb

## 2021-08-10 DIAGNOSIS — I10 Essential (primary) hypertension: Secondary | ICD-10-CM | POA: Diagnosis not present

## 2021-08-10 DIAGNOSIS — E7800 Pure hypercholesterolemia, unspecified: Secondary | ICD-10-CM

## 2021-08-10 DIAGNOSIS — E559 Vitamin D deficiency, unspecified: Secondary | ICD-10-CM | POA: Diagnosis not present

## 2021-08-10 DIAGNOSIS — I700 Atherosclerosis of aorta: Secondary | ICD-10-CM

## 2021-08-10 DIAGNOSIS — Z0000 Encounter for general adult medical examination without abnormal findings: Secondary | ICD-10-CM

## 2021-08-10 DIAGNOSIS — I6523 Occlusion and stenosis of bilateral carotid arteries: Secondary | ICD-10-CM

## 2021-08-10 DIAGNOSIS — M25532 Pain in left wrist: Secondary | ICD-10-CM

## 2021-08-10 DIAGNOSIS — Z720 Tobacco use: Secondary | ICD-10-CM

## 2021-08-10 DIAGNOSIS — F419 Anxiety disorder, unspecified: Secondary | ICD-10-CM

## 2021-08-10 DIAGNOSIS — I739 Peripheral vascular disease, unspecified: Secondary | ICD-10-CM

## 2021-08-10 DIAGNOSIS — E876 Hypokalemia: Secondary | ICD-10-CM

## 2021-08-10 NOTE — Progress Notes (Signed)
Patient ID: Kim Love, female   DOB: 1956/05/18, 64 y.o.   MRN: [id number]   Subjective:    Patient ID: Kim Love, female    DOB: Jan 16, 1956, 65 y.o.   MRN: [id number]  This visit occurred during the SARS-CoV-2 public health emergency.  Safety protocols were in place, including screening questions prior to the visit, additional usage of staff PPE, and extensive cleaning of exam room while observing appropriate contact time as indicated for disinfecting solutions.   Patient here for her physical exam.  .   HPI Admitted 06/01/21 - 06/02/21 - after presenting with double vision and unsteadiness.  Evaluated by neurology.  MRI - brain - shoed acute infarcts in the right thalamus and left parietal white matter.  Treated with aspirin and lipitor.  Echo - normal EF, no source of thrombus.  CT angiogram of the neck and head showed a right ICA more than 70% stenosis.  Admitted 06/12/21 - 06/13/21 - intervention - s/p stent - right ICA.  Has done well since her hospitalization and intervention.  No recurring symptoms.  Trying to stay active. No chest pain, sob or increased heart rate or palpitations.  Just saw cardiology 08/08/21 - zio ordered.  On lipitor.  Recommended starting zetia.  Trying to quit smoking.  No cough or congestion.  No abdominal pain or bowel issues reported.  Did injure her left wrist - hit against a wall.  Is better.     Past Medical History:  Diagnosis Date   Anxiety    Hypercholesterolemia    Hypertension    Tobacco abuse    Past Surgical History:  Procedure Laterality Date   CAROTID PTA/STENT INTERVENTION Right 06/12/2021   Procedure: CAROTID PTA/STENT INTERVENTION;  Surgeon: Algernon Huxley, MD;  Location: [place] CV [facility];  Service: Cardiovascular;  Laterality: Right;   TONSILLECTOMY AND ADENOIDECTOMY Bilateral 1980   Family History  Problem Relation Age of Onset   Stroke Maternal Grandmother    Stroke Maternal Grandfather    Social History   Socioeconomic History    Marital status: Married    Spouse name: Doctor, general practice   Number of children: 1   Years of education: Not on file   Highest education level: Not on file  Occupational History    Employer: [employer]  Tobacco Use   Smoking status: Every Day    Packs/day: 1.00    Years: 30.00    Pack years: 30.00    Types: Cigarettes    Last attempt to quit: 09/10/2012    Years since quitting: 8.9   Smokeless tobacco: Never  Vaping Use   Vaping Use: Never used  Substance and Sexual Activity   Alcohol use: No    Comment: "once in a blue moon: I can't remember the last time."   Drug use: No   Sexual activity: Not on file  Other Topics Concern   Not on file  Social History Narrative   Lives at home with spouse       Right handed   Social Determinants of Health   Financial Resource Strain: Not on file  Food Insecurity: Not on file  Transportation Needs: Not on file  Physical Activity: Not on file  Stress: Not on file  Social Connections: Not on file     Review of Systems  Constitutional:  Negative for fatigue and unexpected weight change.  HENT:  Negative for congestion, sinus pressure and sore throat.   Eyes:  Negative for pain and visual  disturbance.  Respiratory:  Negative for cough, chest tightness and shortness of breath.   Cardiovascular:  Negative for chest pain, palpitations and leg swelling.  Gastrointestinal:  Negative for abdominal pain, diarrhea, nausea and vomiting.  Genitourinary:  Negative for difficulty urinating and dysuria.  Musculoskeletal:  Negative for joint swelling and myalgias.  Skin:  Negative for color change and rash.  Neurological:  Negative for dizziness, light-headedness and headaches.  Hematological:  Negative for adenopathy. Does not bruise/bleed easily.  Psychiatric/Behavioral:  Negative for agitation and dysphoric mood.       Objective:     BP 126/70   Pulse 72   Temp 98.1 F (36.7 C) (Oral)   Resp 16   Ht 5\' 2"  (1.575 m)   Wt 139 lb 6 oz (63.2 kg)    SpO2 97%   BMI 25.49 kg/m  Wt Readings from Last 3 Encounters:  08/10/21 139 lb 6 oz (63.2 kg)  08/08/21 140 lb 6 oz (63.7 kg)  07/14/21 139 lb (63 kg)    Physical Exam Vitals reviewed.  Constitutional:      General: She is not in acute distress.    Appearance: Normal appearance. She is well-developed.  HENT:     Head: Normocephalic and atraumatic.     Right Ear: External ear normal.     Left Ear: External ear normal.  Eyes:     General: No scleral icterus.       Right eye: No discharge.        Left eye: No discharge.     Conjunctiva/sclera: Conjunctivae normal.  Neck:     Thyroid: No thyromegaly.  Cardiovascular:     Rate and Rhythm: Normal rate and regular rhythm.  Pulmonary:     Effort: No tachypnea, accessory muscle usage or respiratory distress.     Breath sounds: Normal breath sounds. No decreased breath sounds or wheezing.  Chest:  Breasts:    Right: No inverted nipple, mass, nipple discharge or tenderness (no axillary adenopathy).     Left: No inverted nipple, mass, nipple discharge or tenderness (no axilarry adenopathy).  Abdominal:     General: Bowel sounds are normal.     Palpations: Abdomen is soft.     Tenderness: There is no abdominal tenderness.  Musculoskeletal:        General: No swelling or tenderness.     Cervical back: Neck supple.  Lymphadenopathy:     Cervical: No cervical adenopathy.  Skin:    Findings: No erythema or rash.  Neurological:     Mental Status: She is alert and oriented to person, place, and time.  Psychiatric:        Mood and Affect: Mood normal.        Behavior: Behavior normal.     Outpatient Encounter Medications as of 08/10/2021  Medication Sig   amLODipine (NORVASC) 5 MG tablet TAKE 1 TABLET BY MOUTH EVERY DAY   aspirin EC 81 MG tablet Take 81 mg by mouth daily. Swallow whole.   atorvastatin (LIPITOR) 40 MG tablet Take 1 tablet (40 mg total) by mouth daily.   cholecalciferol (VITAMIN D3) 25 MCG (1000 UNIT) tablet  Take 1,000 Units by mouth daily.   citalopram (CELEXA) 20 MG tablet TAKE 1 TABLET BY MOUTH EVERY DAY   clopidogrel (PLAVIX) 75 MG tablet Take 75 mg by mouth daily.   fluticasone (FLONASE) 50 MCG/ACT nasal spray Place 2 sprays into both nostrils daily. (Patient taking differently: Place 2 sprays into both nostrils as needed.)  hydrocortisone-pramoxine (ANALPRAM HC) 2.5-1 % rectal cream Place 1 application rectally 2 (two) times daily as needed for hemorrhoids or anal itching.   losartan-hydrochlorothiazide (HYZAAR) 100-25 MG tablet TAKE 1 TABLET BY MOUTH EVERY DAY   potassium chloride (KLOR-CON M) 10 MEQ tablet Take 10 mEq by mouth once.   potassium chloride (KLOR-CON) 10 MEQ tablet Take 1 tablet (10 mEq total) by mouth daily.   vitamin B-12 (CYANOCOBALAMIN) 1000 MCG tablet Take 1,000 mcg by mouth daily.   No facility-administered encounter medications on file as of 08/10/2021.     Lab Results  Component Value Date   WBC 6.8 06/13/2021   HGB 11.2 (L) 06/13/2021   HCT 34.1 (L) 06/13/2021   PLT 212 06/13/2021   GLUCOSE 93 06/13/2021   CHOL 163 06/02/2021   TRIG 101 06/02/2021   HDL 39 (L) 06/02/2021   LDLDIRECT 129.0 03/26/2018   LDLCALC 104 (H) 06/02/2021   ALT 9 06/01/2021   AST 17 06/01/2021   NA 137 06/13/2021   K 3.4 (L) 06/13/2021   CL 105 06/13/2021   CREATININE 0.82 06/13/2021   BUN 10 06/13/2021   CO2 29 06/13/2021   TSH 1.41 12/06/2020   INR 1.0 06/01/2021   HGBA1C 5.7 (H) 06/02/2021    PERIPHERAL VASCULAR CATHETERIZATION  Result Date: 06/12/2021 See surgical note for result.      Assessment & Plan:   Problem List Items Addressed This Visit     Anxiety    Continue citalopram.        Aortic atherosclerosis (HCC)    Continue atorvastatin.        Carotid stenosis    S/p intervention as outlined.  Continue risk factor modification.  Followed by [facility].       Essential hypertension, benign - Primary    On losartan/hctz and amlodipine.  Blood pressure as  outlined.  Follow pressures.  Follow metabolic panel.       Relevant Orders   Basic metabolic panel   Health care maintenance    Physical today 08/10/21.  Sees gyn.  States had mammogram through gyn.  Obtain records.  Colonoscopy 02/2015.  Will need to hold on f/u colonoscopy - given recent CVA and carotid intervention.        Hypercholesterolemia    On lipitor.  Low cholesterol diet and exercise.  Follow lipid panel and liver function tests.        Hypokalemia    Started on potassium supplements by cardiology. Follow potassium level.       Relevant Orders   Basic metabolic panel   PAD (peripheral artery disease) (HCC)    S/p stent (carotid) - followed by vascular surgery.  Continue risk factor modification.       Tobacco abuse    Trying to quit smoking.       Vitamin D deficiency   Relevant Orders   VITAMIN D 25 Hydroxy (Vit-D Deficiency, Fractures)   Wrist pain, left    Accidental injury - hit against wall. Has improved.  Wants to follow.          Dale [place], MD

## 2021-08-14 ENCOUNTER — Encounter: Payer: Self-pay | Admitting: Internal Medicine

## 2021-08-14 DIAGNOSIS — I639 Cerebral infarction, unspecified: Secondary | ICD-10-CM | POA: Diagnosis not present

## 2021-08-14 DIAGNOSIS — M25532 Pain in left wrist: Secondary | ICD-10-CM | POA: Insufficient documentation

## 2021-08-14 NOTE — Assessment & Plan Note (Signed)
S/p stent (carotid) - followed by vascular surgery.  Continue risk factor modification.  

## 2021-08-14 NOTE — Assessment & Plan Note (Signed)
S/p intervention as outlined.  Continue risk factor modification.  Followed by [facility].

## 2021-08-14 NOTE — Assessment & Plan Note (Signed)
Trying to quit smoking. 

## 2021-08-14 NOTE — Assessment & Plan Note (Signed)
Physical today 08/10/21.  Sees gyn.  States had mammogram through gyn.  Obtain records.  Colonoscopy 02/2015.  Will need to hold on f/u colonoscopy - given recent CVA and carotid intervention.

## 2021-08-14 NOTE — Assessment & Plan Note (Signed)
Accidental injury - hit against wall. Has improved.  Wants to follow.

## 2021-08-14 NOTE — Assessment & Plan Note (Signed)
Started on potassium supplements by cardiology. Follow potassium level.

## 2021-08-14 NOTE — Assessment & Plan Note (Signed)
On lipitor.  Low cholesterol diet and exercise.  Follow lipid panel and liver function tests.   

## 2021-08-14 NOTE — Assessment & Plan Note (Signed)
On losartan/hctz and amlodipine.  Blood pressure as outlined.  Follow pressures.  Follow metabolic panel.  

## 2021-08-14 NOTE — Assessment & Plan Note (Signed)
Continue atorvastatin

## 2021-08-14 NOTE — Assessment & Plan Note (Signed)
-  Continue citalopram 

## 2021-08-31 ENCOUNTER — Other Ambulatory Visit: Payer: Self-pay

## 2021-08-31 ENCOUNTER — Other Ambulatory Visit (INDEPENDENT_AMBULATORY_CARE_PROVIDER_SITE_OTHER): Payer: BC Managed Care – PPO

## 2021-08-31 DIAGNOSIS — E876 Hypokalemia: Secondary | ICD-10-CM

## 2021-08-31 DIAGNOSIS — E559 Vitamin D deficiency, unspecified: Secondary | ICD-10-CM | POA: Diagnosis not present

## 2021-08-31 DIAGNOSIS — I10 Essential (primary) hypertension: Secondary | ICD-10-CM | POA: Diagnosis not present

## 2021-08-31 LAB — BASIC METABOLIC PANEL
BUN: 14 mg/dL (ref 6–23)
CO2: 34 mEq/L — ABNORMAL HIGH (ref 19–32)
Calcium: 9 mg/dL (ref 8.4–10.5)
Chloride: 102 mEq/L (ref 96–112)
Creatinine, Ser: 0.79 mg/dL (ref 0.40–1.20)
GFR: 79.13 mL/min (ref >60.00)
Glucose, Bld: 94 mg/dL (ref 70–99)
Potassium: 3.5 mEq/L (ref 3.5–5.1)
Sodium: 141 mEq/L (ref 135–145)

## 2021-08-31 LAB — VITAMIN D 25 HYDROXY (VIT D DEFICIENCY, FRACTURES): VITD: 51.67 ng/mL (ref 30.00–100.00)

## 2021-09-01 ENCOUNTER — Other Ambulatory Visit (INDEPENDENT_AMBULATORY_CARE_PROVIDER_SITE_OTHER): Payer: Self-pay | Admitting: Vascular Surgery

## 2021-09-11 ENCOUNTER — Other Ambulatory Visit: Payer: Self-pay | Admitting: Internal Medicine

## 2021-09-18 ENCOUNTER — Telehealth: Payer: Self-pay

## 2021-09-18 NOTE — Telephone Encounter (Signed)
Was able to reach out to Kim Love husband Kim Love (DPR approved) via phone and make contact to review Mrs. Kim Love recent ZIO monitor results. Dr. Mariah Milling advised based on the current results   Zio monitor  No patient triggered events  Extra beats recorded, no significant arrhythmia   Kim Love verbalized understanding, is thankful for the results call, all questions and concerns were address. Will call back for anything further, f/u as schedule.   Results released to MyChart by Dr. Mariah Milling Seen by patient Kim Love on 09/18/2021  5:40 AM

## 2021-10-20 ENCOUNTER — Ambulatory Visit (INDEPENDENT_AMBULATORY_CARE_PROVIDER_SITE_OTHER): Payer: BC Managed Care – PPO

## 2021-10-20 ENCOUNTER — Ambulatory Visit (INDEPENDENT_AMBULATORY_CARE_PROVIDER_SITE_OTHER): Payer: BC Managed Care – PPO | Admitting: Vascular Surgery

## 2021-10-20 ENCOUNTER — Other Ambulatory Visit: Payer: Self-pay

## 2021-10-20 VITALS — BP 120/72 | HR 81 | Ht 62.0 in | Wt 141.0 lb

## 2021-10-20 DIAGNOSIS — E785 Hyperlipidemia, unspecified: Secondary | ICD-10-CM | POA: Diagnosis not present

## 2021-10-20 DIAGNOSIS — I63239 Cerebral infarction due to unspecified occlusion or stenosis of unspecified carotid arteries: Secondary | ICD-10-CM | POA: Diagnosis not present

## 2021-10-20 DIAGNOSIS — I10 Essential (primary) hypertension: Secondary | ICD-10-CM | POA: Diagnosis not present

## 2021-10-20 DIAGNOSIS — I639 Cerebral infarction, unspecified: Secondary | ICD-10-CM | POA: Diagnosis not present

## 2021-10-20 NOTE — Progress Notes (Signed)
MRN : [id number]  Kim Love is a 66 y.o. ([birth year]) female who presents with chief complaint of  Chief Complaint  Patient presents with   Follow-up    3 Mo Carotid  .  History of Present Illness: patient returns in follow up of her carotid disease.  She is almost 5 months s/p right carotid stent placement.  She is doing well.no complaints today.  Carotid duplex reveals a widely patent right carotid stent and 1-39% left carotid stenosis.  Current Outpatient Medications  Medication Sig Dispense Refill   amLODipine (NORVASC) 5 MG tablet TAKE 1 TABLET BY MOUTH EVERY DAY 90 tablet 3   aspirin EC 81 MG tablet Take 81 mg by mouth daily. Swallow whole.     atorvastatin (LIPITOR) 40 MG tablet Take 1 tablet (40 mg total) by mouth daily. 90 tablet 1   cholecalciferol (VITAMIN D3) 25 MCG (1000 UNIT) tablet Take 1,000 Units by mouth daily.     citalopram (CELEXA) 20 MG tablet TAKE 1 TABLET BY MOUTH EVERY DAY 90 tablet 1   clopidogrel (PLAVIX) 75 MG tablet TAKE 1 TABLET BY MOUTH DAILY. 90 tablet 1   fluticasone (FLONASE) 50 MCG/ACT nasal spray Place 2 sprays into both nostrils daily. (Patient taking differently: Place 2 sprays into both nostrils as needed.) 16 g 6   losartan-hydrochlorothiazide (HYZAAR) 100-25 MG tablet TAKE 1 TABLET BY MOUTH EVERY DAY 90 tablet 3   potassium chloride (KLOR-CON M) 10 MEQ tablet Take 10 mEq by mouth once.     vitamin B-12 (CYANOCOBALAMIN) 1000 MCG tablet Take 1,000 mcg by mouth daily.     hydrocortisone-pramoxine (ANALPRAM HC) 2.5-1 % rectal cream Place 1 application rectally 2 (two) times daily as needed for hemorrhoids or anal itching. (Patient not taking: Reported on 10/20/2021) 30 g 0   No current facility-administered medications for this visit.    Past Medical History:  Diagnosis Date   Anxiety    Hypercholesterolemia    Hypertension    Tobacco abuse     Past Surgical History:  Procedure Laterality Date   CAROTID PTA/STENT INTERVENTION Right  06/12/2021   Procedure: CAROTID PTA/STENT INTERVENTION;  Surgeon: Algernon Huxley, MD;  Location: [facility];  Service: Cardiovascular;  Laterality: Right;   TONSILLECTOMY AND ADENOIDECTOMY Bilateral 1980     Social History   Tobacco Use   Smoking status: Every Day    Packs/day: 1.00    Years: 30.00    Pack years: 30.00    Types: Cigarettes    Last attempt to quit: 09/10/2012    Years since quitting: 9.1   Smokeless tobacco: Never  Vaping Use   Vaping Use: Never used  Substance Use Topics   Alcohol use: No    Comment: "once in a blue moon: I can't remember the last time."   Drug use: No      Family History  Problem Relation Age of Onset   Stroke Maternal Grandmother    Stroke Maternal Grandfather      Allergies  Allergen Reactions   Povidone-Iodine Swelling   Sulfamethoxazole-Trimethoprim Nausea And Vomiting   Atenolol Other (See Comments)    Bradycardia   Codeine     REACTION: "gallbladder issues"   Erythromycin     REACTION: nausea   Erythromycin     Upset stomach   Olmesartan     Other reaction(s): Other (See Comments) Increased BP.   Sulfa Antibiotics    Codeine     Gallbladder issues  REVIEW OF SYSTEMS (Negative unless checked)   Constitutional: [] Weight loss  [] Fever  [] Chills Cardiac: [] Chest pain   [] Chest pressure   [] Palpitations   [] Shortness of breath when laying flat   [] Shortness of breath at rest   [] Shortness of breath with exertion. 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   [] Dysphagia   [] Weakness or numbness in arms   [] Weakness or numbness in legs Musculoskeletal:  [] Arthritis   [] Joint swelling   [] Joint pain   [] Low back pain Hematologic:  [] Easy bruising  [] Easy bleeding   [] Hypercoagulable state   [] Anemic   Gastrointestinal:  [] Blood in stool   [] Vomiting blood  [] Gastroesophageal reflux/heartburn   [] Abdominal pain Genitourinary:  [] Chronic kidney disease   [] Difficult urination  [] Frequent urination  [] Burning with urination   [] Hematuria Skin:  [] Rashes   [] Ulcers   [] Wounds Psychological:  [x] History of anxiety   []  History of major depression.  Physical Examination  Vitals:   10/20/21 0842  BP: 120/72  Pulse: 81  Weight: 141 lb (64 kg)  Height: 5\' 2"  (1.575 m)   Body mass index is 25.79 kg/m. Gen:  WD/WN, NAD Head: [place]/AT, No temporalis wasting. Ear/Nose/Throat: Hearing grossly intact, nares w/o erythema or drainage, trachea midline Eyes: Conjunctiva clear. Sclera non-icteric Neck: Supple.  No bruit  Pulmonary:  Good air movement, equal and clear to auscultation bilaterally.  Cardiac: RRR, No JVD Vascular:  Vessel Right Left  Radial Palpable Palpable           Musculoskeletal: M/S 5/5 throughout.  No deformity or atrophy. No edema. Neurologic: CN 2-12 intact. Sensation grossly intact in extremities.  Symmetrical.  Speech is fluent. Motor exam as listed above. Psychiatric: Judgment intact, Mood & affect appropriate for pt's clinical situation. Dermatologic: No rashes or ulcers noted.  No cellulitis or open wounds.     CBC Lab Results  Component Value Date   WBC 6.8 06/13/2021   HGB 11.2 (L) 06/13/2021   HCT 34.1 (L) 06/13/2021   MCV 91.7 06/13/2021   PLT 212 06/13/2021    BMET    Component Value Date/Time   NA 141 08/31/2021 1015   NA 139 07/30/2012 1247   K 3.5 08/31/2021 1015   K 3.2 (L) 07/30/2012 1247   CL 102 08/31/2021 1015   CL 105 07/30/2012 1247   CO2 34 (H) 08/31/2021 1015   CO2 26  07/30/2012 1247   GLUCOSE 94 08/31/2021 1015   GLUCOSE 106 (H) 07/30/2012 1247   BUN 14 08/31/2021 1015   BUN 14 07/30/2012 1247   CREATININE 0.79 08/31/2021 1015   CREATININE 0.72 07/30/2012 1247   CALCIUM 9.0 08/31/2021 1015   CALCIUM 8.9 07/30/2012 1247   GFRNONAA >60 06/13/2021 0605   GFRNONAA >60 07/30/2012 1247   GFRAA >60 07/30/2012 1247   CrCl cannot be calculated (Patient's most recent lab result is older than the  maximum 21 days allowed.).  COAG Lab Results  Component Value Date   INR 1.0 06/01/2021   INR 1.0 05/28/2021    Radiology No results found.   Assessment/Plan Essential hypertension, benign blood pressure control important in reducing the progression of atherosclerotic disease. On appropriate oral medications.     HLD (hyperlipidemia) lipid control important in reducing the progression of atherosclerotic disease. Continue statin therapy  Carotid stenosis, symptomatic, with infarction (HCC) Carotid duplex reveals a widely patent right carotid stent and 1-39% left carotid stenosis. Doing well on ASA/Plavix and a statin agent.  Recheck in 6 months.     Leotis Pain, MD  10/20/2021 10:22 AM    This note was created with Dragon medical transcription system.  Any errors from dictation are purely unintentional

## 2021-10-20 NOTE — Assessment & Plan Note (Signed)
Carotid duplex reveals a widely patent right carotid stent and 1-39% left carotid stenosis. Doing well on ASA/Plavix and a statin agent.  Recheck in 6 months.

## 2021-11-16 ENCOUNTER — Encounter: Payer: Self-pay | Admitting: Adult Health

## 2021-11-16 ENCOUNTER — Telehealth (INDEPENDENT_AMBULATORY_CARE_PROVIDER_SITE_OTHER): Payer: BC Managed Care – PPO | Admitting: Adult Health

## 2021-11-16 DIAGNOSIS — J069 Acute upper respiratory infection, unspecified: Secondary | ICD-10-CM

## 2021-11-16 DIAGNOSIS — R0981 Nasal congestion: Secondary | ICD-10-CM | POA: Diagnosis not present

## 2021-11-16 NOTE — Progress Notes (Signed)
I, Kim Ben, FNP, attempted to connect with Kim Love; MRN [id number] on 11/16/21 via [employer] to complete a video urgent care visit. The patient was unable to successfully connect to the video platform. As such, the patient was contacted by this provider via phone to complete the encounter.  ? ? ?Virtual Visit via Telephone Note ? ?I connected with Kim Love on 11/16/21 at 10:00 AM EST by telephone and verified that I am speaking with the correct person using two identifiers. ? ?Location: ?Patient: at home  ?Provider: Provider: Provider's office at  [facility], [place]. ?  ? ?  ?I discussed the limitations, risks, security and privacy concerns of performing an evaluation and management service by telephone and the availability of in person appointments. I also discussed with the patient that there may be a patient responsible charge related to this service. The patient expressed understanding and agreed to proceed. ? ?Chief Complaint:  ?F/u - pt c/o low grade fever . Fever measure 100 this morning. Feels stuffy, slight cough, fatigue. Pt granddaughter sick. Has been caring for her. ?History of Present Illness: ?She reports she started with nasal stuffiness, congestion started this morning. She did mow yesterday- did wear a mask. Temperature this morning was 100. Now temp  is 98.6 without medication/ fever reducers.denies any productive cough. Denies any pneumonia or bronchitis. Denies any ear pain.  ?Denies any sore throat. Denies any body aches.  ? ?She was exposed to her granddaughter who had fever last week and cough, sneezing, congestion. Denies any strep. Granddaughter is 21 years old.  ?  ?Patient  denies any fever, body aches,chills, rash, chest pain, shortness of breath, nausea, vomiting, or diarrhea.  ?Denies dizziness, lightheadedness, pre syncopal or syncopal episodes.  ? ? ?has Essential hypertension, benign; Bradycardia; Hypercholesterolemia;  Anxiety; Vitamin D deficiency; Health care maintenance; Sleeping difficulties; Dermatitis due to plants, including poison ivy, sumac, and oak; Fatigue; B12 deficiency; Sinusitis; Right shoulder pain; Tick bite of groin; Hypokalemia; Stroke [facility]); Depression with anxiety; HLD (hyperlipidemia); HTN (hypertension); Tobacco abuse; Carotid stenosis; Neck pain; Carotid stenosis, symptomatic, with infarction [facility]); PAD (peripheral artery disease) (HCC); Aortic atherosclerosis (HCC); and Wrist pain, left on their problem list.  ? ?Observations/Objective: ? ? ?Patient is alert and oriented and responsive to questions Engages in conversation with provider. Speaks in full sentences without any pauses without any shortness of breath or distress.   ? ?Assessment and Plan: ? ?1. Viral upper respiratory tract infection ?Day 1 of symptoms today, temperature 100 this am. No fever.  ?Feels well besides nasal congestion. She is not taking any medication currently. ?Was exposed to 65 year old grandchild unknown illness so will test for strep and respiratory panel on Friday 11/17/21. ? ?Advise to try nasal saline, Mucinex per package instructions, rest and hydration. If fever persists over 3 days or any worsening symptom  be seen immediately. ? ?Advised ?- COVID-19, Flu A+B and RSV; Future ?- Culture, Group A Strep; Future ? ?2. Nasal congestion ? ?- COVID-19, Flu A+B and RSV; Future ?- Culture, Group A Strep; Future  ? ?Follow Up Instructions: ? ? Return in about 4 days (around 11/20/2021), or if symptoms worsen or fail to improve, for at any time for any worsening symptoms, Go to Emergency room/ urgent care if worse.  ? ?Advised in person evaluation at anytime is advised if any symptoms do not improve, worsen or change at any given time.  ?Red Flags discussed. The patient was  given clear instructions to go to ER or return to medical center if any red flags develop, symptoms do not improve, worsen or new problems develop. They verbalized  understanding.  ?I discussed the assessment and treatment plan with the patient. The patient was provided an opportunity to ask questions and all were answered. The patient agreed with the plan and demonstrated an understanding of the instructions. ?  ?The patient was advised to call back or seek an in-person evaluation if the symptoms worsen or if the condition fails to improve as anticipated. ? ?Time spent 22 minutes. ?Kim Ben, FNP  ?

## 2021-11-16 NOTE — Progress Notes (Signed)
Pt requests to call tomorrow AM if she feels like she needs to come ?

## 2021-11-17 ENCOUNTER — Telehealth: Payer: Self-pay | Admitting: Internal Medicine

## 2021-11-17 NOTE — Telephone Encounter (Signed)
Patient called and wanted office to know she no longer has a temp and is feeling better. She does not want any testing done. Patient was seen by virtual with [place] on 11/16/2021. ?

## 2021-12-12 ENCOUNTER — Encounter: Payer: Self-pay | Admitting: Internal Medicine

## 2021-12-12 ENCOUNTER — Ambulatory Visit: Payer: BC Managed Care – PPO | Admitting: Internal Medicine

## 2021-12-12 VITALS — BP 124/70 | HR 80 | Temp 97.6°F | Resp 12 | Ht 62.0 in | Wt 140.0 lb

## 2021-12-12 DIAGNOSIS — E559 Vitamin D deficiency, unspecified: Secondary | ICD-10-CM | POA: Diagnosis not present

## 2021-12-12 DIAGNOSIS — E7800 Pure hypercholesterolemia, unspecified: Secondary | ICD-10-CM

## 2021-12-12 DIAGNOSIS — E785 Hyperlipidemia, unspecified: Secondary | ICD-10-CM | POA: Diagnosis not present

## 2021-12-12 DIAGNOSIS — I739 Peripheral vascular disease, unspecified: Secondary | ICD-10-CM

## 2021-12-12 DIAGNOSIS — I10 Essential (primary) hypertension: Secondary | ICD-10-CM | POA: Diagnosis not present

## 2021-12-12 DIAGNOSIS — I6523 Occlusion and stenosis of bilateral carotid arteries: Secondary | ICD-10-CM

## 2021-12-12 DIAGNOSIS — E538 Deficiency of other specified B group vitamins: Secondary | ICD-10-CM

## 2021-12-12 DIAGNOSIS — F419 Anxiety disorder, unspecified: Secondary | ICD-10-CM

## 2021-12-12 DIAGNOSIS — E876 Hypokalemia: Secondary | ICD-10-CM

## 2021-12-12 DIAGNOSIS — Z1231 Encounter for screening mammogram for malignant neoplasm of breast: Secondary | ICD-10-CM

## 2021-12-12 DIAGNOSIS — I700 Atherosclerosis of aorta: Secondary | ICD-10-CM

## 2021-12-12 LAB — CBC WITH DIFFERENTIAL/PLATELET
Basophils Absolute: 0.1 10*3/uL (ref 0.0–0.1)
Basophils Relative: 1.5 % (ref 0.0–3.0)
Eosinophils Absolute: 0.3 10*3/uL (ref 0.0–0.7)
Eosinophils Relative: 4.4 % (ref 0.0–5.0)
HCT: 40.6 % (ref 36.0–46.0)
Hemoglobin: 13.9 g/dL (ref 12.0–15.0)
Lymphocytes Relative: 24.1 % (ref 12.0–46.0)
Lymphs Abs: 1.5 10*3/uL (ref 0.7–4.0)
MCHC: 34.1 g/dL (ref 30.0–36.0)
MCV: 89.6 fl (ref 78.0–100.0)
Monocytes Absolute: 0.4 10*3/uL (ref 0.1–1.0)
Monocytes Relative: 6 % (ref 3.0–12.0)
Neutro Abs: 4 10*3/uL (ref 1.4–7.7)
Neutrophils Relative %: 64 % (ref 43.0–77.0)
Platelets: 259 10*3/uL (ref 150.0–400.0)
RBC: 4.53 Mil/uL (ref 3.87–5.11)
RDW: 14.1 % (ref 11.5–15.5)
WBC: 6.2 10*3/uL (ref 4.0–10.5)

## 2021-12-12 LAB — HEPATIC FUNCTION PANEL
ALT: 13 U/L (ref 0–35)
AST: 16 U/L (ref 0–37)
Albumin: 4.1 g/dL (ref 3.5–5.2)
Alkaline Phosphatase: 92 U/L (ref 39–117)
Bilirubin, Direct: 0.1 mg/dL (ref 0.0–0.3)
Total Bilirubin: 0.7 mg/dL (ref 0.2–1.2)
Total Protein: 6.5 g/dL (ref 6.0–8.3)

## 2021-12-12 LAB — TSH: TSH: 1.03 u[IU]/mL (ref 0.35–5.50)

## 2021-12-12 LAB — BASIC METABOLIC PANEL
BUN: 11 mg/dL (ref 6–23)
CO2: 30 mEq/L (ref 19–32)
Calcium: 9.1 mg/dL (ref 8.4–10.5)
Chloride: 102 mEq/L (ref 96–112)
Creatinine, Ser: 0.74 mg/dL (ref 0.40–1.20)
GFR: 85.42 mL/min (ref >60.00)
Glucose, Bld: 86 mg/dL (ref 70–99)
Potassium: 3.5 mEq/L (ref 3.5–5.1)
Sodium: 139 mEq/L (ref 135–145)

## 2021-12-12 LAB — LIPID PANEL
Cholesterol: 129 mg/dL (ref 0–200)
HDL: 39.3 mg/dL (ref >39.00)
LDL Cholesterol: 72 mg/dL (ref 0–99)
NonHDL: 90.01
Total CHOL/HDL Ratio: 3
Triglycerides: 91 mg/dL (ref 0.0–149.0)
VLDL: 18.2 mg/dL (ref 0.0–40.0)

## 2021-12-12 LAB — VITAMIN B12: Vitamin B-12: 1057 pg/mL — ABNORMAL HIGH (ref 211–911)

## 2021-12-12 NOTE — Progress Notes (Signed)
Patient ID: Kim Love, female   DOB: 11/21/1955, 66 y.o.   MRN: [id number] ? ? ?Subjective:  ? ? Patient ID: Kim Love, female    DOB: 11/02/1955, 66 y.o.   MRN: [id number] ? ?This visit occurred during the SARS-CoV-2 public health emergency.  Safety protocols were in place, including screening questions prior to the visit, additional usage of staff PPE, and extensive cleaning of exam room while observing appropriate contact time as indicated for disinfecting solutions.  ? ?Patient here for a scheduled follow up.   ? ?HPI ?Follow up regarding her blood pressure and cholesterol.  Stays active.  No chest pain or sob reported.  No acid reflux.  No abdominal pain.  Bowels moving.  Blood pressure doing well.  Increased stress.  Overall feels she is handling things relatively well.  Continue cilalopram.  Saw Dr Lucky Cowboy - 10/20/21 - widely patent right carotid stent and 1-39% left carotid stenosis.  Continue aspirin and plavix and statin.  Recommended f/u in 6 months.  ? ? ?Past Medical History:  ?Diagnosis Date  ? Anxiety   ? Hypercholesterolemia   ? Hypertension   ? Tobacco abuse   ? ?Past Surgical History:  ?Procedure Laterality Date  ? CAROTID PTA/STENT INTERVENTION Right 06/12/2021  ? Procedure: CAROTID PTA/STENT INTERVENTION;  Surgeon: Algernon Huxley, MD;  Location: [facility];  Service: Cardiovascular;  Laterality: Right;  ? TONSILLECTOMY AND ADENOIDECTOMY Bilateral 1980  ? ?Family History  ?Problem Relation Age of Onset  ? Stroke Maternal Grandmother   ? Stroke Maternal Grandfather   ? ?Social History  ? ?Socioeconomic History  ? Marital status: Married  ?  Spouse name: Elta Guadeloupe  ? Number of children: 1  ? Years of education: Not on file  ? Highest education level: Not on file  ?Occupational History  ?  Employer: [employer]  ?Tobacco Use  ? Smoking status: Every Day  ?  Packs/day: 1.00  ?  Years: 30.00  ?  Pack years: 30.00  ?  Types: Cigarettes  ?  Last attempt to quit: 09/10/2012  ?  Years since quitting: 9.2   ? Smokeless tobacco: Never  ?Vaping Use  ? Vaping Use: Never used  ?Substance and Sexual Activity  ? Alcohol use: No  ?  Comment: "once in a blue moon: I can't remember the last time."  ? Drug use: No  ? Sexual activity: Not on file  ?Other Topics Concern  ? Not on file  ?Social History Narrative  ? Lives at home with spouse   ?   ? Right handed  ? ?Social Determinants of Health  ? ?Financial Resource Strain: Not on file  ?Food Insecurity: Not on file  ?Transportation Needs: Not on file  ?Physical Activity: Not on file  ?Stress: Not on file  ?Social Connections: Not on file  ? ? ? ?Review of Systems  ?Constitutional:  Negative for appetite change and unexpected weight change.  ?HENT:  Negative for congestion and sinus pressure.   ?Respiratory:  Negative for cough, chest tightness and shortness of breath.   ?Cardiovascular:  Negative for chest pain, palpitations and leg swelling.  ?Gastrointestinal:  Negative for abdominal pain, diarrhea, nausea and vomiting.  ?Genitourinary:  Negative for difficulty urinating and dysuria.  ?Musculoskeletal:  Negative for joint swelling and myalgias.  ?Skin:  Negative for color change and rash.  ?Neurological:  Negative for dizziness, light-headedness and headaches.  ?Psychiatric/Behavioral:  Negative for agitation and dysphoric mood.   ? ?   ?  Objective:  ?  ? ?BP 124/70 (BP Location: Left Arm, Patient Position: Sitting, Cuff Size: Small)   Pulse 80   Temp 97.6 ?F (36.4 ?C) (Oral)   Resp 12   Ht 5\' 2"  (1.575 m)   Wt 140 lb (63.5 kg)   SpO2 95%   BMI 25.61 kg/m?  ?Wt Readings from Last 3 Encounters:  ?12/12/21 140 lb (63.5 kg)  ?10/20/21 141 lb (64 kg)  ?08/10/21 139 lb 6 oz (63.2 kg)  ? ? ?Physical Exam ?Vitals reviewed.  ?Constitutional:   ?   General: She is not in acute distress. ?   Appearance: Normal appearance.  ?HENT:  ?   Head: Normocephalic and atraumatic.  ?   Right Ear: External ear normal.  ?   Left Ear: External ear normal.  ?Eyes:  ?   General: No scleral  icterus.    ?   Right eye: No discharge.     ?   Left eye: No discharge.  ?   Conjunctiva/sclera: Conjunctivae normal.  ?Neck:  ?   Thyroid: No thyromegaly.  ?Cardiovascular:  ?   Rate and Rhythm: Normal rate and regular rhythm.  ?Pulmonary:  ?   Effort: No respiratory distress.  ?   Breath sounds: Normal breath sounds. No wheezing.  ?Abdominal:  ?   General: Bowel sounds are normal.  ?   Palpations: Abdomen is soft.  ?   Tenderness: There is no abdominal tenderness.  ?Musculoskeletal:     ?   General: No swelling or tenderness.  ?   Cervical back: Neck supple. No tenderness.  ?Lymphadenopathy:  ?   Cervical: No cervical adenopathy.  ?Skin: ?   Findings: No erythema or rash.  ?Neurological:  ?   Mental Status: She is alert.  ?Psychiatric:     ?   Mood and Affect: Mood normal.     ?   Behavior: Behavior normal.  ? ? ? ?Outpatient Encounter Medications as of 12/12/2021  ?Medication Sig  ? amLODipine (NORVASC) 5 MG tablet TAKE 1 TABLET BY MOUTH EVERY DAY  ? aspirin EC 81 MG tablet Take 81 mg by mouth daily. Swallow whole.  ? atorvastatin (LIPITOR) 40 MG tablet Take 1 tablet (40 mg total) by mouth daily.  ? cholecalciferol (VITAMIN D3) 25 MCG (1000 UNIT) tablet Take 1,000 Units by mouth daily.  ? citalopram (CELEXA) 20 MG tablet TAKE 1 TABLET BY MOUTH EVERY DAY  ? clopidogrel (PLAVIX) 75 MG tablet TAKE 1 TABLET BY MOUTH DAILY.  ? fluticasone (FLONASE) 50 MCG/ACT nasal spray Place 2 sprays into both nostrils daily. (Patient taking differently: Place 2 sprays into both nostrils as needed.)  ? losartan-hydrochlorothiazide (HYZAAR) 100-25 MG tablet TAKE 1 TABLET BY MOUTH EVERY DAY  ? potassium chloride (KLOR-CON M) 10 MEQ tablet Take 10 mEq by mouth once.  ? vitamin B-12 (CYANOCOBALAMIN) 1000 MCG tablet Take 1,000 mcg by mouth daily.  ? ?No facility-administered encounter medications on file as of 12/12/2021.  ?  ? ?Lab Results  ?Component Value Date  ? WBC 6.2 12/12/2021  ? HGB 13.9 12/12/2021  ? HCT 40.6 12/12/2021  ? PLT  259.0 12/12/2021  ? GLUCOSE 86 12/12/2021  ? CHOL 129 12/12/2021  ? TRIG 91.0 12/12/2021  ? HDL 39.30 12/12/2021  ? LDLDIRECT 129.0 03/26/2018  ? LDLCALC 72 12/12/2021  ? ALT 13 12/12/2021  ? AST 16 12/12/2021  ? NA 139 12/12/2021  ? K 3.5 12/12/2021  ? CL 102 12/12/2021  ? CREATININE 0.74  12/12/2021  ? BUN 11 12/12/2021  ? CO2 30 12/12/2021  ? TSH 1.03 12/12/2021  ? INR 1.0 06/01/2021  ? HGBA1C 5.7 (H) 06/02/2021  ? ? ?PERIPHERAL VASCULAR CATHETERIZATION ? ?Result Date: 06/12/2021 ?See surgical note for result. ? ? ?   ?Assessment & Plan:  ? ?Problem List Items Addressed This Visit   ? ? Anxiety  ?  Continue citalopram.   ?  ?  ? Aortic atherosclerosis ([place])  ?  Continue atorvastatin.   ?  ?  ? Carotid artery disease ([place])  ?  Saw Dr Lucky Cowboy - 10/20/21 - widely patent right carotid stent and 1-39% left carotid stenosis.  Continue aspirin and plavix and statin.  Recommended f/u in 6 months.  ?  ?  ? Essential hypertension, benign  ?  On losartan/hctz and amlodipine.  Blood pressure as outlined.  Follow pressures.  Follow metabolic panel.  ?  ?  ? Relevant Orders  ? CBC w/Diff (Completed)  ? Basic metabolic panel (Completed)  ? HLD (hyperlipidemia)  ? Relevant Orders  ? Lipid Profile (Completed)  ? Hepatic function panel (Completed)  ? TSH (Completed)  ? Hypercholesterolemia  ?  On lipitor.  Low cholesterol diet and exercise.  Follow lipid panel and liver function tests.   ?  ?  ? Hypokalemia  ? PAD (peripheral artery disease) ([place])  ?  S/p stent (carotid) - followed by vascular surgery.  Continue risk factor modification.  ?  ?  ? Vitamin D deficiency - Primary  ? ?Other Visit Diagnoses   ? ? Vitamin B12 deficiency      ? Relevant Orders  ? B12 (Completed)  ? Encounter for screening mammogram for malignant neoplasm of breast      ? Relevant Orders  ? MM 3D SCREEN BREAST BILATERAL  ? ?  ? ? ? ?Einar Pheasant, MD  ?

## 2021-12-16 NOTE — Assessment & Plan Note (Signed)
On losartan/hctz and amlodipine.  Blood pressure as outlined.  Follow pressures.  Follow metabolic panel.  

## 2021-12-16 NOTE — Assessment & Plan Note (Signed)
S/p stent (carotid) - followed by vascular surgery.  Continue risk factor modification.  

## 2021-12-16 NOTE — Assessment & Plan Note (Signed)
Saw Dr Dew - 10/20/21 - widely patent right carotid stent and 1-39% left carotid stenosis.  Continue aspirin and plavix and statin.  Recommended f/u in 6 months.  

## 2021-12-16 NOTE — Assessment & Plan Note (Signed)
Continue atorvastatin

## 2021-12-16 NOTE — Assessment & Plan Note (Signed)
-  Continue citalopram 

## 2021-12-16 NOTE — Assessment & Plan Note (Signed)
On lipitor.  Low cholesterol diet and exercise.  Follow lipid panel and liver function tests.   

## 2021-12-21 ENCOUNTER — Other Ambulatory Visit: Payer: Self-pay | Admitting: Internal Medicine

## 2022-01-15 NOTE — Progress Notes (Signed)
? ?NEUROLOGY FOLLOW UP OFFICE NOTE ? ?Kim Love ?[id number] ? ?Assessment/Plan:  ? 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 ?Right internal carotid artery stenosis status post stent- asymptomatic as the thalamic infarct would involve the posterior circulation. ?Hypertension ?Hyperlipidemia ?Tobacco use disorder ?  ?1  Secondary stroke prevention as managed by PCP ?- ASA 81mg  and Plavix 75mg  daily ?- atorvastatin.  LDL goal less than 70 ?- Normotensive blood pressure ?- Hgb A1c goal less than 7 ?3  Smoking cessation  ?4  Mediterranean diet ?5  Routine exercise (once cleared by vascular) ?6  Follow up 6 months ?  ?  ?Subjective:  ?Kim Love is a 66 year old female with HTN, hyperlipidemia and tobacco use who follows up for stroke. ? ?UPDATE: ?Current medications:  ASA 81mg , atorvastatin 40mg , amlodipine, losartan-HCTZ ? ?2 week cardiac event monitor in December revealed no a fib.  Carotid ultrasound on 10/20/2021 showed 1-39% bilateral ICA stenosis with antegrade flow of both VAs.  LDL from 12/12/2021 was 72.  Smoking 1/2 ppd.   ?  ?HISTORY: ?In September 2022, she developed sudden onset double vision and feeling off balance.  Left eye did not seem to move.  She went to Urgent Care and was treated for ear infection.  She later saw her ophthalmologist who ordered an MRI of the brain, performed the following week on 06/01/2021 which revealed a small subacute infarct in the right medial thalamus and 2 punctate subacute infarcts in the left deep parietal lobe.  She was sent to [facility] where she was admitted.  CTA head and neck showed more than 70% right ICA stenosis, 50-60% proximal left ICA stenosis.  Complete 2D echocardiogram showed EF 60-65% with no cardiac source of embolus.  LDL was 104 and Hgb A1c was 5.7.  She was not on  antithrombotic therapy or statin prior to admission.  She was discharged on ASA 81mg , Plavix 75mg  and atorvastatin 40mg  daily.  She subsequently underwent outpatient right carotid stent on 06/12/2021.  She reports no residual symptoms.   ? ?PAST MEDICAL HISTORY: ?Past Medical History:  ?Diagnosis Date  ? Anxiety   ? Hypercholesterolemia   ? Hypertension   ? Tobacco abuse   ? ? ?MEDICATIONS: ?Current Outpatient Medications on File Prior to Visit  ?Medication Sig Dispense Refill  ? amLODipine (NORVASC) 5 MG tablet TAKE 1 TABLET BY MOUTH EVERY DAY 90 tablet 3  ? aspirin EC 81 MG tablet Take 81 mg by mouth daily. Swallow whole.    ? atorvastatin (LIPITOR) 40 MG tablet TAKE 1 TABLET BY MOUTH EVERY DAY 90 tablet 1  ? cholecalciferol (VITAMIN D3) 25 MCG (1000 UNIT) tablet Take 1,000 Units by mouth daily.    ? citalopram (CELEXA) 20 MG tablet TAKE 1 TABLET BY MOUTH EVERY DAY 90 tablet 1  ? clopidogrel (PLAVIX) 75 MG tablet TAKE 1 TABLET BY MOUTH DAILY. 90 tablet 1  ? fluticasone (FLONASE) 50 MCG/ACT nasal spray Place 2 sprays into both nostrils daily. (Patient taking differently: Place 2 sprays into both nostrils as needed.) 16 g 6  ? losartan-hydrochlorothiazide (HYZAAR) 100-25 MG tablet TAKE 1 TABLET BY MOUTH EVERY DAY 90 tablet 3  ? potassium chloride (KLOR-CON M) 10 MEQ tablet Take 10 mEq by mouth once.    ? vitamin B-12 (CYANOCOBALAMIN) 1000 MCG tablet Take 1,000 mcg by mouth daily.    ? ?  No current facility-administered medications on file prior to visit.  ? ? ?ALLERGIES: ?Allergies  ?Allergen Reactions  ? Povidone-Iodine Swelling  ? Sulfamethoxazole-Trimethoprim Nausea And Vomiting  ? Atenolol Other (See Comments)  ?  Bradycardia  ? Codeine   ?  REACTION: "gallbladder issues"  ? Erythromycin   ?  REACTION: nausea  ? Erythromycin   ?  Upset stomach  ? Olmesartan   ?  Other reaction(s): Other (See Comments) ?Increased BP.  ? Sulfa Antibiotics   ? Codeine   ?  Gallbladder issues  ? ? ?FAMILY HISTORY: ?Family History   ?Problem Relation Age of Onset  ? Stroke Maternal Grandmother   ? Stroke Maternal Grandfather   ? ? ?  ?Objective:  ?Blood pressure (!) 143/80, pulse 92, height 5\' 2"  (1.575 m), weight 140 lb 12.8 oz (63.9 kg), SpO2 98 %. ?General: No acute distress.  Patient appears well-groomed.   ?Head:  Normocephalic/atraumatic ?Eyes:  Fundi examined but not visualized ?Heart:  Regular rate and rhythm ?Neurological Exam: alert and oriented to person, place, and time.  Speech fluent and not dysarthric, language intact.  CN II-XII intact. Bulk and tone normal, muscle strength 5/5 throughout.  Sensation to light touch intact.  Deep tendon reflexes 2+ throughout, toes downgoing.  Finger to nose testing intact.  Gait normal, Romberg negative. ? ? ?Metta Clines, DO ? ?CC: Einar Pheasant, MD ? ? ? ? ? ? ?

## 2022-01-16 ENCOUNTER — Ambulatory Visit: Payer: BC Managed Care – PPO | Admitting: Neurology

## 2022-01-16 ENCOUNTER — Encounter: Payer: Self-pay | Admitting: Neurology

## 2022-01-16 VITALS — BP 143/80 | HR 92 | Ht 62.0 in | Wt 140.8 lb

## 2022-01-16 DIAGNOSIS — I639 Cerebral infarction, unspecified: Secondary | ICD-10-CM

## 2022-01-16 DIAGNOSIS — I10 Essential (primary) hypertension: Secondary | ICD-10-CM

## 2022-01-16 DIAGNOSIS — I6521 Occlusion and stenosis of right carotid artery: Secondary | ICD-10-CM

## 2022-01-16 DIAGNOSIS — E785 Hyperlipidemia, unspecified: Secondary | ICD-10-CM

## 2022-01-16 DIAGNOSIS — Z720 Tobacco use: Secondary | ICD-10-CM

## 2022-01-16 NOTE — Patient Instructions (Addendum)
Continue aspirin, plavix, atorvastatin, blood pressure medication ?Try to quit smoking ?Mediterranean diet ?Follow up 6 months. ? ? ? ?Mediterranean Diet ?A Mediterranean diet refers to food and lifestyle choices that are based on the traditions of countries located on the [employer]. It focuses on eating more fruits, vegetables, whole grains, beans, nuts, seeds, and heart-healthy fats, and eating less dairy, meat, eggs, and processed foods with added sugar, salt, and fat. This way of eating has been shown to help prevent certain conditions and improve outcomes for people who have chronic diseases, like kidney disease and heart disease. ?What are tips for following this plan? ?Reading food labels ?Check the serving size of packaged foods. For foods such as rice and pasta, the serving size refers to the amount of cooked product, not dry. ?Check the total fat in packaged foods. Avoid foods that have saturated fat or trans fats. ?Check the ingredient list for added sugars, such as corn syrup. ?Shopping ? ?Buy a variety of foods that offer a balanced diet, including: ?Fresh fruits and vegetables (produce). ?Grains, beans, nuts, and seeds. Some of these may be available in unpackaged forms or large amounts (in bulk). ?Fresh seafood. ?Poultry and eggs. ?Low-fat dairy products. ?Buy whole ingredients instead of prepackaged foods. ?Buy fresh fruits and vegetables in-season from local farmers markets. ?Buy plain frozen fruits and vegetables. ?If you do not have access to quality fresh seafood, buy precooked frozen shrimp or canned fish, such as tuna, salmon, or sardines. ?Stock your pantry so you always have certain foods on hand, such as olive oil, canned tuna, canned tomatoes, rice, pasta, and beans. ?Cooking ?Cook foods with extra-virgin olive oil instead of using butter or other vegetable oils. ?Have meat as a side dish, and have vegetables or grains as your main dish. This means having meat in small portions or  adding small amounts of meat to foods like pasta or stew. ?Use beans or vegetables instead of meat in common dishes like chili or lasagna. ?Experiment with different cooking methods. Try roasting, broiling, steaming, and saut?ing vegetables. ?Add frozen vegetables to soups, stews, pasta, or rice. ?Add nuts or seeds for added healthy fats and plant protein at each meal. You can add these to yogurt, salads, or vegetable dishes. ?Marinate fish or vegetables using olive oil, lemon juice, garlic, and fresh herbs. ?Meal planning ?Plan to eat one vegetarian meal one day each week. Try to work up to two vegetarian meals, if possible. ?Eat seafood two or more times a week. ?Have healthy snacks readily available, such as: ?Vegetable sticks with hummus. ?[place] yogurt. ?Fruit and nut trail mix. ?Eat balanced meals throughout the week. This includes: ?Fruit: 2-3 servings a day. ?Vegetables: 4-5 servings a day. ?Low-fat dairy: 2 servings a day. ?Fish, poultry, or lean meat: 1 serving a day. ?Beans and legumes: 2 or more servings a week. ?Nuts and seeds: 1-2 servings a day. ?Whole grains: 6-8 servings a day. ?Extra-virgin olive oil: 3-4 servings a day. ?Limit red meat and sweets to only a few servings a month. ?Lifestyle ? ?Cook and eat meals together with your family, when possible. ?Drink enough fluid to keep your urine pale yellow. ?Be physically active every day. This includes: ?Aerobic exercise like running or swimming. ?Leisure activities like gardening, walking, or housework. ?Get 7-8 hours of sleep each night. ?If recommended by your health care provider, drink red wine in moderation. This means 1 glass a day for nonpregnant women and 2 glasses a day for men. A glass  of wine equals 5 oz (150 mL). ?What foods should I eat? ?Fruits ?Apples. Apricots. Avocado. Berries. Bananas. Cherries. Dates. Figs. Grapes. Lemons. Melon. Oranges. Peaches. Plums. Pomegranate. ?Vegetables ?Artichokes. Beets. Broccoli. Cabbage. Carrots.  Eggplant. Green beans. Chard. Kale. Spinach. Onions. Leeks. Peas. Squash. Tomatoes. Peppers. Radishes. ?Grains ?Whole-grain pasta. Brown rice. Bulgur wheat. Polenta. Couscous. Whole-wheat bread. [place]. ?Meats and other proteins ?Beans. Almonds. Sunflower seeds. Pine nuts. Peanuts. [place]. Salmon. Scallops. Shrimp. [place]. Tilapia. Clams. Oysters. Eggs. Poultry without skin. ?Dairy ?Low-fat milk. Cheese. Greek yogurt. ?Fats and oils ?Extra-virgin olive oil. Avocado oil. Grapeseed oil. ?Beverages ?Water. Red wine. Herbal tea. ?Sweets and desserts ?Greek yogurt with honey. Baked apples. Poached pears. Trail mix. ?Seasonings and condiments ?Bas[place]. Cilantro. Coriander. Cumin. Mint. Parsley. Sage. Rosemary. Tarragon. Garlic. Oregano. Thyme. Pepper. Balsamic vinegar. Tahini. Hummus. Tomato sauce. Olives. Mushrooms. ?The items listed above may not be a complete list of foods and beverages you can eat. Contact a dietitian for more information. ?What foods should I limit? ?This is a list of foods that should be eaten rarely or only on special occasions. ?Fruits ?Fruit canned in syrup. ?Vegetables ?Deep-fried potatoes (french fries). ?Grains ?Prepackaged pasta or rice dishes. Prepackaged cereal with added sugar. Prepackaged snacks with added sugar. ?Meats and other proteins ?Beef. Pork. Lamb. Poultry with skin. Hot dogs. [place] Sal[place]. ?Dairy ?Ice cream. Sour cream. Whole milk. ?Fats and oils ?Butter. Canola oil. Vegetable oil. Beef fat (tallow). Lard. ?Beverages ?Juice. Sugar-sweetened soft drinks. Beer. Liquor and spirits. ?Sweets and desserts ?Cookies. Cakes. Pies. Candy. ?Seasonings and condiments ?Mayonnaise. Pre-made sauces and marinades. ?The items listed above may not be a complete list of foods and beverages you should limit. Contact a dietitian for more information. ?Summary ?The Mediterranean diet includes both food and lifestyle choices. ?Eat a variety of fresh fruits and vegetables, beans, nuts, seeds, and whole  grains. ?Limit the amount of red meat and sweets that you eat. ?If recommended by your health care provider, drink red wine in moderation. This means 1 glass a day for nonpregnant women and 2 glasses a day for men. A glass of wine equals 5 oz (150 mL). ?This information is not intended to replace advice given to you by your health care provider. Make sure you discuss any questions you have with your health care provider. ?Document Revised: 10/02/2019 Document Reviewed: 07/30/2019 ?Elsevier Patient Education ? [place]. ? ?

## 2022-03-02 ENCOUNTER — Other Ambulatory Visit (INDEPENDENT_AMBULATORY_CARE_PROVIDER_SITE_OTHER): Payer: Self-pay | Admitting: Vascular Surgery

## 2022-03-05 ENCOUNTER — Encounter: Payer: Self-pay | Admitting: Family Medicine

## 2022-03-05 ENCOUNTER — Telehealth (INDEPENDENT_AMBULATORY_CARE_PROVIDER_SITE_OTHER): Payer: BC Managed Care – PPO | Admitting: Family Medicine

## 2022-03-05 DIAGNOSIS — J0140 Acute pansinusitis, unspecified: Secondary | ICD-10-CM

## 2022-03-05 MED ORDER — AMOXICILLIN-POT CLAVULANATE 875-125 MG PO TABS: 1.0000 | ORAL_TABLET | Freq: Two times a day (BID) | ORAL | 0 refills | Status: DC

## 2022-03-11 ENCOUNTER — Other Ambulatory Visit: Payer: Self-pay | Admitting: Internal Medicine

## 2022-03-22 ENCOUNTER — Other Ambulatory Visit: Payer: Self-pay | Admitting: Internal Medicine

## 2022-04-13 ENCOUNTER — Ambulatory Visit: Payer: BC Managed Care – PPO | Admitting: Internal Medicine

## 2022-04-13 ENCOUNTER — Encounter: Payer: Self-pay | Admitting: Internal Medicine

## 2022-04-13 VITALS — BP 122/76 | HR 76 | Temp 98.2°F | Resp 18 | Ht 62.0 in | Wt 135.0 lb

## 2022-04-13 DIAGNOSIS — I10 Essential (primary) hypertension: Secondary | ICD-10-CM

## 2022-04-13 DIAGNOSIS — I739 Peripheral vascular disease, unspecified: Secondary | ICD-10-CM

## 2022-04-13 DIAGNOSIS — E7800 Pure hypercholesterolemia, unspecified: Secondary | ICD-10-CM

## 2022-04-13 DIAGNOSIS — R5383 Other fatigue: Secondary | ICD-10-CM

## 2022-04-13 DIAGNOSIS — E876 Hypokalemia: Secondary | ICD-10-CM

## 2022-04-13 DIAGNOSIS — I6523 Occlusion and stenosis of bilateral carotid arteries: Secondary | ICD-10-CM | POA: Diagnosis not present

## 2022-04-13 DIAGNOSIS — I700 Atherosclerosis of aorta: Secondary | ICD-10-CM

## 2022-04-13 DIAGNOSIS — E785 Hyperlipidemia, unspecified: Secondary | ICD-10-CM | POA: Diagnosis not present

## 2022-04-13 DIAGNOSIS — F419 Anxiety disorder, unspecified: Secondary | ICD-10-CM

## 2022-04-13 DIAGNOSIS — D649 Anemia, unspecified: Secondary | ICD-10-CM

## 2022-04-13 DIAGNOSIS — R739 Hyperglycemia, unspecified: Secondary | ICD-10-CM

## 2022-04-13 NOTE — Progress Notes (Signed)
Patient ID: Kim Love, female   DOB: 1956/08/16, 66 y.o.   MRN: [id number]   Subjective:    Patient ID: Kim Love, female    DOB: [birth year], 66 y.o.   MRN: [id number]   Patient here for a scheduled follow up.   Chief Complaint  Patient presents with   Hyperlipidemia   .   Hyperlipidemia Pertinent negatives include no chest pain, myalgias or shortness of breath.   She reports she is doing relatively well.  Stays active.  No chest pain or sob reported.  No abdominal pain.  Bowels moving.  Some fatigue.  Takes a nap during day.  Discussed possible sleep apnea.  She wants to hold on further evaluation.  Feels sleeps ok.  Energy stable.  Handling stress.  Previous CVA.  Seeing Dr Tomi Likens.  Continue aspirin, plavix and lipitor.     Past Medical History:  Diagnosis Date   Anxiety    Hypercholesterolemia    Hypertension    Tobacco abuse    Past Surgical History:  Procedure Laterality Date   CAROTID PTA/STENT INTERVENTION Right 06/12/2021   Procedure: CAROTID PTA/STENT INTERVENTION;  Surgeon: Algernon Huxley, MD;  Location: [facility];  Service: Cardiovascular;  Laterality: Right;   TONSILLECTOMY AND ADENOIDECTOMY Bilateral 1980   Family History  Problem Relation Age of Onset   Stroke Maternal Grandmother    Stroke Maternal Grandfather    Social History   Socioeconomic History   Marital status: Married    Spouse name: Doctor, general practice   Number of children: 1   Years of education: Not on file   Highest education level: Not on file  Occupational History    Employer: [employer]  Tobacco Use   Smoking status: Every Day    Packs/day: 1.00    Years: 30.00    Total pack years: 30.00    Types: Cigarettes    Last attempt to quit: 09/10/2012    Years since quitting: 9.6   Smokeless tobacco: Never  Vaping Use   Vaping Use: Never used  Substance and Sexual Activity   Alcohol use: No    Comment: "once in a blue moon: I can't remember the last time."   Drug use: No   Sexual  activity: Not on file  Other Topics Concern   Not on file  Social History Narrative   Lives at home with spouse       Right handed   Social Determinants of Health   Financial Resource Strain: Not on file  Food Insecurity: Not on file  Transportation Needs: Not on file  Physical Activity: Not on file  Stress: Not on file  Social Connections: Not on file     Review of Systems  Constitutional:  Negative for appetite change and unexpected weight change.  HENT:  Negative for congestion and sinus pressure.   Respiratory:  Negative for cough, chest tightness and shortness of breath.   Cardiovascular:  Negative for chest pain, palpitations and leg swelling.  Gastrointestinal:  Negative for abdominal pain, diarrhea, nausea and vomiting.  Genitourinary:  Negative for difficulty urinating and dysuria.  Musculoskeletal:  Negative for joint swelling and myalgias.  Skin:  Negative for color change and rash.  Neurological:  Negative for dizziness, light-headedness and headaches.  Psychiatric/Behavioral:  Negative for agitation and dysphoric mood.        Objective:     BP 122/76 (BP Location: Left Arm, Patient Position: Sitting, Cuff Size: Small)   Pulse 76  Temp 98.2 F (36.8 C) (Temporal)   Resp 18   Ht 5' 2"  (1.575 m)   Wt 135 lb (61.2 kg)   SpO2 100%   BMI 24.69 kg/m  Wt Readings from Last 3 Encounters:  04/13/22 135 lb (61.2 kg)  03/05/22 140 lb (63.5 kg)  01/16/22 140 lb 12.8 oz (63.9 kg)    Physical Exam Vitals reviewed.  Constitutional:      General: She is not in acute distress.    Appearance: Normal appearance.  HENT:     Head: Normocephalic and atraumatic.     Right Ear: External ear normal.     Left Ear: External ear normal.  Eyes:     General: No scleral icterus.       Right eye: No discharge.        Left eye: No discharge.     Conjunctiva/sclera: Conjunctivae normal.  Neck:     Thyroid: No thyromegaly.  Cardiovascular:     Rate and Rhythm: Normal  rate and regular rhythm.  Pulmonary:     Effort: No respiratory distress.     Breath sounds: Normal breath sounds. No wheezing.  Abdominal:     General: Bowel sounds are normal.     Palpations: Abdomen is soft.     Tenderness: There is no abdominal tenderness.  Musculoskeletal:        General: No swelling or tenderness.     Cervical back: Neck supple. No tenderness.  Lymphadenopathy:     Cervical: No cervical adenopathy.  Skin:    Findings: No erythema or rash.  Neurological:     Mental Status: She is alert.  Psychiatric:        Mood and Affect: Mood normal.        Behavior: Behavior normal.      Outpatient Encounter Medications as of 04/13/2022  Medication Sig   amLODipine (NORVASC) 5 MG tablet TAKE 1 TABLET BY MOUTH EVERY DAY   amoxicillin-clavulanate (AUGMENTIN) 875-125 MG tablet Take 1 tablet by mouth 2 (two) times daily.   aspirin EC 81 MG tablet Take 81 mg by mouth daily. Swallow whole.   atorvastatin (LIPITOR) 40 MG tablet TAKE 1 TABLET BY MOUTH EVERY DAY   cholecalciferol (VITAMIN D3) 25 MCG (1000 UNIT) tablet Take 1,000 Units by mouth daily.   citalopram (CELEXA) 20 MG tablet TAKE 1 TABLET BY MOUTH EVERY DAY   clopidogrel (PLAVIX) 75 MG tablet TAKE 1 TABLET BY MOUTH EVERY DAY   fluticasone (FLONASE) 50 MCG/ACT nasal spray Place 2 sprays into both nostrils daily. (Patient taking differently: Place 2 sprays into both nostrils as needed.)   losartan-hydrochlorothiazide (HYZAAR) 100-25 MG tablet TAKE 1 TABLET BY MOUTH EVERY DAY   potassium chloride (KLOR-CON M) 10 MEQ tablet Take 10 mEq by mouth once.   vitamin B-12 (CYANOCOBALAMIN) 1000 MCG tablet Take 1,000 mcg by mouth daily.   No facility-administered encounter medications on file as of 04/13/2022.     Lab Results  Component Value Date   WBC 6.2 12/12/2021   HGB 13.9 12/12/2021   HCT 40.6 12/12/2021   PLT 259.0 12/12/2021   GLUCOSE 86 12/12/2021   CHOL 129 12/12/2021   TRIG 91.0 12/12/2021   HDL 39.30 12/12/2021    LDLDIRECT 129.0 03/26/2018   LDLCALC 72 12/12/2021   ALT 13 12/12/2021   AST 16 12/12/2021   NA 139 12/12/2021   K 3.5 12/12/2021   CL 102 12/12/2021   CREATININE 0.74 12/12/2021   BUN 11 12/12/2021  CO2 30 12/12/2021   TSH 1.03 12/12/2021   INR 1.0 06/01/2021   HGBA1C 5.7 (H) 06/02/2021    PERIPHERAL VASCULAR CATHETERIZATION  Result Date: 06/12/2021 See surgical note for result.      Assessment & Plan:   Problem List Items Addressed This Visit     Anemia    Reviewed labs.  hgb decreased last check.  Recheck cbc and check iron studies and b12.       Relevant Orders   CBC with Differential/Platelet   Vitamin B12   IBC + Ferritin   Anxiety    Continue citalopram.        Aortic atherosclerosis (HCC)    Continue atorvastatin.        Carotid artery disease (HCC)    Saw Dr Lucky Cowboy - 10/20/21 - widely patent right carotid stent and 1-39% left carotid stenosis.  Continue aspirin and plavix and statin.  Recommended f/u in 6 months.       Essential hypertension, benign    On losartan/hctz and amlodipine.  Blood pressure as outlined.  Follow pressures.  Follow metabolic panel.       Relevant Orders   Basic metabolic panel   Fatigue    Reports some fatigue as outlined.  Discussed may be multifactorial.  Discussed exercise.  Continue current medications.  Discussed possible sleep apnea.  Will notify me if desires any further testing.  Discussed not lying supine and avoiding sedating medications, etc.       HLD (hyperlipidemia) - Primary   Relevant Orders   Lipid Profile   Hepatic function panel   Lipid panel   Hypercholesterolemia    On lipitor.  Low cholesterol diet and exercise.  Follow lipid panel and liver function tests.        Relevant Orders   Hepatic function panel   Lipid panel   Hyperglycemia    Low carb diet and exercise.  Follow met b and a1c.       Relevant Orders   Hemoglobin A1c   Hypokalemia   Relevant Orders   Basic Metabolic Panel (BMET)    PAD (peripheral artery disease) ([place])    S/p stent (carotid) - followed by vascular surgery.  Continue risk factor modification.         Einar Pheasant, MD

## 2022-04-15 ENCOUNTER — Encounter: Payer: Self-pay | Admitting: Internal Medicine

## 2022-04-15 DIAGNOSIS — D649 Anemia, unspecified: Secondary | ICD-10-CM | POA: Insufficient documentation

## 2022-04-15 DIAGNOSIS — R739 Hyperglycemia, unspecified: Secondary | ICD-10-CM | POA: Insufficient documentation

## 2022-04-15 NOTE — Assessment & Plan Note (Signed)
On lipitor.  Low cholesterol diet and exercise.  Follow lipid panel and liver function tests.   

## 2022-04-15 NOTE — Assessment & Plan Note (Signed)
Reviewed labs.  hgb decreased last check.  Recheck cbc and check iron studies and b12.  

## 2022-04-15 NOTE — Assessment & Plan Note (Signed)
S/p stent (carotid) - followed by vascular surgery.  Continue risk factor modification.  

## 2022-04-15 NOTE — Assessment & Plan Note (Signed)
Reports some fatigue as outlined.  Discussed may be multifactorial.  Discussed exercise.  Continue current medications.  Discussed possible sleep apnea.  Will notify me if desires any further testing.  Discussed not lying supine and avoiding sedating medications, etc.

## 2022-04-15 NOTE — Assessment & Plan Note (Signed)
Continue atorvastatin

## 2022-04-15 NOTE — Assessment & Plan Note (Signed)
On losartan/hctz and amlodipine.  Blood pressure as outlined.  Follow pressures.  Follow metabolic panel.  

## 2022-04-15 NOTE — Assessment & Plan Note (Signed)
Saw Dr Dew - 10/20/21 - widely patent right carotid stent and 1-39% left carotid stenosis.  Continue aspirin and plavix and statin.  Recommended f/u in 6 months.  

## 2022-04-15 NOTE — Assessment & Plan Note (Signed)
-  Continue citalopram 

## 2022-04-15 NOTE — Assessment & Plan Note (Signed)
Low carb diet and exercise.  Follow met b and a1c.  

## 2022-04-20 ENCOUNTER — Encounter (INDEPENDENT_AMBULATORY_CARE_PROVIDER_SITE_OTHER): Payer: Self-pay | Admitting: Vascular Surgery

## 2022-04-20 ENCOUNTER — Ambulatory Visit (INDEPENDENT_AMBULATORY_CARE_PROVIDER_SITE_OTHER): Payer: BC Managed Care – PPO

## 2022-04-20 ENCOUNTER — Ambulatory Visit (INDEPENDENT_AMBULATORY_CARE_PROVIDER_SITE_OTHER): Payer: BC Managed Care – PPO | Admitting: Vascular Surgery

## 2022-04-20 VITALS — BP 128/75 | HR 85 | Resp 16 | Wt 134.6 lb

## 2022-04-20 DIAGNOSIS — I10 Essential (primary) hypertension: Secondary | ICD-10-CM | POA: Diagnosis not present

## 2022-04-20 DIAGNOSIS — E785 Hyperlipidemia, unspecified: Secondary | ICD-10-CM | POA: Diagnosis not present

## 2022-04-20 DIAGNOSIS — I63239 Cerebral infarction due to unspecified occlusion or stenosis of unspecified carotid arteries: Secondary | ICD-10-CM

## 2022-04-20 NOTE — Progress Notes (Signed)
MRN : [id number]  Kim Love is a 66 y.o. ([birth year]) female who presents with chief complaint of  Chief Complaint  Patient presents with   Follow-up    Ultrasound follow up  .  History of Present Illness: Patient returns today in follow up of her carotid stenosis.  She is about 10 months status post right carotid stent placement for high-grade stenosis with previous stroke.  She has done well since the procedure.  No further neurologic symptoms.  She has mild velocity increase up to 132 cm/s in the right ICA stent in the midsegment that would indicate only mild recurrent stenosis.  Her left carotid artery is in the 1 to 39% range.  Current Outpatient Medications  Medication Sig Dispense Refill   amLODipine (NORVASC) 5 MG tablet TAKE 1 TABLET BY MOUTH EVERY DAY 90 tablet 3   amoxicillin-clavulanate (AUGMENTIN) 875-125 MG tablet Take 1 tablet by mouth 2 (two) times daily. 14 tablet 0   aspirin EC 81 MG tablet Take 81 mg by mouth daily. Swallow whole.     atorvastatin (LIPITOR) 40 MG tablet TAKE 1 TABLET BY MOUTH EVERY DAY 90 tablet 1   cholecalciferol (VITAMIN D3) 25 MCG (1000 UNIT) tablet Take 1,000 Units by mouth daily.     citalopram (CELEXA) 20 MG tablet TAKE 1 TABLET BY MOUTH EVERY DAY 90 tablet 1   clopidogrel (PLAVIX) 75 MG tablet TAKE 1 TABLET BY MOUTH EVERY DAY 90 tablet 1   fluticasone (FLONASE) 50 MCG/ACT nasal spray Place 2 sprays into both nostrils daily. (Patient taking differently: Place 2 sprays into both nostrils as needed.) 16 g 6   losartan-hydrochlorothiazide (HYZAAR) 100-25 MG tablet TAKE 1 TABLET BY MOUTH EVERY DAY 90 tablet 3   potassium chloride (KLOR-CON M) 10 MEQ tablet Take 10 mEq by mouth once.     vitamin B-12 (CYANOCOBALAMIN) 1000 MCG tablet Take 1,000 mcg by mouth daily.     No current facility-administered medications for this visit.    Past Medical History:  Diagnosis Date   Anxiety    Hypercholesterolemia    Hypertension    Tobacco abuse      Past Surgical History:  Procedure Laterality Date   CAROTID PTA/STENT INTERVENTION Right 06/12/2021   Procedure: CAROTID PTA/STENT INTERVENTION;  Surgeon: Annice Needy, MD;  Location: [facility];  Service: Cardiovascular;  Laterality: Right;   TONSILLECTOMY AND ADENOIDECTOMY Bilateral 1980     Social History   Tobacco Use   Smoking status: Every Day    Packs/day: 1.00    Years: 30.00    Total pack years: 30.00    Types: Cigarettes    Last attempt to quit: 09/10/2012    Years since quitting: 9.6   Smokeless tobacco: Never  Vaping Use   Vaping Use: Never used  Substance Use Topics   Alcohol use: No    Comment: "once in a blue moon: I can't remember the last time."   Drug use: No      Family History  Problem Relation Age of Onset   Stroke Maternal Grandmother    Stroke Maternal Grandfather      Allergies  Allergen Reactions   Povidone-Iodine Swelling   Sulfamethoxazole-Trimethoprim Nausea And Vomiting   Atenolol Other (See Comments)    Bradycardia   Codeine     REACTION: "gallbladder issues"   Erythromycin     REACTION: nausea   Erythromycin     Upset stomach   Olmesartan  Other reaction(s): Other (See Comments) Increased BP.   Sulfa Antibiotics    Codeine     Gallbladder issues      REVIEW OF SYSTEMS (Negative unless checked)   Constitutional: [] Weight loss  [] Fever  [] Chills Cardiac: [] Chest pain   [] Chest pressure   [] Palpitations   [] Shortness of breath when laying flat   [] Shortness of breath at rest   [] Shortness of breath with exertion. 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   [] Dysphagia   [] Weakness or numbness in arms   [] Weakness or numbness in legs Musculoskeletal:  [] Arthritis   [] Joint swelling   [] Joint pain   [] Low back pain Hematologic:  [] Easy bruising  [] Easy bleeding   [] Hypercoagulable state   [] Anemic   Gastrointestinal:  [] Blood in stool   [] Vomiting blood  [] Gastroesophageal reflux/heartburn   [] Abdominal pain Genitourinary:  [] Chronic kidney disease   [] Difficult urination  [] Frequent urination  [] Burning with urination   [] Hematuria Skin:  [] Rashes   [] Ulcers   [] Wounds Psychological:  [x] History of anxiety   []  History of major depression.   Physical Examination  BP 128/75 (BP Location: Left Arm)   Pulse 85   Resp 16   Wt 134 lb 9.6 oz (61.1 kg)   BMI 24.62 kg/m  Gen:  WD/WN, NAD Head: [place]/AT, No temporalis wasting. Ear/Nose/Throat: Hearing grossly intact, nares w/o erythema or drainage Eyes: Conjunctiva clear. Sclera non-icteric Neck: Supple.  Trachea midline.  No bruit Pulmonary:  Good air movement, no use of accessory muscles.  Cardiac: RRR, no JVD Vascular:  Vessel Right Left  Radial Palpable Palpable               Musculoskeletal: M/S 5/5 throughout.  No deformity or atrophy.  No edema. Neurologic: Sensation grossly intact in extremities.  Symmetrical.  Speech is fluent.  Psychiatric: Judgment intact, Mood & affect appropriate for pt's clinical situation. Dermatologic: No rashes or ulcers noted.  No cellulitis or open wounds.      Labs No results found for this or any previous visit (from the past 2160 hour(s)).  Radiology No results found.  Assessment/Plan Essential hypertension, benign blood pressure control important in reducing the progression of atherosclerotic disease. On appropriate oral medications.     HLD (hyperlipidemia) lipid control important in reducing the  progression of atherosclerotic disease. Continue statin therapy  Carotid stenosis, symptomatic, with infarction [facility]) She has mild velocity increase up to 132 cm/s in the right ICA stent in the midsegment that would indicate only mild recurrent stenosis.  Her left carotid artery is in the 1 to 39% range.  Continue current medical regimen.  She says she may have an upcoming eye surgery and colonoscopy and she needs to stop her Plavix for 5 days for either of these that is reasonable and just resume the Plavix the day after the procedure.  Return in 6 months with carotid  duplex    [name], MD  04/20/2022 9:35 AM    This note was created with Dragon medical transcription system.  Any errors from dictation are purely unintentional

## 2022-04-20 NOTE — Assessment & Plan Note (Signed)
She has mild velocity increase up to 132 cm/s in the right ICA stent in the midsegment that would indicate only mild recurrent stenosis.  Her left carotid artery is in the 1 to 39% range.  Continue current medical regimen.  She says she may have an upcoming eye surgery and colonoscopy and she needs to stop her Plavix for 5 days for either of these that is reasonable and just resume the Plavix the day after the procedure.  Return in 6 months with carotid duplex

## 2022-04-26 ENCOUNTER — Other Ambulatory Visit (INDEPENDENT_AMBULATORY_CARE_PROVIDER_SITE_OTHER): Payer: BC Managed Care – PPO

## 2022-04-26 DIAGNOSIS — E785 Hyperlipidemia, unspecified: Secondary | ICD-10-CM | POA: Diagnosis not present

## 2022-04-26 DIAGNOSIS — E7800 Pure hypercholesterolemia, unspecified: Secondary | ICD-10-CM

## 2022-04-26 DIAGNOSIS — R739 Hyperglycemia, unspecified: Secondary | ICD-10-CM

## 2022-04-26 DIAGNOSIS — I10 Essential (primary) hypertension: Secondary | ICD-10-CM | POA: Diagnosis not present

## 2022-04-26 DIAGNOSIS — D649 Anemia, unspecified: Secondary | ICD-10-CM | POA: Diagnosis not present

## 2022-04-26 LAB — CBC WITH DIFFERENTIAL/PLATELET
Basophils Absolute: 0.1 10*3/uL (ref 0.0–0.1)
Basophils Relative: 1 % (ref 0.0–3.0)
Eosinophils Absolute: 0.4 10*3/uL (ref 0.0–0.7)
Eosinophils Relative: 6.3 % — ABNORMAL HIGH (ref 0.0–5.0)
HCT: 43.2 % (ref 36.0–46.0)
Hemoglobin: 14.4 g/dL (ref 12.0–15.0)
Lymphocytes Relative: 19.3 % (ref 12.0–46.0)
Lymphs Abs: 1.3 10*3/uL (ref 0.7–4.0)
MCHC: 33.3 g/dL (ref 30.0–36.0)
MCV: 90.8 fl (ref 78.0–100.0)
Monocytes Absolute: 0.4 10*3/uL (ref 0.1–1.0)
Monocytes Relative: 6.3 % (ref 3.0–12.0)
Neutro Abs: 4.6 10*3/uL (ref 1.4–7.7)
Neutrophils Relative %: 67.1 % (ref 43.0–77.0)
Platelets: 244 10*3/uL (ref 150.0–400.0)
RBC: 4.76 Mil/uL (ref 3.87–5.11)
RDW: 14 % (ref 11.5–15.5)
WBC: 6.9 10*3/uL (ref 4.0–10.5)

## 2022-04-26 LAB — LIPID PANEL
Cholesterol: 138 mg/dL (ref 0–200)
HDL: 41.5 mg/dL (ref >39.00)
LDL Cholesterol: 82 mg/dL (ref 0–99)
NonHDL: 96.83
Total CHOL/HDL Ratio: 3
Triglycerides: 73 mg/dL (ref 0.0–149.0)
VLDL: 14.6 mg/dL (ref 0.0–40.0)

## 2022-04-26 LAB — HEPATIC FUNCTION PANEL
ALT: 12 U/L (ref 0–35)
AST: 15 U/L (ref 0–37)
Albumin: 4.2 g/dL (ref 3.5–5.2)
Alkaline Phosphatase: 100 U/L (ref 39–117)
Bilirubin, Direct: 0.1 mg/dL (ref 0.0–0.3)
Total Bilirubin: 0.5 mg/dL (ref 0.2–1.2)
Total Protein: 6.8 g/dL (ref 6.0–8.3)

## 2022-04-26 LAB — BASIC METABOLIC PANEL
BUN: 13 mg/dL (ref 6–23)
CO2: 29 mEq/L (ref 19–32)
Calcium: 8.9 mg/dL (ref 8.4–10.5)
Chloride: 100 mEq/L (ref 96–112)
Creatinine, Ser: 0.8 mg/dL (ref 0.40–1.20)
GFR: 77.59 mL/min (ref >60.00)
Glucose, Bld: 90 mg/dL (ref 70–99)
Potassium: 3.6 mEq/L (ref 3.5–5.1)
Sodium: 140 mEq/L (ref 135–145)

## 2022-04-26 LAB — HEMOGLOBIN A1C: Hgb A1c MFr Bld: 5.8 % (ref 4.6–6.5)

## 2022-04-26 LAB — IBC + FERRITIN
Ferritin: 20.1 ng/mL (ref 10.0–291.0)
Iron: 57 ug/dL (ref 42–145)
Saturation Ratios: 14.3 % — ABNORMAL LOW (ref 20.0–50.0)
TIBC: 399 ug/dL (ref 250.0–450.0)
Transferrin: 285 mg/dL (ref 212.0–360.0)

## 2022-04-26 LAB — VITAMIN B12: Vitamin B-12: 550 pg/mL (ref 211–911)

## 2022-04-27 ENCOUNTER — Telehealth: Payer: Self-pay

## 2022-04-27 NOTE — Telephone Encounter (Signed)
Spoke to Patient about her lab results and Dr. Roby Lofts recommendation of making an appointment to discuss her cholesterol given that she has had a stroke and stroke prevention. Appointment made for 05/18/2022.

## 2022-04-27 NOTE — Telephone Encounter (Signed)
Spoke to Patient.

## 2022-04-27 NOTE — Progress Notes (Signed)
Spoke to Patient about her lab results and Dr. Scott's recommendation of making an appointment to discuss her cholesterol given that she has had a stroke and stroke prevention. Appointment made for 05/18/2022. 

## 2022-05-18 ENCOUNTER — Encounter: Payer: Self-pay | Admitting: Internal Medicine

## 2022-05-18 ENCOUNTER — Ambulatory Visit (INDEPENDENT_AMBULATORY_CARE_PROVIDER_SITE_OTHER): Payer: Medicare Other | Admitting: Internal Medicine

## 2022-05-18 DIAGNOSIS — E7800 Pure hypercholesterolemia, unspecified: Secondary | ICD-10-CM | POA: Diagnosis not present

## 2022-05-18 DIAGNOSIS — I6523 Occlusion and stenosis of bilateral carotid arteries: Secondary | ICD-10-CM

## 2022-05-18 DIAGNOSIS — I739 Peripheral vascular disease, unspecified: Secondary | ICD-10-CM

## 2022-05-18 DIAGNOSIS — I10 Essential (primary) hypertension: Secondary | ICD-10-CM | POA: Diagnosis not present

## 2022-05-18 DIAGNOSIS — R739 Hyperglycemia, unspecified: Secondary | ICD-10-CM

## 2022-05-18 DIAGNOSIS — I700 Atherosclerosis of aorta: Secondary | ICD-10-CM

## 2022-05-18 MED ORDER — ROSUVASTATIN CALCIUM 40 MG PO TABS: 40.0000 mg | ORAL_TABLET | Freq: Every day | ORAL | 1 refills | Status: DC

## 2022-05-18 NOTE — Progress Notes (Unsigned)
Patient ID: Kim Love, female   DOB: Oct 15, 1955, 66 y.o.   MRN: [id number]   Subjective:    Patient ID: Kim Love, female    DOB: October 07, 1955, 66 y.o.   MRN: [id number]  This visit occurred during the SARS-CoV-2 public health emergency.  Safety protocols were in place, including screening questions prior to the visit, additional usage of staff PPE, and extensive cleaning of exam room while observing appropriate contact time as indicated for disinfecting solutions.   Patient here for  Chief Complaint  Patient presents with   Hyperlipidemia    Discuss treating hyperlipidemia.    Marland Kitchen   HPI    Past Medical History:  Diagnosis Date   Anxiety    Hypercholesterolemia    Hypertension    Tobacco abuse    Past Surgical History:  Procedure Laterality Date   CAROTID PTA/STENT INTERVENTION Right 06/12/2021   Procedure: CAROTID PTA/STENT INTERVENTION;  Surgeon: Annice Needy, MD;  Location: [facility];  Service: Cardiovascular;  Laterality: Right;   TONSILLECTOMY AND ADENOIDECTOMY Bilateral 1980   Family History  Problem Relation Age of Onset   Stroke Maternal Grandmother    Stroke Maternal Grandfather    Social History   Socioeconomic History   Marital status: Married    Spouse name: Merchant navy officer   Number of children: 1   Years of education: Not on file   Highest education level: Not on file  Occupational History    Employer: [employer]  Tobacco Use   Smoking status: Every Day    Packs/day: 1.00    Years: 30.00    Total pack years: 30.00    Types: Cigarettes    Last attempt to quit: 09/10/2012    Years since quitting: 9.6   Smokeless tobacco: Never  Vaping Use   Vaping Use: Never used  Substance and Sexual Activity   Alcohol use: No    Comment: "once in a blue moon: I can't remember the last time."   Drug use: No   Sexual activity: Not on file  Other Topics Concern   Not on file  Social History Narrative   Lives at home with spouse       Right handed    Social Determinants of Health   Financial Resource Strain: Not on file  Food Insecurity: Not on file  Transportation Needs: Not on file  Physical Activity: Not on file  Stress: Not on file  Social Connections: Not on file     Review of Systems     Objective:     BP 110/70 (BP Location: Left Arm, Patient Position: Sitting, Cuff Size: Normal)   Pulse 76   Temp 97.8 F (36.6 C) (Oral)   Ht 5\' 2"  (1.575 m)   Wt 133 lb 9.6 oz (60.6 kg)   SpO2 97%   BMI 24.44 kg/m  Wt Readings from Last 3 Encounters:  05/18/22 133 lb 9.6 oz (60.6 kg)  04/20/22 134 lb 9.6 oz (61.1 kg)  04/13/22 135 lb (61.2 kg)    Physical Exam   Outpatient Encounter Medications as of 05/18/2022  Medication Sig   amLODipine (NORVASC) 5 MG tablet TAKE 1 TABLET BY MOUTH EVERY DAY   aspirin EC 81 MG tablet Take 81 mg by mouth daily. Swallow whole.   atorvastatin (LIPITOR) 40 MG tablet TAKE 1 TABLET BY MOUTH EVERY DAY   cholecalciferol (VITAMIN D3) 25 MCG (1000 UNIT) tablet Take 1,000 Units by mouth daily.   citalopram (CELEXA) 20  MG tablet TAKE 1 TABLET BY MOUTH EVERY DAY   clopidogrel (PLAVIX) 75 MG tablet TAKE 1 TABLET BY MOUTH EVERY DAY   fluticasone (FLONASE) 50 MCG/ACT nasal spray Place 2 sprays into both nostrils daily. (Patient taking differently: Place 2 sprays into both nostrils as needed.)   losartan-hydrochlorothiazide (HYZAAR) 100-25 MG tablet TAKE 1 TABLET BY MOUTH EVERY DAY   potassium chloride (KLOR-CON M) 10 MEQ tablet Take 10 mEq by mouth once.   vitamin B-12 (CYANOCOBALAMIN) 1000 MCG tablet Take 1,000 mcg by mouth daily.   [DISCONTINUED] amoxicillin-clavulanate (AUGMENTIN) 875-125 MG tablet Take 1 tablet by mouth 2 (two) times daily. (Patient not taking: Reported on 05/18/2022)   No facility-administered encounter medications on file as of 05/18/2022.     Lab Results  Component Value Date   WBC 6.9 04/26/2022   HGB 14.4 04/26/2022   HCT 43.2 04/26/2022   PLT 244.0 04/26/2022   GLUCOSE  90 04/26/2022   CHOL 138 04/26/2022   TRIG 73.0 04/26/2022   HDL 41.50 04/26/2022   LDLDIRECT 129.0 03/26/2018   LDLCALC 82 04/26/2022   ALT 12 04/26/2022   AST 15 04/26/2022   NA 140 04/26/2022   K 3.6 04/26/2022   CL 100 04/26/2022   CREATININE 0.80 04/26/2022   BUN 13 04/26/2022   CO2 29 04/26/2022   TSH 1.03 12/12/2021   INR 1.0 06/01/2021   HGBA1C 5.8 04/26/2022    PERIPHERAL VASCULAR CATHETERIZATION  Result Date: 06/12/2021 See surgical note for result.      Assessment & Plan:   Problem List Items Addressed This Visit   None    Dale [place], MD

## 2022-05-20 ENCOUNTER — Encounter: Payer: Self-pay | Admitting: Internal Medicine

## 2022-05-20 NOTE — Assessment & Plan Note (Signed)
Continue atorvastatin

## 2022-05-20 NOTE — Assessment & Plan Note (Signed)
S/p stent (carotid) - followed by vascular surgery.  Continue risk factor modification.  

## 2022-05-20 NOTE — Assessment & Plan Note (Signed)
On losartan/hctz and amlodipine.  Blood pressure as outlined.  Follow pressures.  Follow metabolic panel.  

## 2022-05-20 NOTE — Assessment & Plan Note (Signed)
Low carb diet and exercise.  Follow met b and a1c.  

## 2022-05-20 NOTE — Assessment & Plan Note (Signed)
Saw Dr Wyn Quaker - 10/20/21 - widely patent right carotid stent and 1-39% left carotid stenosis.  Continue aspirin and plavix and statin.  Recommended f/u in 6 months.

## 2022-05-20 NOTE — Assessment & Plan Note (Signed)
On lipitor 40mg  q day. History of stroke.  LDL on recent check 82.  Discussed need for lower LDL given her history.  Discussed various treatment options including changing to crestor, addition of zetia or starting repatha. Discussed each medication and side effects, etc. She would like to hold on injection at this time.  Wants to see if changing to crestor will lower LDL to goal.  Low cholesterol diet and exercise.  Follow lipid panel and liver function tests.

## 2022-06-21 ENCOUNTER — Other Ambulatory Visit: Payer: Self-pay | Admitting: Internal Medicine

## 2022-07-09 ENCOUNTER — Encounter (INDEPENDENT_AMBULATORY_CARE_PROVIDER_SITE_OTHER): Payer: Self-pay

## 2022-07-19 NOTE — Progress Notes (Signed)
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 Right interna carotid artery stenosis status post stent- asymptomatic as the thalamic infarct would involve the posterior circulation. Hypertension Hyperlipidemia Tobacco use disorder   1  Secondary stroke prevention as managed by PCP - ASA 81mg  and Plavix 75mg  daily -Statin.  LDL goal less than 70 - Normotensive blood pressure - Hgb A1c goal less than 7 3  Smoking cessation  4  Mediterranean diet 5  Routine exercise 6  Routine carotid ultrasound follow ups as per vascular 7  Follow up as needed.     Subjective:  Kim Love is a 66 year old female with HTN, hyperlipidemia and tobacco use who follows up for stroke.  UPDATE: Current medications:  ASA 81mg , rosuvastatin 40, amlodipine, losartan-HCTZ  Labs from August:  LDL 82, Hgb A1c 5.8 Carotid duplex from 8/14 showed 40-50% right ICA stenosis, 1-39% left ICA stenosis, antegrade flow of both VAs. Smokes 1 ppd.  Trying to cut back.   Feeling well.   HISTORY: In September 2022, she developed sudden onset double vision and feeling off balance.  Left eye did not seem to move.  She went to Urgent Care and was treated for ear infection.  She later saw her ophthalmologist who ordered an MRI of the brain, performed the following week on 06/01/2021 which revealed a small subacute infarct in the right medial thalamus and 2 punctate subacute infarcts in the left deep parietal lobe.  She was sent to [facility] where she was admitted.  CTA head and neck showed more than 70% right ICA stenosis, 50-60% proximal left ICA stenosis.  Complete 2D echocardiogram showed EF 60-65% with no cardiac source of embolus.  LDL was 104 and Hgb A1c was 5.7.  She  was not on antithrombotic therapy or statin prior to admission.  She was discharged on ASA 81mg , Plavix 75mg  and atorvastatin 40mg  daily.  She subsequently underwent outpatient right carotid stent on 06/12/2021.  2 week cardiac event monitor in December 2022 revealed no a fib.  Carotid ultrasound on 10/20/2021 showed 1-39% bilateral ICA stenosis with antegrade flow of both VAs.  he reports no residual symptoms.    PAST MEDICAL HISTORY: Past Medical History:  Diagnosis Date   Anxiety    Hypercholesterolemia    Hypertension    Tobacco abuse     MEDICATIONS: Current Outpatient Medications on File Prior to Visit  Medication Sig Dispense Refill   amLODipine (NORVASC) 5 MG tablet TAKE 1 TABLET BY MOUTH EVERY DAY 90 tablet 3   aspirin EC 81 MG tablet Take 81 mg by mouth daily. Swallow whole.     cholecalciferol (VITAMIN D3) 25 MCG (1000 UNIT) tablet Take 1,000 Units by mouth daily.     citalopram (CELEXA) 20 MG tablet TAKE 1 TABLET BY MOUTH EVERY DAY 90 tablet 1   clopidogrel (PLAVIX) 75 MG tablet TAKE 1 TABLET BY MOUTH EVERY DAY 90 tablet 1   fluticasone (FLONASE) 50 MCG/ACT nasal spray Place 2 sprays into both nostrils daily. (Patient taking differently: Place 2 sprays into both nostrils as needed.) 16 g 6   losartan-hydrochlorothiazide (HYZAAR) 100-25 MG tablet TAKE 1 TABLET BY MOUTH EVERY DAY 90 tablet 3   potassium chloride (KLOR-CON M) 10 MEQ tablet Take 10 mEq by mouth  once.     rosuvastatin (CRESTOR) 40 MG tablet Take 1 tablet (40 mg total) by mouth daily. 90 tablet 1   vitamin B-12 (CYANOCOBALAMIN) 1000 MCG tablet Take 1,000 mcg by mouth daily.     No current facility-administered medications on file prior to visit.    ALLERGIES: Allergies  Allergen Reactions   Povidone-Iodine Swelling   Sulfamethoxazole-Trimethoprim Nausea And Vomiting   Atenolol Other (See Comments)    Bradycardia   Codeine     REACTION: "gallbladder issues"   Erythromycin     REACTION: nausea   Erythromycin      Upset stomach   Olmesartan     Other reaction(s): Other (See Comments) Increased BP.   Sulfa Antibiotics    Codeine     Gallbladder issues    FAMILY HISTORY: Family History  Problem Relation Age of Onset   Stroke Maternal Grandmother    Stroke Maternal Grandfather       Objective:  Blood pressure (!) 145/78, pulse 84, height 5\' 2"  (1.575 m), weight 133 lb 9.6 oz (60.6 kg), SpO2 98 %. General: No acute distress.  Patient appears well-groomed.   Head:  Normocephalic/atraumatic Eyes:  Fundi examined but not visualized Heart:  Regular rate and rhythm Neurological Exam: alert and oriented to person, place, and time.  Speech fluent and not dysarthric, language intact.  CN II-XII intact. Bulk and tone normal, muscle strength 5/5 throughout.  Sensation to temperature and vibration intact.  Deep tendon reflexes 2+ throughout.  Finger to nose testing intact.  Gait normal, able to turn and tandem walk.  Romberg negative.    , DO  CC: Shon Millet, MD

## 2022-07-23 ENCOUNTER — Encounter: Payer: Self-pay | Admitting: Neurology

## 2022-07-23 ENCOUNTER — Ambulatory Visit: Payer: Medicare Other | Admitting: Neurology

## 2022-07-23 VITALS — BP 145/78 | HR 84 | Ht 62.0 in | Wt 133.6 lb

## 2022-07-23 DIAGNOSIS — I10 Essential (primary) hypertension: Secondary | ICD-10-CM

## 2022-07-23 DIAGNOSIS — I639 Cerebral infarction, unspecified: Secondary | ICD-10-CM | POA: Diagnosis not present

## 2022-07-23 DIAGNOSIS — E785 Hyperlipidemia, unspecified: Secondary | ICD-10-CM

## 2022-07-23 DIAGNOSIS — Z720 Tobacco use: Secondary | ICD-10-CM

## 2022-07-23 DIAGNOSIS — I6521 Occlusion and stenosis of right carotid artery: Secondary | ICD-10-CM | POA: Diagnosis not present

## 2022-08-03 ENCOUNTER — Other Ambulatory Visit: Payer: Self-pay | Admitting: Cardiovascular Disease

## 2022-08-15 ENCOUNTER — Encounter: Payer: Self-pay | Admitting: Internal Medicine

## 2022-08-15 ENCOUNTER — Ambulatory Visit (INDEPENDENT_AMBULATORY_CARE_PROVIDER_SITE_OTHER): Payer: Medicare Other | Admitting: Internal Medicine

## 2022-08-15 ENCOUNTER — Telehealth: Payer: Self-pay

## 2022-08-15 VITALS — BP 128/84 | HR 73 | Temp 98.1°F | Resp 14 | Ht 62.0 in | Wt 134.8 lb

## 2022-08-15 DIAGNOSIS — F419 Anxiety disorder, unspecified: Secondary | ICD-10-CM

## 2022-08-15 DIAGNOSIS — I10 Essential (primary) hypertension: Secondary | ICD-10-CM

## 2022-08-15 DIAGNOSIS — E559 Vitamin D deficiency, unspecified: Secondary | ICD-10-CM

## 2022-08-15 DIAGNOSIS — Z0000 Encounter for general adult medical examination without abnormal findings: Secondary | ICD-10-CM

## 2022-08-15 DIAGNOSIS — E538 Deficiency of other specified B group vitamins: Secondary | ICD-10-CM

## 2022-08-15 DIAGNOSIS — I700 Atherosclerosis of aorta: Secondary | ICD-10-CM

## 2022-08-15 DIAGNOSIS — I6523 Occlusion and stenosis of bilateral carotid arteries: Secondary | ICD-10-CM

## 2022-08-15 DIAGNOSIS — E7800 Pure hypercholesterolemia, unspecified: Secondary | ICD-10-CM | POA: Diagnosis not present

## 2022-08-15 DIAGNOSIS — E2839 Other primary ovarian failure: Secondary | ICD-10-CM

## 2022-08-15 DIAGNOSIS — Z1211 Encounter for screening for malignant neoplasm of colon: Secondary | ICD-10-CM

## 2022-08-15 DIAGNOSIS — I739 Peripheral vascular disease, unspecified: Secondary | ICD-10-CM

## 2022-08-15 DIAGNOSIS — R739 Hyperglycemia, unspecified: Secondary | ICD-10-CM | POA: Diagnosis not present

## 2022-08-15 NOTE — Telephone Encounter (Signed)
Pt advised DEXA sched - 10/18/22 9 am

## 2022-08-15 NOTE — Progress Notes (Signed)
Patient ID: Kim Love, female   DOB: July 18, 1956, 66 y.o.   MRN: [id number]   Subjective:    Patient ID: Kim Love, female    DOB: 03/19/1956, 66 y.o.   MRN: [id number]   Patient here for  Chief Complaint  Patient presents with   Annual Exam    CPE   .   HPI Here for her physical.  Saw Dr Tomi Likens 07/23/22 - f/u hisotry of CVA. No changes made.  Stable.  Changed lipitor to crestor last visit.  Reports she is doing relatively well.  Increased stress.  Family stress.  Discussed.  On celexa.  Will let me know if feels she needs any further intervention.  No chest pain or sob reported.  No abdominal pain or bowel change reported.  No cough or congestion.     Past Medical History:  Diagnosis Date   Anxiety    Hypercholesterolemia    Hypertension    Tobacco abuse    Past Surgical History:  Procedure Laterality Date   CAROTID PTA/STENT INTERVENTION Right 06/12/2021   Procedure: CAROTID PTA/STENT INTERVENTION;  Surgeon: Algernon Huxley, MD;  Location: [facility];  Service: Cardiovascular;  Laterality: Right;   TONSILLECTOMY AND ADENOIDECTOMY Bilateral 1980   Family History  Problem Relation Age of Onset   Stroke Maternal Grandmother    Stroke Maternal Grandfather    Social History   Socioeconomic History   Marital status: Married    Spouse name: Doctor, general practice   Number of children: 1   Years of education: Not on file   Highest education level: Not on file  Occupational History    Employer: [employer]  Tobacco Use   Smoking status: Every Day    Packs/day: 1.00    Years: 30.00    Total pack years: 30.00    Types: Cigarettes    Last attempt to quit: 09/10/2012    Years since quitting: 9.9   Smokeless tobacco: Never  Vaping Use   Vaping Use: Never used  Substance and Sexual Activity   Alcohol use: No    Comment: "once in a blue moon: I can't remember the last time."   Drug use: No   Sexual activity: Not on file  Other Topics Concern   Not on file  Social History  Narrative   Lives at home with spouse       Right handed   Social Determinants of Health   Financial Resource Strain: Not on file  Food Insecurity: Not on file  Transportation Needs: Not on file  Physical Activity: Not on file  Stress: Not on file  Social Connections: Not on file     Review of Systems  Constitutional:  Negative for appetite change and unexpected weight change.  HENT:  Negative for congestion, sinus pressure and sore throat.   Eyes:  Negative for pain and visual disturbance.  Respiratory:  Negative for cough, chest tightness and shortness of breath.   Cardiovascular:  Negative for chest pain, palpitations and leg swelling.  Gastrointestinal:  Negative for abdominal pain, diarrhea, nausea and vomiting.  Genitourinary:  Negative for difficulty urinating and dysuria.  Musculoskeletal:  Negative for joint swelling and myalgias.  Skin:  Negative for color change and rash.  Neurological:  Negative for dizziness and headaches.  Hematological:  Negative for adenopathy. Does not bruise/bleed easily.  Psychiatric/Behavioral:  Negative for agitation and dysphoric mood.        Objective:     BP 128/84  Pulse 73   Temp 98.1 F (36.7 C) (Temporal)   Resp 14   Ht _0  (1.575 m)   Wt 134 lb 12.8 oz (61.1 kg)   SpO2 96%   BMI 24.66 kg/m  Wt Readings from Last 3 Encounters:  08/15/22 134 lb 12.8 oz (61.1 kg)  07/23/22 133 lb 9.6 oz (60.6 kg)  05/18/22 133 lb 9.6 oz (60.6 kg)    Physical Exam Vitals reviewed.  Constitutional:      General: She is not in acute distress.    Appearance: Normal appearance. She is well-developed.  HENT:     Head: Normocephalic and atraumatic.     Right Ear: External ear normal.     Left Ear: External ear normal.  Eyes:     General: No scleral icterus.       Right eye: No discharge.        Left eye: No discharge.     Conjunctiva/sclera: Conjunctivae normal.  Neck:     Thyroid: No thyromegaly.  Cardiovascular:     Rate and  Rhythm: Normal rate and regular rhythm.  Pulmonary:     Effort: No tachypnea, accessory muscle usage or respiratory distress.     Breath sounds: Normal breath sounds. No decreased breath sounds or wheezing.  Chest:  Breasts:    Right: No inverted nipple, mass, nipple discharge or tenderness (no axillary adenopathy).     Left: No inverted nipple, mass, nipple discharge or tenderness (no axilarry adenopathy).  Abdominal:     General: Bowel sounds are normal.     Palpations: Abdomen is soft.     Tenderness: There is no abdominal tenderness.  Musculoskeletal:        General: No swelling or tenderness.     Cervical back: Neck supple.  Lymphadenopathy:     Cervical: No cervical adenopathy.  Skin:    Findings: No erythema or rash.  Neurological:     Mental Status: She is alert and oriented to person, place, and time.  Psychiatric:        Mood and Affect: Mood normal.        Behavior: Behavior normal.      Outpatient Encounter Medications as of 08/15/2022  Medication Sig   amLODipine (NORVASC) 5 MG tablet TAKE 1 TABLET BY MOUTH EVERY DAY   aspirin EC 81 MG tablet Take 81 mg by mouth daily. Swallow whole.   cholecalciferol (VITAMIN D3) 25 MCG (1000 UNIT) tablet Take 1,000 Units by mouth daily.   citalopram (CELEXA) 20 MG tablet TAKE 1 TABLET BY MOUTH EVERY DAY   clopidogrel (PLAVIX) 75 MG tablet TAKE 1 TABLET BY MOUTH EVERY DAY   fluticasone (FLONASE) 50 MCG/ACT nasal spray Place 2 sprays into both nostrils daily. (Patient taking differently: Place 2 sprays into both nostrils as needed.)   losartan-hydrochlorothiazide (HYZAAR) 100-25 MG tablet TAKE 1 TABLET BY MOUTH EVERY DAY   potassium chloride (KLOR-CON M) 10 MEQ tablet Take 10 mEq by mouth once.   rosuvastatin (CRESTOR) 40 MG tablet Take 1 tablet (40 mg total) by mouth daily.   vitamin B-12 (CYANOCOBALAMIN) 1000 MCG tablet Take 1,000 mcg by mouth daily.   No facility-administered encounter medications on file as of 08/15/2022.      Lab Results  Component Value Date   WBC 6.9 04/26/2022   HGB 14.4 04/26/2022   HCT 43.2 04/26/2022   PLT 244.0 04/26/2022   GLUCOSE 90 04/26/2022   CHOL 138 04/26/2022   TRIG 73.0 04/26/2022   HDL  41.50 04/26/2022   LDLDIRECT 129.0 03/26/2018   LDLCALC 82 04/26/2022   ALT 12 04/26/2022   AST 15 04/26/2022   NA 140 04/26/2022   K 3.6 04/26/2022   CL 100 04/26/2022   CREATININE 0.80 04/26/2022   BUN 13 04/26/2022   CO2 29 04/26/2022   TSH 1.03 12/12/2021   INR 1.0 06/01/2021   HGBA1C 5.8 04/26/2022       Assessment & Plan:   Problem List Items Addressed This Visit     Anxiety    Increased stress. Discussed.  Continue citalopram.  Follow       Aortic atherosclerosis (HCC)    Continue atorvastatin.        B12 deficiency    Continues B12 supplements.  Check B12 level with next labs.        Relevant Orders   Vitamin B12   Carotid artery disease (HCC)    Saw Dr Lucky Cowboy - 04/2022 - mild stenosis - right carotid stent and 1-39% left carotid stenosis.  Continue aspirin and plavix and statin.  Recommended f/u in 6 months.       Essential hypertension, benign    On losartan/hctz and amlodipine.  Blood pressure as outlined.  Follow pressures.  Follow metabolic panel.       Health care maintenance    Physical today 08/15/22.  Sees gyn.  PAP 01/13/20 (negative).  Performed by Dr Ouida Sills - [facility]. Mammogram 02/12/22 - Birads I.  Colonoscopy 02/2015.  Refer to GI - f/u colonoscopy.        HTN (hypertension)   Relevant Orders   Basic metabolic panel   Hypercholesterolemia    On crestor 78m q day.  Low cholesterol diet and exercise.  Follow lipid panel and liver function tests.        Relevant Orders   Lipid panel   Hepatic function panel   CBC with Differential/Platelet   TSH   Hyperglycemia    Low carb diet and exercise.  Follow met b and a1c.       Relevant Orders   Hemoglobin A1c   PAD (peripheral artery disease) (HCC)    S/p stent (carotid) -  followed by vascular surgery.  Continue risk factor modification.       Vitamin D deficiency    Check vitamin D level next labs.       Relevant Orders   VITAMIN D 25 Hydroxy (Vit-D Deficiency, Fractures)   Other Visit Diagnoses     Routine general medical examination at a health care facility    -  Primary   Estrogen deficiency       Relevant Orders   DG Bone Density   Colon cancer screening       Relevant Orders   Ambulatory referral to Gastroenterology        CEinar Pheasant MD

## 2022-08-15 NOTE — Assessment & Plan Note (Addendum)
Physical today 08/15/22.  Sees gyn.  PAP 01/13/20 (negative).  Performed by Dr Dareen Piano - [facility]. Mammogram 02/12/22 - Birads I.  Colonoscopy 02/2015.  Refer to GI - f/u colonoscopy.

## 2022-08-16 ENCOUNTER — Other Ambulatory Visit: Payer: Self-pay | Admitting: Internal Medicine

## 2022-08-16 ENCOUNTER — Other Ambulatory Visit: Payer: Self-pay | Admitting: Cardiovascular Disease

## 2022-08-19 ENCOUNTER — Encounter: Payer: Self-pay | Admitting: Internal Medicine

## 2022-08-19 NOTE — Assessment & Plan Note (Signed)
S/p stent (carotid) - followed by vascular surgery.  Continue risk factor modification.  

## 2022-08-19 NOTE — Assessment & Plan Note (Signed)
On crestor 40mg q day.  Low cholesterol diet and exercise.  Follow lipid panel and liver function tests.   ?

## 2022-08-19 NOTE — Assessment & Plan Note (Signed)
Reviewed labs.  hgb decreased last check.  Recheck cbc and check iron studies and b12.

## 2022-08-19 NOTE — Assessment & Plan Note (Signed)
Saw Dr Wyn Quaker - 04/2022 - mild stenosis - right carotid stent and 1-39% left carotid stenosis.  Continue aspirin and plavix and statin.  Recommended f/u in 6 months.

## 2022-08-19 NOTE — Assessment & Plan Note (Signed)
Continues B12 supplements.  Check B12 level with next labs.

## 2022-08-19 NOTE — Assessment & Plan Note (Signed)
On losartan/hctz and amlodipine.  Blood pressure as outlined.  Follow pressures.  Follow metabolic panel.  

## 2022-08-19 NOTE — Assessment & Plan Note (Signed)
Increased stress. Discussed.  Continue citalopram.  Follow

## 2022-08-19 NOTE — Assessment & Plan Note (Signed)
Continue atorvastatin

## 2022-08-19 NOTE — Assessment & Plan Note (Signed)
Check vitamin D level next labs.

## 2022-08-19 NOTE — Assessment & Plan Note (Signed)
Low carb diet and exercise.  Follow met b and a1c.  

## 2022-08-27 ENCOUNTER — Other Ambulatory Visit (INDEPENDENT_AMBULATORY_CARE_PROVIDER_SITE_OTHER): Payer: Self-pay | Admitting: Nurse Practitioner

## 2022-09-30 ENCOUNTER — Other Ambulatory Visit: Payer: Self-pay | Admitting: Internal Medicine

## 2022-10-18 ENCOUNTER — Ambulatory Visit
Admission: RE | Admit: 2022-10-18 | Discharge: 2022-10-18 | Disposition: A | Discharge status: Home / Self Care | Payer: Medicare Other | Source: Ambulatory Visit | Attending: Internal Medicine | Admitting: Internal Medicine

## 2022-10-18 DIAGNOSIS — M810 Age-related osteoporosis without current pathological fracture: Secondary | ICD-10-CM | POA: Diagnosis not present

## 2022-10-18 DIAGNOSIS — E2839 Other primary ovarian failure: Secondary | ICD-10-CM | POA: Insufficient documentation

## 2022-10-19 ENCOUNTER — Telehealth: Payer: Self-pay

## 2022-10-19 NOTE — Telephone Encounter (Signed)
Just need to know what dose of calcium and vit D for her to start.

## 2022-10-19 NOTE — Telephone Encounter (Signed)
Recommend oscal 682m plus vitamin D bid.

## 2022-10-19 NOTE — Telephone Encounter (Signed)
-----   Message from Einar Pheasant, MD sent at 10/19/2022  5:12 AM EST ----- Notify - bone density reveals osteoporosis.  Recommend calcium, vitamin D and weight bearing exercise.  Also, can discuss further treatment options at her upcoming appt. Please make note on schedule.

## 2022-10-19 NOTE — Telephone Encounter (Signed)
Patient returned office phone call note was read. Patient would like to know how much Calcium and victim D she should take dailey.

## 2022-10-19 NOTE — Telephone Encounter (Signed)
Called patient for bone density results. Unable to leave message.

## 2022-10-22 NOTE — Telephone Encounter (Signed)
Pt aware of below.

## 2022-10-31 ENCOUNTER — Other Ambulatory Visit (INDEPENDENT_AMBULATORY_CARE_PROVIDER_SITE_OTHER): Payer: Self-pay | Admitting: Vascular Surgery

## 2022-10-31 DIAGNOSIS — I6523 Occlusion and stenosis of bilateral carotid arteries: Secondary | ICD-10-CM

## 2022-11-02 ENCOUNTER — Ambulatory Visit (INDEPENDENT_AMBULATORY_CARE_PROVIDER_SITE_OTHER): Payer: Medicare Other

## 2022-11-02 ENCOUNTER — Ambulatory Visit (INDEPENDENT_AMBULATORY_CARE_PROVIDER_SITE_OTHER): Payer: Medicare Other | Admitting: Vascular Surgery

## 2022-11-02 ENCOUNTER — Encounter (INDEPENDENT_AMBULATORY_CARE_PROVIDER_SITE_OTHER): Payer: Self-pay | Admitting: Vascular Surgery

## 2022-11-02 VITALS — BP 130/73 | HR 72 | Resp 16 | Wt 136.4 lb

## 2022-11-02 DIAGNOSIS — I10 Essential (primary) hypertension: Secondary | ICD-10-CM | POA: Diagnosis not present

## 2022-11-02 DIAGNOSIS — I63239 Cerebral infarction due to unspecified occlusion or stenosis of unspecified carotid arteries: Secondary | ICD-10-CM

## 2022-11-02 DIAGNOSIS — I6523 Occlusion and stenosis of bilateral carotid arteries: Secondary | ICD-10-CM

## 2022-11-02 DIAGNOSIS — E785 Hyperlipidemia, unspecified: Secondary | ICD-10-CM

## 2022-11-02 NOTE — Assessment & Plan Note (Signed)
Carotid duplex today reveals a widely patent right carotid artery stent without significant recurrent stenosis and stable 1 to 39% left ICA stenosis.  Doing well.  Continue current medical regimen with aspirin, Plavix, and statin agent.  Okay to come off of Plavix for up to a week for colonoscopy or eye surgery that Kim Love needs in the near future.  We can now go to an annual follow-up with duplex.

## 2022-11-02 NOTE — Progress Notes (Signed)
MRN : [id number]  Kim Love is a 67 y.o. ([birth year]) female who presents with chief complaint of  Chief Complaint  Patient presents with   Follow-up    Ultrasound follow up  .  History of Present Illness: Patient returns in follow-up of her carotid disease.  About 15 months ago, she underwent right carotid artery stent placement for high-grade stenosis with previous stroke.  She is doing quite well.  No recurrent focal neurologic symptoms since her procedure.  She is tolerating dual antiplatelet therapy reasonably well although she does need to have a colonoscopy and an procedure in the near future.  No new complaints today. Carotid duplex today reveals a widely patent right carotid artery stent without significant recurrent stenosis and stable 1 to 39% left ICA stenosis.  Current Outpatient Medications  Medication Sig Dispense Refill   amLODipine (NORVASC) 5 MG tablet TAKE 1 TABLET BY MOUTH EVERY DAY 90 tablet 3   aspirin EC 81 MG tablet Take 81 mg by mouth daily. Swallow whole.     cholecalciferol (VITAMIN D3) 25 MCG (1000 UNIT) tablet Take 1,000 Units by mouth daily.     citalopram (CELEXA) 20 MG tablet TAKE 1 TABLET BY MOUTH EVERY DAY 90 tablet 1   clopidogrel (PLAVIX) 75 MG tablet TAKE 1 TABLET BY MOUTH EVERY DAY 90 tablet 1   fluticasone (FLONASE) 50 MCG/ACT nasal spray Place 2 sprays into both nostrils daily. (Patient taking differently: Place 2 sprays into both nostrils as needed.) 16 g 6   losartan-hydrochlorothiazide (HYZAAR) 100-25 MG tablet TAKE 1 TABLET BY MOUTH EVERY DAY 90 tablet 3   potassium chloride (KLOR-CON M) 10 MEQ tablet Take 10 mEq by mouth once.     potassium chloride (KLOR-CON) 10 MEQ tablet TAKE 1 TABLET BY MOUTH EVERY DAY 90 tablet 3   rosuvastatin (CRESTOR) 40 MG tablet Take 1 tablet (40 mg total) by mouth daily. 90 tablet 1   vitamin B-12 (CYANOCOBALAMIN) 1000 MCG tablet Take 1,000 mcg by mouth daily.     No current facility-administered medications for  this visit.    Past Medical History:  Diagnosis Date   Anxiety    Hypercholesterolemia    Hypertension    Tobacco abuse     Past Surgical History:  Procedure Laterality Date   CAROTID PTA/STENT INTERVENTION Right 06/12/2021   Procedure: CAROTID PTA/STENT INTERVENTION;  Surgeon: Algernon Huxley, MD;  Location: [facility];  Service: Cardiovascular;  Laterality: Right;   TONSILLECTOMY AND ADENOIDECTOMY Bilateral 1980     Social History   Tobacco Use   Smoking status: Every Day    Packs/day: 1.00    Years: 30.00    Total pack years: 30.00    Types: Cigarettes    Last attempt to quit: 09/10/2012    Years since quitting: 10.1   Smokeless tobacco: Never  Vaping Use   Vaping Use: Never used  Substance Use Topics   Alcohol use: No    Comment: "once in a blue moon: I can't remember the last time."   Drug use: No      Family History  Problem Relation Age of Onset   Stroke Maternal Grandmother    Stroke Maternal Grandfather   No bleeding or clotting disorders  Allergies  Allergen Reactions   Povidone-Iodine Swelling   Sulfamethoxazole-Trimethoprim Nausea And Vomiting   Atenolol Other (See Comments)    Bradycardia   Codeine     REACTION: "gallbladder issues"   Erythromycin  REACTION: nausea   Erythromycin     Upset stomach   Olmesartan     Other reaction(s): Other (See Comments) Increased BP.   Sulfa Antibiotics    Codeine     Gallbladder issues     REVIEW OF SYSTEMS (Negative unless checked)   Constitutional: '[]'$ Weight loss  '[]'$ Fever  '[]'$ Chills Cardiac: '[]'$ Chest pain   '[]'$ Chest pressure   '[]'$ Palpitations   '[]'$ Shortness of breath when laying flat   '[]'$ Shortness of breath at rest   '[]'$ Shortness of breath with exertion. Vascular:  '[]'$ Pain in legs with walking   '[]'$ Pain in legs at rest   '[]'$ Pain in legs when laying flat   '[]'$ Claudication   '[]'$ Pain in feet when walking  '[]'$ Pain in feet at rest  '[]'$ Pain in feet when laying flat   '[]'$ History of DVT   '[]'$ Phlebitis   '[]'$ Swelling  in legs   '[]'$ Varicose veins   '[]'$ Non-healing ulcers Pulmonary:   '[]'$ Uses home oxygen   '[]'$ Productive cough   '[]'$ Hemoptysis   '[]'$ Wheeze  '[]'$ COPD   '[]'$ Asthma Neurologic:  '[]'$ Dizziness  '[]'$ Blackouts   '[]'$ Seizures   '[x]'$ History of stroke   '[x]'$ History of TIA  '[]'$ Aphasia   '[x]'$ Temporary blindness   '[]'$ Dysphagia   '[]'$ Weakness or numbness in arms   '[]'$ Weakness or numbness in legs Musculoskeletal:  '[]'$ Arthritis   '[]'$ Joint swelling   '[]'$ Joint pain   '[]'$ Low back pain Hematologic:  '[]'$ Easy bruising  '[]'$ Easy bleeding   '[]'$ Hypercoagulable state   '[]'$ Anemic   Gastrointestinal:  '[]'$ Blood in stool   '[]'$ Vomiting blood  '[]'$ Gastroesophageal reflux/heartburn   '[]'$ Abdominal pain Genitourinary:  '[]'$ Chronic kidney disease   '[]'$ Difficult urination  '[]'$ Frequent urination  '[]'$ Burning with urination   '[]'$ Hematuria Skin:  '[]'$ Rashes   '[]'$ Ulcers   '[]'$ Wounds Psychological:  '[x]'$ History of anxiety   '[]'$  History of major depression.  Physical Examination  Vitals:   11/02/22 0840  BP: 130/73  Pulse: 72  Resp: 16  Weight: 136 lb 6.4 oz (61.9 kg)   Body mass index is 24.95 kg/m. Gen:  WD/WN, NAD Head: [place]/AT, No temporalis wasting. Ear/Nose/Throat: Hearing grossly intact, nares w/o erythema or drainage, trachea midline Eyes: Conjunctiva clear. Sclera non-icteric Neck: Supple.  No bruit  Pulmonary:  Good air movement, equal and clear to auscultation bilaterally.  Cardiac: RRR, No JVD Vascular:  Vessel Right Left  Radial Palpable Palpable           Musculoskeletal: M/S 5/5 throughout.  No deformity or atrophy. No edema. Neurologic: CN 2-12 intact. Sensation grossly intact in extremities.  Symmetrical.  Speech is fluent. Motor exam as listed above. Psychiatric: Judgment intact, Mood & affect appropriate for pt's clinical situation. Dermatologic: No rashes or ulcers noted.  No cellulitis or open wounds.    CBC Lab Results  Component Value Date   WBC 6.9 04/26/2022   HGB 14.4 04/26/2022   HCT 43.2 04/26/2022   MCV 90.8 04/26/2022   PLT 244.0  04/26/2022    BMET    Component Value Date/Time   NA 140 04/26/2022 0745   NA 139 07/30/2012 1247   K 3.6 04/26/2022 0745   K 3.2 (L) 07/30/2012 1247   CL 100 04/26/2022 0745   CL 105 07/30/2012 1247   CO2 29 04/26/2022 0745   CO2 26 07/30/2012 1247   GLUCOSE 90 04/26/2022 0745   GLUCOSE 106 (H) 07/30/2012 1247   BUN 13 04/26/2022 0745   BUN 14 07/30/2012 1247   CREATININE 0.80 04/26/2022 0745   CREATININE 0.72 07/30/2012 1247   CALCIUM 8.9 04/26/2022 0745  CALCIUM 8.9 07/30/2012 1247   GFRNONAA >60 06/13/2021 0605   GFRNONAA >60 07/30/2012 1247   GFRAA >60 07/30/2012 1247   CrCl cannot be calculated (Patient's most recent lab result is older than the maximum 21 days allowed.).  COAG Lab Results  Component Value Date   INR 1.0 06/01/2021   INR 1.0 05/28/2021    Radiology DG Bone Density  Result Date: 10/18/2022 EXAM: DUAL X-RAY ABSORPTIOMETRY (DXA) FOR BONE MINERAL DENSITY IMPRESSION: Your patient Malia Bem completed a BMD test on 10/18/2022 using the [employer] (software version: 14.10) manufactured by [employer]. The following summarizes the results of our evaluation. Technologist::TNB PATIENT BIOGRAPHICAL: Name: Josephina, Hattar Patient ID: [id number] Birth Date: 11-05-1955 Height: 62.0 in. Gender: Female Exam Date: 10/18/2022 Weight: 134.8 lbs. Indications: Caucasian, Low Calcium Intake, Postmenopausal, Tobacco User, Vitamin D Deficiency Fractures: Treatments: Vitamin D DENSITOMETRY RESULTS: Site         Region     Measured Date Measured Age WHO Classification Young Adult T-score BMD         %Change vs. Previous Significant Change (*) DualFemur Neck Right 10/18/20[age].4 Osteopenia -2.1 0.743 g/cm2 - - Left Forearm Radius 33% 10/18/2022 65.4 Osteoporosis -2.6 0.645 g/cm2 - - ASSESSMENT: The BMD measured at Forearm Radius 33% is 0.645 g/cm2 with a T-score of -2.6. This patient is considered osteoporotic according to [facility])  criteria. Lumbar spine was not utilized due to advanced degenerative changes. Patient is not a candidate for FRAX due to diagnosis of osteoporosis. The scan quality is good. [facility] Pharmacologist [facility]) criteria for post-menopausal, Caucasian Women: Normal:                   T-score at or above -1 SD Osteopenia/low bone mass: T-score between -1 and -2.5 SD Osteoporosis:             T-score at or below -2.5 SD RECOMMENDATIONS: 1. All patients should optimize calcium and vitamin D intake. 2. Consider FDA-approved medical therapies in postmenopausal women and men aged 64 years and older, based on the following: a. A hip or vertebral(clinical or morphometric) fracture b. T-score < -2.5 at the femoral neck or spine after appropriate evaluation to exclude secondary causes c. Low bone mass (T-score between -1.0 and -2.5 at the femoral neck or spine) and a 10-year probability of a hip fracture > 3% or a 10-year probability of a major osteoporosis-related fracture > 20% based on the [place]-adapted WHO algorithm 3. Clinician judgment and/or patient preferences may indicate treatment for people with 10-year fracture probabilities above or below these levels FOLLOW-UP: People with diagnosed cases of osteoporosis or at high risk for fracture should have regular bone mineral density tests. For patients eligible for Medicare, routine testing is allowed once every 2 years. The testing frequency can be increased to one year for patients who have rapidly progressing disease, those who are receiving or discontinuing medical therapy to restore bone mass, or have additional risk factors. I have reviewed this report, and agree with the above findings. [facility] Electronically Signed   By: Zerita Boers M.D.   On: 10/18/2022 09:25     Assessment/Plan Carotid stenosis, symptomatic, with infarction [facility]) Carotid duplex today reveals a widely patent right carotid artery stent without significant recurrent stenosis and  stable 1 to 39% left ICA stenosis.  Doing well.  Continue current medical regimen with aspirin, Plavix, and statin agent.  Okay to come off of Plavix  for up to a week for colonoscopy or eye surgery that she needs in the near future.  We can now go to an annual follow-up with duplex.  Essential hypertension, benign blood pressure control important in reducing the progression of atherosclerotic disease. On appropriate oral medications.     HLD (hyperlipidemia) lipid control important in reducing the progression of atherosclerotic disease. Continue statin therapy  Leotis Pain, MD  11/02/2022 9:00 AM    This note was created with Dragon medical transcription system.  Any errors from dictation are purely unintentional

## 2022-11-09 ENCOUNTER — Other Ambulatory Visit (INDEPENDENT_AMBULATORY_CARE_PROVIDER_SITE_OTHER): Payer: Medicare Other

## 2022-11-09 DIAGNOSIS — E538 Deficiency of other specified B group vitamins: Secondary | ICD-10-CM | POA: Diagnosis not present

## 2022-11-09 DIAGNOSIS — E559 Vitamin D deficiency, unspecified: Secondary | ICD-10-CM

## 2022-11-09 DIAGNOSIS — I10 Essential (primary) hypertension: Secondary | ICD-10-CM

## 2022-11-09 DIAGNOSIS — R739 Hyperglycemia, unspecified: Secondary | ICD-10-CM | POA: Diagnosis not present

## 2022-11-09 DIAGNOSIS — E7800 Pure hypercholesterolemia, unspecified: Secondary | ICD-10-CM | POA: Diagnosis not present

## 2022-11-09 LAB — LIPID PANEL
Cholesterol: 102 mg/dL (ref 0–200)
HDL: 42.4 mg/dL (ref >39.00)
LDL Cholesterol: 47 mg/dL (ref 0–99)
NonHDL: 59.36
Total CHOL/HDL Ratio: 2
Triglycerides: 63 mg/dL (ref 0.0–149.0)
VLDL: 12.6 mg/dL (ref 0.0–40.0)

## 2022-11-09 LAB — CBC WITH DIFFERENTIAL/PLATELET
Basophils Absolute: 0.1 10*3/uL (ref 0.0–0.1)
Basophils Relative: 0.8 % (ref 0.0–3.0)
Eosinophils Absolute: 0.4 10*3/uL (ref 0.0–0.7)
Eosinophils Relative: 5.7 % — ABNORMAL HIGH (ref 0.0–5.0)
HCT: 41.7 % (ref 36.0–46.0)
Hemoglobin: 14.3 g/dL (ref 12.0–15.0)
Lymphocytes Relative: 23.4 % (ref 12.0–46.0)
Lymphs Abs: 1.5 10*3/uL (ref 0.7–4.0)
MCHC: 34.3 g/dL (ref 30.0–36.0)
MCV: 90.6 fl (ref 78.0–100.0)
Monocytes Absolute: 0.4 10*3/uL (ref 0.1–1.0)
Monocytes Relative: 7.1 % (ref 3.0–12.0)
Neutro Abs: 4 10*3/uL (ref 1.4–7.7)
Neutrophils Relative %: 63 % (ref 43.0–77.0)
Platelets: 232 10*3/uL (ref 150.0–400.0)
RBC: 4.6 Mil/uL (ref 3.87–5.11)
RDW: 13.6 % (ref 11.5–15.5)
WBC: 6.3 10*3/uL (ref 4.0–10.5)

## 2022-11-09 LAB — HEPATIC FUNCTION PANEL
ALT: 13 U/L (ref 0–35)
AST: 14 U/L (ref 0–37)
Albumin: 3.8 g/dL (ref 3.5–5.2)
Alkaline Phosphatase: 75 U/L (ref 39–117)
Bilirubin, Direct: 0.1 mg/dL (ref 0.0–0.3)
Total Bilirubin: 0.5 mg/dL (ref 0.2–1.2)
Total Protein: 6.7 g/dL (ref 6.0–8.3)

## 2022-11-09 LAB — TSH: TSH: 1.92 u[IU]/mL (ref 0.35–5.50)

## 2022-11-09 LAB — BASIC METABOLIC PANEL
BUN: 14 mg/dL (ref 6–23)
CO2: 33 mEq/L — ABNORMAL HIGH (ref 19–32)
Calcium: 9.1 mg/dL (ref 8.4–10.5)
Chloride: 104 mEq/L (ref 96–112)
Creatinine, Ser: 0.85 mg/dL (ref 0.40–1.20)
GFR: 71.87 mL/min (ref >60.00)
Glucose, Bld: 89 mg/dL (ref 70–99)
Potassium: 4 mEq/L (ref 3.5–5.1)
Sodium: 144 mEq/L (ref 135–145)

## 2022-11-09 LAB — VITAMIN B12: Vitamin B-12: 679 pg/mL (ref 211–911)

## 2022-11-09 LAB — VITAMIN D 25 HYDROXY (VIT D DEFICIENCY, FRACTURES): VITD: 77.45 ng/mL (ref 30.00–100.00)

## 2022-11-09 LAB — HEMOGLOBIN A1C: Hgb A1c MFr Bld: 5.7 % (ref 4.6–6.5)

## 2022-11-10 ENCOUNTER — Other Ambulatory Visit: Payer: Self-pay | Admitting: Internal Medicine

## 2022-11-14 ENCOUNTER — Encounter: Payer: Self-pay | Admitting: Internal Medicine

## 2022-11-14 ENCOUNTER — Ambulatory Visit (INDEPENDENT_AMBULATORY_CARE_PROVIDER_SITE_OTHER): Payer: Medicare Other | Admitting: Internal Medicine

## 2022-11-14 VITALS — BP 118/70 | HR 67 | Temp 97.9°F | Ht 62.0 in | Wt 134.8 lb

## 2022-11-14 DIAGNOSIS — F419 Anxiety disorder, unspecified: Secondary | ICD-10-CM | POA: Diagnosis not present

## 2022-11-14 DIAGNOSIS — I10 Essential (primary) hypertension: Secondary | ICD-10-CM

## 2022-11-14 DIAGNOSIS — I700 Atherosclerosis of aorta: Secondary | ICD-10-CM | POA: Diagnosis not present

## 2022-11-14 DIAGNOSIS — E7800 Pure hypercholesterolemia, unspecified: Secondary | ICD-10-CM

## 2022-11-14 DIAGNOSIS — I6523 Occlusion and stenosis of bilateral carotid arteries: Secondary | ICD-10-CM

## 2022-11-14 DIAGNOSIS — R739 Hyperglycemia, unspecified: Secondary | ICD-10-CM

## 2022-11-14 DIAGNOSIS — M810 Age-related osteoporosis without current pathological fracture: Secondary | ICD-10-CM

## 2022-11-14 DIAGNOSIS — I739 Peripheral vascular disease, unspecified: Secondary | ICD-10-CM

## 2022-11-14 NOTE — Progress Notes (Signed)
Subjective:    Patient ID: Kim Love, female    DOB: [birth year], 67 y.o.   MRN: [id number]  Patient here for  Chief Complaint  Patient presents with   Medical Management of Chronic Issues    3 mo follow up    HPI Here to follow up regarding increased stress, hypertension and hypercholesterolemia.  On celexa. Follow up with Dr Lucky Cowboy 11/02/22 - Carotid duplex revealed a widely patent right carotid artery stent without significant recurrent stenosis and stable 1 to 39% left ICA stenosis. Recommended annual f/u. Tries to stay active.  No chest pain or sob reported.  No abdominal pain or bowel change reported.  Discussed bone density and osteoporosis.  Discussed treatment options.  Elected - calcium and vitamin D and weight bearing exercise.  Wanted to hold on prescription medication.     Past Medical History:  Diagnosis Date   Anxiety    Hypercholesterolemia    Hypertension    Tobacco abuse    Past Surgical History:  Procedure Laterality Date   CAROTID PTA/STENT INTERVENTION Right 06/12/2021   Procedure: CAROTID PTA/STENT INTERVENTION;  Surgeon: Algernon Huxley, MD;  Location: [facility];  Service: Cardiovascular;  Laterality: Right;   TONSILLECTOMY AND ADENOIDECTOMY Bilateral 1980   Family History  Problem Relation Age of Onset   Stroke Maternal Grandmother    Stroke Maternal Grandfather    Social History   Socioeconomic History   Marital status: Married    Spouse name: Doctor, general practice   Number of children: 1   Years of education: Not on file   Highest education level: Not on file  Occupational History    Employer: [employer]  Tobacco Use   Smoking status: Every Day    Packs/day: 1.00    Years: 30.00    Total pack years: 30.00    Types: Cigarettes    Last attempt to quit: 09/10/2012    Years since quitting: 10.1   Smokeless tobacco: Never  Vaping Use   Vaping Use: Never used  Substance and Sexual Activity   Alcohol use: No    Comment: "once in a blue moon: I can't  remember the last time."   Drug use: No   Sexual activity: Not on file  Other Topics Concern   Not on file  Social History Narrative   Lives at home with spouse       Right handed   Social Determinants of Health   Financial Resource Strain: Not on file  Food Insecurity: Not on file  Transportation Needs: Not on file  Physical Activity: Not on file  Stress: Not on file  Social Connections: Not on file     Review of Systems  Constitutional:  Negative for appetite change and unexpected weight change.  HENT:  Negative for congestion and sinus pressure.   Respiratory:  Negative for cough, chest tightness and shortness of breath.   Cardiovascular:  Negative for chest pain and palpitations.  Gastrointestinal:  Negative for abdominal pain, diarrhea, nausea and vomiting.  Genitourinary:  Negative for difficulty urinating and dysuria.  Musculoskeletal:  Negative for joint swelling and myalgias.  Skin:  Negative for color change and rash.  Neurological:  Negative for dizziness and headaches.  Psychiatric/Behavioral:  Negative for agitation and dysphoric mood.        Objective:     BP 118/70   Pulse 67   Temp 97.9 F (36.6 C) (Oral)   Ht 5\' 2"  (1.575 m)   Wt  134 lb 12.8 oz (61.1 kg)   SpO2 98%   BMI 24.66 kg/m  Wt Readings from Last 3 Encounters:  11/14/22 134 lb 12.8 oz (61.1 kg)  11/02/22 136 lb 6.4 oz (61.9 kg)  08/15/22 134 lb 12.8 oz (61.1 kg)    Physical Exam Vitals reviewed.  Constitutional:      General: She is not in acute distress.    Appearance: Normal appearance.  HENT:     Head: Normocephalic and atraumatic.     Right Ear: External ear normal.     Left Ear: External ear normal.  Eyes:     General: No scleral icterus.       Right eye: No discharge.        Left eye: No discharge.     Conjunctiva/sclera: Conjunctivae normal.  Neck:     Thyroid: No thyromegaly.  Cardiovascular:     Rate and Rhythm: Normal rate and regular rhythm.  Pulmonary:      Effort: No respiratory distress.     Breath sounds: Normal breath sounds. No wheezing.  Abdominal:     General: Bowel sounds are normal.     Palpations: Abdomen is soft.     Tenderness: There is no abdominal tenderness.  Musculoskeletal:        General: No swelling or tenderness.     Cervical back: Neck supple. No tenderness.  Lymphadenopathy:     Cervical: No cervical adenopathy.  Skin:    Findings: No erythema or rash.  Neurological:     Mental Status: She is alert.  Psychiatric:        Mood and Affect: Mood normal.        Behavior: Behavior normal.      Outpatient Encounter Medications as of 11/14/2022  Medication Sig   amLODipine (NORVASC) 5 MG tablet TAKE 1 TABLET BY MOUTH EVERY DAY   aspirin EC 81 MG tablet Take 81 mg by mouth daily. Swallow whole.   cholecalciferol (VITAMIN D3) 25 MCG (1000 UNIT) tablet Take 1,000 Units by mouth daily.   citalopram (CELEXA) 20 MG tablet TAKE 1 TABLET BY MOUTH EVERY DAY   clopidogrel (PLAVIX) 75 MG tablet TAKE 1 TABLET BY MOUTH EVERY DAY   fluticasone (FLONASE) 50 MCG/ACT nasal spray Place 2 sprays into both nostrils daily. (Patient taking differently: Place 2 sprays into both nostrils as needed.)   losartan-hydrochlorothiazide (HYZAAR) 100-25 MG tablet TAKE 1 TABLET BY MOUTH EVERY DAY   potassium chloride (KLOR-CON M) 10 MEQ tablet Take 10 mEq by mouth once.   potassium chloride (KLOR-CON) 10 MEQ tablet TAKE 1 TABLET BY MOUTH EVERY DAY   rosuvastatin (CRESTOR) 40 MG tablet TAKE 1 TABLET BY MOUTH EVERY DAY   vitamin B-12 (CYANOCOBALAMIN) 1000 MCG tablet Take 1,000 mcg by mouth daily.   No facility-administered encounter medications on file as of 11/14/2022.     Lab Results  Component Value Date   WBC 6.3 11/09/2022   HGB 14.3 11/09/2022   HCT 41.7 11/09/2022   PLT 232.0 11/09/2022   GLUCOSE 89 11/09/2022   CHOL 102 11/09/2022   TRIG 63.0 11/09/2022   HDL 42.40 11/09/2022   LDLDIRECT 129.0 03/26/2018   LDLCALC 47 11/09/2022   ALT  13 11/09/2022   AST 14 11/09/2022   NA 144 11/09/2022   K 4.0 11/09/2022   CL 104 11/09/2022   CREATININE 0.85 11/09/2022   BUN 14 11/09/2022   CO2 33 (H) 11/09/2022   TSH 1.92 11/09/2022   INR 1.0 06/01/2021  HGBA1C 5.7 11/09/2022    DG Bone Density  Result Date: 10/18/2022 EXAM: DUAL X-RAY ABSORPTIOMETRY (DXA) FOR BONE MINERAL DENSITY IMPRESSION: Your patient Rhonna Holster completed a BMD test on 10/18/2022 using the [employer] (software version: 14.10) manufactured by [employer]. The following summarizes the results of our evaluation. Technologist::TNB PATIENT BIOGRAPHICAL: Name: Kemiyah, Tarazon Patient ID: [id number] Birth Date: 26-May-1956 Height: 62.0 in. Gender: Female Exam Date: 10/18/2022 Weight: 134.8 lbs. Indications: Caucasian, Low Calcium Intake, Postmenopausal, Tobacco User, Vitamin D Deficiency Fractures: Treatments: Vitamin D DENSITOMETRY RESULTS: Site         Region     Measured Date Measured Age WHO Classification Young Adult T-score BMD         %Change vs. Previous Significant Change (*) DualFemur Neck Right 10/18/2023 65.4 Osteopenia -2.1 0.743 g/cm2 - - Left Forearm Radius 33% 10/18/2022 65.4 Osteoporosis -2.6 0.645 g/cm2 - - ASSESSMENT: The BMD measured at Forearm Radius 33% is 0.645 g/cm2 with a T-score of -2.6. This patient is considered osteoporotic according to [facility]) criteria. Lumbar spine was not utilized due to advanced degenerative changes. Patient is not a candidate for FRAX due to diagnosis of osteoporosis. The scan quality is good. World Pharmacologist [facility]) criteria for post-menopausal, Caucasian Women: Normal:                   T-score at or above -1 SD Osteopenia/low bone mass: T-score between -1 and -2.5 SD Osteoporosis:             T-score at or below -2.5 SD RECOMMENDATIONS: 1. All patients should optimize calcium and vitamin D intake. 2. Consider FDA-approved medical therapies in postmenopausal women and men aged 70  years and older, based on the following: a. A hip or vertebral(clinical or morphometric) fracture b. T-score < -2.5 at the femoral neck or spine after appropriate evaluation to exclude secondary causes c. Low bone mass (T-score between -1.0 and -2.5 at the femoral neck or spine) and a 10-year probability of a hip fracture > 3% or a 10-year probability of a major osteoporosis-related fracture > 20% based on the [place]-adapted WHO algorithm 3. Clinician judgment and/or patient preferences may indicate treatment for people with 10-year fracture probabilities above or below these levels FOLLOW-UP: People with diagnosed cases of osteoporosis or at high risk for fracture should have regular bone mineral density tests. For patients eligible for Medicare, routine testing is allowed once every 2 years. The testing frequency can be increased to one year for patients who have rapidly progressing disease, those who are receiving or discontinuing medical therapy to restore bone mass, or have additional risk factors. I have reviewed this report, and agree with the above findings. [facility] Electronically Signed   By: Zerita Boers M.D.   On: 10/18/2022 09:25       Assessment & Plan:  Anxiety Assessment & Plan: Overall stable.  Continue citalopram.  Follow    Aortic atherosclerosis (HCC) Assessment & Plan: Continue atorvastatin.     Bilateral carotid artery stenosis Assessment & Plan: 10/2022 - Carotid duplex today reveals a widely patent right carotid artery stent without significant recurrent stenosis and stable 1 to 39% left ICA stenosis. F/u one year.    Essential hypertension, benign Assessment & Plan: On losartan/hctz and amlodipine.  Blood pressure as outlined.  Follow pressures.  Follow metabolic panel.   Orders: -     Basic metabolic panel; Future  Hypercholesterolemia Assessment & Plan:  On crestor 40mg  q day.  Low cholesterol diet and exercise.  Follow lipid panel and liver  function tests.    Orders: -     Lipid panel; Future -     Hepatic function panel; Future  Hyperglycemia Assessment & Plan: Low carb diet and exercise.  Follow met b and a1c.   Orders: -     Hemoglobin A1c; Future  PAD (peripheral artery disease) (HCC) Assessment & Plan: S/p stent (carotid) - followed by vascular surgery.  Continue risk factor modification.    Osteoporosis without current pathological fracture, unspecified osteoporosis type Assessment & Plan: Discussed bone density and osteoporosis.  Discussed treatment options.  She elects calcium, vitamin d and weight bearing exercise.  Wants to hold on prescription medication.  Follow.       Einar Pheasant, MD

## 2022-11-14 NOTE — Patient Instructions (Signed)
Caltrate '600mg'$  plus D - take one tablet twice a day.

## 2022-11-18 ENCOUNTER — Encounter: Payer: Self-pay | Admitting: Internal Medicine

## 2022-11-18 DIAGNOSIS — M810 Age-related osteoporosis without current pathological fracture: Secondary | ICD-10-CM | POA: Insufficient documentation

## 2022-11-18 NOTE — Assessment & Plan Note (Signed)
10/2022 - Carotid duplex today reveals a widely patent right carotid artery stent without significant recurrent stenosis and stable 1 to 39% left ICA stenosis. F/u one year.

## 2022-11-18 NOTE — Assessment & Plan Note (Signed)
S/p stent (carotid) - followed by vascular surgery.  Continue risk factor modification.

## 2022-11-18 NOTE — Assessment & Plan Note (Signed)
Discussed bone density and osteoporosis.  Discussed treatment options.  She elects calcium, vitamin d and weight bearing exercise.  Wants to hold on prescription medication.  Follow.

## 2022-11-18 NOTE — Assessment & Plan Note (Signed)
Continue atorvastatin

## 2022-11-18 NOTE — Assessment & Plan Note (Signed)
Low carb diet and exercise.  Follow met b and a1c.   

## 2022-11-18 NOTE — Assessment & Plan Note (Signed)
Overall stable.  Continue citalopram.  Follow

## 2022-11-18 NOTE — Assessment & Plan Note (Signed)
On losartan/hctz and amlodipine.  Blood pressure as outlined.  Follow pressures.  Follow metabolic panel.

## 2022-11-18 NOTE — Assessment & Plan Note (Signed)
On crestor 40mg q day.  Low cholesterol diet and exercise.  Follow lipid panel and liver function tests.   ?

## 2023-01-02 DIAGNOSIS — K080 Exfoliation of teeth due to systemic causes: Secondary | ICD-10-CM | POA: Diagnosis not present

## 2023-01-21 DIAGNOSIS — K080 Exfoliation of teeth due to systemic causes: Secondary | ICD-10-CM | POA: Diagnosis not present

## 2023-02-28 DIAGNOSIS — Z01419 Encounter for gynecological examination (general) (routine) without abnormal findings: Secondary | ICD-10-CM | POA: Diagnosis not present

## 2023-02-28 DIAGNOSIS — Z7689 Persons encountering health services in other specified circumstances: Secondary | ICD-10-CM | POA: Diagnosis not present

## 2023-02-28 DIAGNOSIS — Z1231 Encounter for screening mammogram for malignant neoplasm of breast: Secondary | ICD-10-CM | POA: Diagnosis not present

## 2023-02-28 DIAGNOSIS — Z124 Encounter for screening for malignant neoplasm of cervix: Secondary | ICD-10-CM | POA: Diagnosis not present

## 2023-02-28 LAB — HM MAMMOGRAPHY

## 2023-03-01 ENCOUNTER — Other Ambulatory Visit (INDEPENDENT_AMBULATORY_CARE_PROVIDER_SITE_OTHER): Payer: Self-pay | Admitting: Nurse Practitioner

## 2023-03-11 LAB — HM PAP SMEAR

## 2023-03-13 ENCOUNTER — Other Ambulatory Visit (INDEPENDENT_AMBULATORY_CARE_PROVIDER_SITE_OTHER): Payer: Medicare Other

## 2023-03-13 DIAGNOSIS — E7800 Pure hypercholesterolemia, unspecified: Secondary | ICD-10-CM

## 2023-03-13 DIAGNOSIS — R739 Hyperglycemia, unspecified: Secondary | ICD-10-CM | POA: Diagnosis not present

## 2023-03-13 DIAGNOSIS — I10 Essential (primary) hypertension: Secondary | ICD-10-CM | POA: Diagnosis not present

## 2023-03-13 LAB — HEPATIC FUNCTION PANEL
ALT: 10 U/L (ref 0–35)
AST: 15 U/L (ref 0–37)
Albumin: 4.1 g/dL (ref 3.5–5.2)
Alkaline Phosphatase: 77 U/L (ref 39–117)
Bilirubin, Direct: 0.1 mg/dL (ref 0.0–0.3)
Total Bilirubin: 0.6 mg/dL (ref 0.2–1.2)
Total Protein: 7.1 g/dL (ref 6.0–8.3)

## 2023-03-13 LAB — LIPID PANEL
Cholesterol: 109 mg/dL (ref 0–200)
HDL: 39.5 mg/dL (ref >39.00)
LDL Cholesterol: 52 mg/dL (ref 0–99)
NonHDL: 69.02
Total CHOL/HDL Ratio: 3
Triglycerides: 83 mg/dL (ref 0.0–149.0)
VLDL: 16.6 mg/dL (ref 0.0–40.0)

## 2023-03-13 LAB — BASIC METABOLIC PANEL
BUN: 12 mg/dL (ref 6–23)
CO2: 31 mEq/L (ref 19–32)
Calcium: 9.2 mg/dL (ref 8.4–10.5)
Chloride: 101 mEq/L (ref 96–112)
Creatinine, Ser: 0.84 mg/dL (ref 0.40–1.20)
GFR: 72.73 mL/min (ref >60.00)
Glucose, Bld: 91 mg/dL (ref 70–99)
Potassium: 3.3 mEq/L — ABNORMAL LOW (ref 3.5–5.1)
Sodium: 140 mEq/L (ref 135–145)

## 2023-03-13 LAB — HEMOGLOBIN A1C: Hgb A1c MFr Bld: 5.7 % (ref 4.6–6.5)

## 2023-03-18 ENCOUNTER — Encounter: Payer: Self-pay | Admitting: Internal Medicine

## 2023-03-18 ENCOUNTER — Ambulatory Visit (INDEPENDENT_AMBULATORY_CARE_PROVIDER_SITE_OTHER): Payer: Medicare Other | Admitting: Internal Medicine

## 2023-03-18 VITALS — BP 108/70 | HR 76 | Temp 97.9°F | Resp 16 | Ht 62.0 in | Wt 133.0 lb

## 2023-03-18 DIAGNOSIS — R739 Hyperglycemia, unspecified: Secondary | ICD-10-CM

## 2023-03-18 DIAGNOSIS — I700 Atherosclerosis of aorta: Secondary | ICD-10-CM | POA: Diagnosis not present

## 2023-03-18 DIAGNOSIS — F419 Anxiety disorder, unspecified: Secondary | ICD-10-CM

## 2023-03-18 DIAGNOSIS — I739 Peripheral vascular disease, unspecified: Secondary | ICD-10-CM

## 2023-03-18 DIAGNOSIS — I6523 Occlusion and stenosis of bilateral carotid arteries: Secondary | ICD-10-CM

## 2023-03-18 DIAGNOSIS — E876 Hypokalemia: Secondary | ICD-10-CM | POA: Diagnosis not present

## 2023-03-18 DIAGNOSIS — I10 Essential (primary) hypertension: Secondary | ICD-10-CM

## 2023-03-18 DIAGNOSIS — R0781 Pleurodynia: Secondary | ICD-10-CM

## 2023-03-18 DIAGNOSIS — K5900 Constipation, unspecified: Secondary | ICD-10-CM

## 2023-03-18 DIAGNOSIS — E7800 Pure hypercholesterolemia, unspecified: Secondary | ICD-10-CM

## 2023-03-18 DIAGNOSIS — R0789 Other chest pain: Secondary | ICD-10-CM | POA: Insufficient documentation

## 2023-03-18 LAB — POTASSIUM: Potassium: 3.7 mEq/L (ref 3.5–5.1)

## 2023-03-18 MED ORDER — CITALOPRAM HYDROBROMIDE 20 MG PO TABS: 20.0000 mg | ORAL_TABLET | Freq: Every day | ORAL | 1 refills | Status: DC

## 2023-03-18 MED ORDER — LOSARTAN POTASSIUM-HCTZ 100-25 MG PO TABS: 1.0000 | ORAL_TABLET | Freq: Every day | ORAL | 3 refills | Status: DC

## 2023-03-18 MED ORDER — AMLODIPINE BESYLATE 5 MG PO TABS: 5.0000 mg | ORAL_TABLET | Freq: Every day | ORAL | 3 refills | Status: DC

## 2023-03-18 MED ORDER — ROSUVASTATIN CALCIUM 40 MG PO TABS: 40.0000 mg | ORAL_TABLET | Freq: Every day | ORAL | 3 refills | Status: DC

## 2023-03-18 NOTE — Patient Instructions (Signed)
Take one TUMS twice a day  Melatonin - 3mg  - take one before bed

## 2023-03-18 NOTE — Progress Notes (Addendum)
Subjective:    Patient ID: Kim Love, female    DOB: 07/10/1956, 67 y.o.   MRN: [id number]  Patient here for  Chief Complaint  Patient presents with   Medical Management of Chronic Issues    HPI Here to follow up regarding increased stress, hypertension and hypercholesterolemia.  On celexa. Follow up with Dr Wyn Quaker 11/02/22 - Carotid duplex revealed a widely patent right carotid artery stent without significant recurrent stenosis and stable 1 to 39% left ICA stenosis. Recommended annual f/u. Stays active.  No chest pain or sob reported.  No cough or congestion.  No abdominal pain reported.  Some constipation.  Eating activia and taking dulcolax.  Discussed benefiber.  Discussed f/u with GI.  She will call.     Past Medical History:  Diagnosis Date   Anxiety    Hypercholesterolemia    Hypertension    Tobacco abuse    Past Surgical History:  Procedure Laterality Date   CAROTID PTA/STENT INTERVENTION Right 06/12/2021   Procedure: CAROTID PTA/STENT INTERVENTION;  Surgeon: Annice Needy, MD;  Location: [facility];  Service: Cardiovascular;  Laterality: Right;   TONSILLECTOMY AND ADENOIDECTOMY Bilateral 1980   Family History  Problem Relation Age of Onset   Stroke Maternal Grandmother    Stroke Maternal Grandfather    Social History   Socioeconomic History   Marital status: Married    Spouse name: Merchant navy officer   Number of children: 1   Years of education: Not on file   Highest education level: Not on file  Occupational History    Employer: [employer]  Tobacco Use   Smoking status: Every Day    Current packs/day: 0.00    Average packs/day: 1 pack/day for 30.0 years (30.0 ttl pk-yrs)    Types: Cigarettes    Start date: 09/10/1982    Last attempt to quit: 09/10/2012    Years since quitting: 10.5   Smokeless tobacco: Never  Vaping Use   Vaping status: Never Used  Substance and Sexual Activity   Alcohol use: No    Comment: "once in a blue moon: I can't remember the last  time."   Drug use: No   Sexual activity: Not on file  Other Topics Concern   Not on file  Social History Narrative   Lives at home with spouse       Right handed   Social Determinants of Health   Financial Resource Strain: Not on file  Food Insecurity: Not on file  Transportation Needs: Not on file  Physical Activity: Not on file  Stress: Not on file  Social Connections: Not on file     Review of Systems  Constitutional:  Negative for appetite change and unexpected weight change.  HENT:  Negative for congestion and sinus pressure.   Respiratory:  Negative for cough, chest tightness and shortness of breath.   Cardiovascular:  Negative for chest pain and palpitations.  Gastrointestinal:  Positive for constipation. Negative for abdominal pain, nausea and vomiting.  Genitourinary:  Negative for difficulty urinating and dysuria.  Musculoskeletal:  Negative for joint swelling and myalgias.  Skin:  Negative for color change and rash.  Neurological:  Negative for dizziness and headaches.  Psychiatric/Behavioral:  Negative for agitation and dysphoric mood.        Objective:     BP 108/70   Pulse 76   Temp 97.9 F (36.6 C)   Resp 16   Ht 5\' 2"  (1.575 m)   Wt 133  lb (60.3 kg)   SpO2 97%   BMI 24.33 kg/m  Wt Readings from Last 3 Encounters:  03/18/23 133 lb (60.3 kg)  11/14/22 134 lb 12.8 oz (61.1 kg)  11/02/22 136 lb 6.4 oz (61.9 kg)    Physical Exam Vitals reviewed.  Constitutional:      General: She is not in acute distress.    Appearance: Normal appearance.  HENT:     Head: Normocephalic and atraumatic.     Right Ear: External ear normal.     Left Ear: External ear normal.  Eyes:     General: No scleral icterus.       Right eye: No discharge.        Left eye: No discharge.     Conjunctiva/sclera: Conjunctivae normal.  Neck:     Thyroid: No thyromegaly.  Cardiovascular:     Rate and Rhythm: Normal rate and regular rhythm.  Pulmonary:     Effort: No  respiratory distress.     Breath sounds: Normal breath sounds. No wheezing.  Abdominal:     General: Bowel sounds are normal.     Palpations: Abdomen is soft.     Tenderness: There is no abdominal tenderness.  Musculoskeletal:        General: No swelling or tenderness.     Cervical back: Neck supple. No tenderness.  Lymphadenopathy:     Cervical: No cervical adenopathy.  Skin:    Findings: No erythema or rash.  Neurological:     Mental Status: She is alert.  Psychiatric:        Mood and Affect: Mood normal.        Behavior: Behavior normal.      Outpatient Encounter Medications as of 03/18/2023  Medication Sig   amLODipine (NORVASC) 5 MG tablet Take 1 tablet (5 mg total) by mouth daily.   aspirin EC 81 MG tablet Take 81 mg by mouth daily. Swallow whole.   cholecalciferol (VITAMIN D3) 25 MCG (1000 UNIT) tablet Take 1,000 Units by mouth daily.   citalopram (CELEXA) 20 MG tablet Take 1 tablet (20 mg total) by mouth daily.   clopidogrel (PLAVIX) 75 MG tablet TAKE 1 TABLET BY MOUTH EVERY DAY   fluticasone (FLONASE) 50 MCG/ACT nasal spray Place 2 sprays into both nostrils daily. (Patient taking differently: Place 2 sprays into both nostrils as needed.)   losartan-hydrochlorothiazide (HYZAAR) 100-25 MG tablet Take 1 tablet by mouth daily.   potassium chloride (KLOR-CON M) 10 MEQ tablet Take 10 mEq by mouth once.   potassium chloride (KLOR-CON) 10 MEQ tablet TAKE 1 TABLET BY MOUTH EVERY DAY   rosuvastatin (CRESTOR) 40 MG tablet Take 1 tablet (40 mg total) by mouth daily.   vitamin B-12 (CYANOCOBALAMIN) 1000 MCG tablet Take 1,000 mcg by mouth daily.   [DISCONTINUED] amLODipine (NORVASC) 5 MG tablet TAKE 1 TABLET BY MOUTH EVERY DAY   [DISCONTINUED] citalopram (CELEXA) 20 MG tablet TAKE 1 TABLET BY MOUTH EVERY DAY   [DISCONTINUED] losartan-hydrochlorothiazide (HYZAAR) 100-25 MG tablet TAKE 1 TABLET BY MOUTH EVERY DAY   [DISCONTINUED] rosuvastatin (CRESTOR) 40 MG tablet TAKE 1 TABLET BY MOUTH  EVERY DAY   No facility-administered encounter medications on file as of 03/18/2023.     Lab Results  Component Value Date   WBC 6.3 11/09/2022   HGB 14.3 11/09/2022   HCT 41.7 11/09/2022   PLT 232.0 11/09/2022   GLUCOSE 91 03/13/2023   CHOL 109 03/13/2023   TRIG 83.0 03/13/2023   HDL 39.50 03/13/2023  LDLDIRECT 129.0 03/26/2018   LDLCALC 52 03/13/2023   ALT 10 03/13/2023   AST 15 03/13/2023   NA 140 03/13/2023   K 3.7 03/18/2023   CL 101 03/13/2023   CREATININE 0.84 03/13/2023   BUN 12 03/13/2023   CO2 31 03/13/2023   TSH 1.92 11/09/2022   INR 1.0 06/01/2021   HGBA1C 5.7 03/13/2023    DG Bone Density  Result Date: 10/18/2022 EXAM: DUAL X-RAY ABSORPTIOMETRY (DXA) FOR BONE MINERAL DENSITY IMPRESSION: Your patient Elliona Doddridge completed a BMD test on 10/18/2022 using the [employer] iDXA DXA System (software version: 14.10) manufactured by [employer]. The following summarizes the results of our evaluation. Technologist::TNB PATIENT BIOGRAPHICAL: Name: Kahlen, Boyde Patient ID: [id number] Birth Date: Oct 26, 1955 Height: 62.0 in. Gender: Female Exam Date: 10/18/2022 Weight: 134.8 lbs. Indications: Caucasian, Low Calcium Intake, Postmenopausal, Tobacco User, Vitamin D Deficiency Fractures: Treatments: Vitamin D DENSITOMETRY RESULTS: Site         Region     Measured Date Measured Age WHO Classification Young Adult T-score BMD         %Change vs. Previous Significant Change (*) DualFemur Neck Right 10/18/2022 65.4 Osteopenia -2.1 0.743 g/cm2 - - Left Forearm Radius 33% 10/18/2022 65.4 Osteoporosis -2.6 0.645 g/cm2 - - ASSESSMENT: The BMD measured at Forearm Radius 33% is 0.645 g/cm2 with a T-score of -2.6. This patient is considered osteoporotic according to World Health Organization [facility]) criteria. Lumbar spine was not utilized due to advanced degenerative changes. Patient is not a candidate for FRAX due to diagnosis of osteoporosis. The scan quality is good. World Science writer [facility])  criteria for post-menopausal, Caucasian Women: Normal:                   T-score at or above -1 SD Osteopenia/low bone mass: T-score between -1 and -2.5 SD Osteoporosis:             T-score at or below -2.5 SD RECOMMENDATIONS: 1. All patients should optimize calcium and vitamin D intake. 2. Consider FDA-approved medical therapies in postmenopausal women and men aged 2 years and older, based on the following: a. A hip or vertebral(clinical or morphometric) fracture b. T-score < -2.5 at the femoral neck or spine after appropriate evaluation to exclude secondary causes c. Low bone mass (T-score between -1.0 and -2.5 at the femoral neck or spine) and a 10-year probability of a hip fracture > 3% or a 10-year probability of a major osteoporosis-related fracture > 20% based on the [place]-adapted WHO algorithm 3. Clinician judgment and/or patient preferences may indicate treatment for people with 10-year fracture probabilities above or below these levels FOLLOW-UP: People with diagnosed cases of osteoporosis or at high risk for fracture should have regular bone mineral density tests. For patients eligible for Medicare, routine testing is allowed once every 2 years. The testing frequency can be increased to one year for patients who have rapidly progressing disease, those who are receiving or discontinuing medical therapy to restore bone mass, or have additional risk factors. I have reviewed this report, and agree with the above findings. [facility] Electronically Signed   By: Romona Curls M.D.   On: 10/18/2022 09:25       Assessment & Plan:  Hypokalemia Assessment & Plan: Recheck potassium today.   Orders: -     Potassium  Rib pain on left side -     DG Ribs Unilateral Left; Future  Anxiety Assessment & Plan: Overall stable.  Continue citalopram.  Follow    Aortic atherosclerosis (HCC) Assessment & Plan: Continue atorvastatin.     Bilateral carotid artery stenosis Assessment &  Plan: 10/2022 - Carotid duplex today reveals a widely patent right carotid artery stent without significant recurrent stenosis and stable 1 to 39% left ICA stenosis. F/u one year.    Essential hypertension, benign Assessment & Plan: On losartan/hctz and amlodipine.  Blood pressure as outlined.  Follow pressures.  Follow metabolic panel.    Hypercholesterolemia Assessment & Plan: On crestor 40mg  q day.  Low cholesterol diet and exercise.  Follow lipid panel and liver function tests.   Lab Results  Component Value Date   CHOL 109 03/13/2023   HDL 39.50 03/13/2023   LDLCALC 52 03/13/2023   LDLDIRECT 129.0 03/26/2018   TRIG 83.0 03/13/2023   CHOLHDL 3 03/13/2023      Hyperglycemia Assessment & Plan: Low carb diet and exercise.  Follow met b and a1c.    PAD (peripheral artery disease) (HCC) Assessment & Plan: S/p stent (carotid) - followed by vascular surgery.  Continue risk factor modification.    Constipation, unspecified constipation type Assessment & Plan: Discussed benefiber as outlined.    Other orders -     amLODIPine Besylate; Take 1 tablet (5 mg total) by mouth daily.  Dispense: 90 tablet; Refill: 3 -     Citalopram Hydrobromide; Take 1 tablet (20 mg total) by mouth daily.  Dispense: 90 tablet; Refill: 1 -     Losartan Potassium-HCTZ; Take 1 tablet by mouth daily.  Dispense: 90 tablet; Refill: 3 -     Rosuvastatin Calcium; Take 1 tablet (40 mg total) by mouth daily.  Dispense: 90 tablet; Refill: 3     Dale Mariano Colon, MD

## 2023-03-20 ENCOUNTER — Ambulatory Visit (INDEPENDENT_AMBULATORY_CARE_PROVIDER_SITE_OTHER): Payer: Medicare Other

## 2023-03-20 ENCOUNTER — Other Ambulatory Visit: Payer: Medicare Other

## 2023-03-20 DIAGNOSIS — R0781 Pleurodynia: Secondary | ICD-10-CM

## 2023-03-21 DIAGNOSIS — R0781 Pleurodynia: Secondary | ICD-10-CM | POA: Diagnosis not present

## 2023-03-24 ENCOUNTER — Encounter: Payer: Self-pay | Admitting: Internal Medicine

## 2023-03-24 DIAGNOSIS — K5900 Constipation, unspecified: Secondary | ICD-10-CM | POA: Insufficient documentation

## 2023-03-24 NOTE — Assessment & Plan Note (Signed)
10/2022 - Carotid duplex today reveals a widely patent right carotid artery stent without significant recurrent stenosis and stable 1 to 39% left ICA stenosis. F/u one year.  

## 2023-03-24 NOTE — Assessment & Plan Note (Signed)
Continue atorvastatin

## 2023-03-24 NOTE — Assessment & Plan Note (Signed)
Recheck potassium today. 

## 2023-03-24 NOTE — Assessment & Plan Note (Signed)
Overall stable.  Continue citalopram.  Follow  

## 2023-03-24 NOTE — Assessment & Plan Note (Signed)
S/p stent (carotid) - followed by vascular surgery.  Continue risk factor modification.  

## 2023-03-24 NOTE — Assessment & Plan Note (Signed)
Discussed benefiber as outlined.

## 2023-03-24 NOTE — Assessment & Plan Note (Signed)
On losartan/hctz and amlodipine.  Blood pressure as outlined.  Follow pressures.  Follow metabolic panel.  

## 2023-03-24 NOTE — Assessment & Plan Note (Addendum)
On crestor 40mg  q day.  Low cholesterol diet and exercise.  Follow lipid panel and liver function tests.   Lab Results  Component Value Date   CHOL 109 03/13/2023   HDL 39.50 03/13/2023   LDLCALC 52 03/13/2023   LDLDIRECT 129.0 03/26/2018   TRIG 83.0 03/13/2023   CHOLHDL 3 03/13/2023

## 2023-03-24 NOTE — Assessment & Plan Note (Signed)
Low carb diet and exercise.  Follow met b and a1c.   

## 2023-03-28 ENCOUNTER — Telehealth: Payer: Self-pay

## 2023-03-28 NOTE — Telephone Encounter (Signed)
-----   Message from [place] sent at 03/28/2023  5:42 AM EDT ----- Notify - rib xray is ok.  No fracture or other bony lesions. Lungs are clear.  If persistent pain or problems can schedule f/u appt to reevaluate.

## 2023-06-03 ENCOUNTER — Other Ambulatory Visit (INDEPENDENT_AMBULATORY_CARE_PROVIDER_SITE_OTHER): Payer: Self-pay | Admitting: Nurse Practitioner

## 2023-06-18 ENCOUNTER — Telehealth: Payer: Self-pay | Admitting: Internal Medicine

## 2023-06-18 NOTE — Telephone Encounter (Signed)
Pt called stating she would like the provider to order her an MRI because of her back and thigh pain

## 2023-06-19 NOTE — Telephone Encounter (Signed)
Called patient. Unable to leave message. Needs appt. Insurance will not pay for MRI without proper work up.

## 2023-06-20 ENCOUNTER — Ambulatory Visit: Payer: Medicare Other | Admitting: Nurse Practitioner

## 2023-06-20 ENCOUNTER — Encounter: Payer: Self-pay | Admitting: Nurse Practitioner

## 2023-06-20 VITALS — BP 148/78 | HR 76 | Temp 98.6°F | Ht 62.0 in | Wt 132.4 lb

## 2023-06-20 DIAGNOSIS — S29012A Strain of muscle and tendon of back wall of thorax, initial encounter: Secondary | ICD-10-CM

## 2023-06-20 MED ORDER — BACLOFEN 10 MG PO TABS
10.0000 mg | ORAL_TABLET | Freq: Three times a day (TID) | ORAL | 0 refills | Status: DC
Start: 2023-06-20 — End: 2023-07-19

## 2023-06-20 NOTE — Telephone Encounter (Signed)
Pt scheduled with Arville Lime

## 2023-06-20 NOTE — Progress Notes (Signed)
Bethanie Dicker, NP-C Phone: [phone]  Kim Love is a 67 y.o. female who presents today for left sided back pain.   Patient with left sided thoracic and lower rib pain for 2 months. She reports falling in July and has had pain since. She had a negative rib xray at that time. The pain is intermittent. It is worse with sitting and when at rest. She describes the pain as aching. She has been taking Tylenol and using a heating pad with minimal relief in her pain. She feels that the area is very tight and like it spasms.   Social History   Tobacco Use  Smoking Status Every Day   Current packs/day: 0.00   Average packs/day: 1 pack/day for 30.0 years (30.0 ttl pk-yrs)   Types: Cigarettes   Start date: 09/10/1982   Last attempt to quit: 09/10/2012   Years since quitting: 10.8  Smokeless Tobacco Never    Current Outpatient Medications on File Prior to Visit  Medication Sig Dispense Refill   amLODipine (NORVASC) 5 MG tablet Take 1 tablet (5 mg total) by mouth daily. 90 tablet 3   aspirin EC 81 MG tablet Take 81 mg by mouth daily. Swallow whole.     cholecalciferol (VITAMIN D3) 25 MCG (1000 UNIT) tablet Take 1,000 Units by mouth daily.     clopidogrel (PLAVIX) 75 MG tablet TAKE 1 TABLET BY MOUTH EVERY DAY 90 tablet 1   fluticasone (FLONASE) 50 MCG/ACT nasal spray Place 2 sprays into both nostrils daily. (Patient taking differently: Place 2 sprays into both nostrils as needed.) 16 g 6   losartan-hydrochlorothiazide (HYZAAR) 100-25 MG tablet Take 1 tablet by mouth daily. 90 tablet 3   potassium chloride (KLOR-CON) 10 MEQ tablet TAKE 1 TABLET BY MOUTH EVERY DAY 90 tablet 3   rosuvastatin (CRESTOR) 40 MG tablet Take 1 tablet (40 mg total) by mouth daily. 90 tablet 3   vitamin B-12 (CYANOCOBALAMIN) 1000 MCG tablet Take 1,000 mcg by mouth daily.     No current facility-administered medications on file prior to visit.     ROS see history of present illness  Objective  Physical Exam Vitals:    06/20/23 1438  BP: (!) 148/78  Pulse: 76  Temp: 98.6 F (37 C)  SpO2: 96%    BP Readings from Last 3 Encounters:  07/01/23 118/68  06/20/23 (!) 148/78  03/18/23 108/70   Wt Readings from Last 3 Encounters:  07/01/23 134 lb 3.2 oz (60.9 kg)  06/20/23 132 lb 6.4 oz (60.1 kg)  03/18/23 133 lb (60.3 kg)    Physical Exam Constitutional:      General: She is not in acute distress.    Appearance: Normal appearance.  HENT:     Head: Normocephalic.  Cardiovascular:     Rate and Rhythm: Normal rate and regular rhythm.     Heart sounds: Normal heart sounds.  Pulmonary:     Effort: Pulmonary effort is normal.     Breath sounds: Normal breath sounds.  Musculoskeletal:     Thoracic back: Spasms (muscle tightness noted along left lat) and tenderness (left lower ribs, lat tender to palpation) present.       Back:  Skin:    General: Skin is warm and dry.  Neurological:     General: No focal deficit present.     Mental Status: She is alert.  Psychiatric:        Mood and Affect: Mood normal.        Behavior:  Behavior normal.    Assessment/Plan: Please see individual problem list.  Strain of left latissimus dorsi muscle Assessment & Plan: Symptoms and exam consistent with muscle strain/spasm. Likely from previous fall. Left lat tight and tender on palpation. Will treat with Baclofen 10 mg TID PRN. She can continue Tylenol as needed for pain. She can alternate ice and heat. Advised OTC pain relief creams and patches as needed. Return precautions given to patient.   Orders: -     Baclofen; Take 1 tablet (10 mg total) by mouth 3 (three) times daily.  Dispense: 30 each; Refill: 0   Return if symptoms worsen or fail to improve.   Bethanie Dicker, NP-C [facility] - [employer]

## 2023-06-26 ENCOUNTER — Other Ambulatory Visit: Payer: Self-pay | Admitting: Internal Medicine

## 2023-07-01 ENCOUNTER — Ambulatory Visit: Payer: Medicare Other | Admitting: Internal Medicine

## 2023-07-01 ENCOUNTER — Encounter: Payer: Self-pay | Admitting: Internal Medicine

## 2023-07-01 ENCOUNTER — Encounter: Payer: Self-pay | Admitting: Obstetrics and Gynecology

## 2023-07-01 VITALS — BP 118/68 | HR 76 | Temp 98.0°F | Resp 16 | Ht 62.0 in | Wt 134.2 lb

## 2023-07-01 DIAGNOSIS — E7800 Pure hypercholesterolemia, unspecified: Secondary | ICD-10-CM | POA: Diagnosis not present

## 2023-07-01 DIAGNOSIS — I6523 Occlusion and stenosis of bilateral carotid arteries: Secondary | ICD-10-CM

## 2023-07-01 DIAGNOSIS — M549 Dorsalgia, unspecified: Secondary | ICD-10-CM | POA: Diagnosis not present

## 2023-07-01 DIAGNOSIS — I10 Essential (primary) hypertension: Secondary | ICD-10-CM | POA: Diagnosis not present

## 2023-07-01 DIAGNOSIS — E785 Hyperlipidemia, unspecified: Secondary | ICD-10-CM | POA: Diagnosis not present

## 2023-07-01 DIAGNOSIS — D649 Anemia, unspecified: Secondary | ICD-10-CM

## 2023-07-01 DIAGNOSIS — R739 Hyperglycemia, unspecified: Secondary | ICD-10-CM

## 2023-07-01 DIAGNOSIS — F419 Anxiety disorder, unspecified: Secondary | ICD-10-CM

## 2023-07-01 DIAGNOSIS — I700 Atherosclerosis of aorta: Secondary | ICD-10-CM

## 2023-07-01 DIAGNOSIS — I739 Peripheral vascular disease, unspecified: Secondary | ICD-10-CM

## 2023-07-01 LAB — LIPID PANEL
Cholesterol: 108 mg/dL (ref 0–200)
HDL: 41 mg/dL (ref >39.00)
LDL Cholesterol: 51 mg/dL (ref 0–99)
NonHDL: 66.5
Total CHOL/HDL Ratio: 3
Triglycerides: 78 mg/dL (ref 0.0–149.0)
VLDL: 15.6 mg/dL (ref 0.0–40.0)

## 2023-07-01 LAB — HEPATIC FUNCTION PANEL
ALT: 12 U/L (ref 0–35)
AST: 16 U/L (ref 0–37)
Albumin: 4.1 g/dL (ref 3.5–5.2)
Alkaline Phosphatase: 74 U/L (ref 39–117)
Bilirubin, Direct: 0.1 mg/dL (ref 0.0–0.3)
Total Bilirubin: 0.6 mg/dL (ref 0.2–1.2)
Total Protein: 6.8 g/dL (ref 6.0–8.3)

## 2023-07-01 LAB — BASIC METABOLIC PANEL
BUN: 13 mg/dL (ref 6–23)
CO2: 32 meq/L (ref 19–32)
Calcium: 8.9 mg/dL (ref 8.4–10.5)
Chloride: 102 meq/L (ref 96–112)
Creatinine, Ser: 0.8 mg/dL (ref 0.40–1.20)
GFR: 76.95 mL/min (ref >60.00)
Glucose, Bld: 92 mg/dL (ref 70–99)
Potassium: 3.7 meq/L (ref 3.5–5.1)
Sodium: 141 meq/L (ref 135–145)

## 2023-07-01 LAB — HEMOGLOBIN A1C: Hgb A1c MFr Bld: 5.6 % (ref 4.6–6.5)

## 2023-07-01 MED ORDER — CITALOPRAM HYDROBROMIDE 20 MG PO TABS: 20.0000 mg | ORAL_TABLET | Freq: Every day | ORAL | 1 refills | Status: DC

## 2023-07-01 NOTE — Assessment & Plan Note (Signed)
Persistent increased pain.  Worse with standing or driving.  Pain with palpation.  Previous rib xray - negative.  Taking baclofen - helping.  Controls pain if takes regularly, but is concerned regarding persistent pain when off baclofen.  Discussed further w/up and evaluation given persistent pain.  Refer to ortho for further evaluation.

## 2023-07-01 NOTE — Assessment & Plan Note (Signed)
Overall stable.  Continue citalopram.  Follow

## 2023-07-01 NOTE — Assessment & Plan Note (Signed)
10/2022 - Carotid duplex today reveals a widely patent right carotid artery stent without significant recurrent stenosis and stable 1 to 39% left ICA stenosis. F/u one year.

## 2023-07-01 NOTE — Assessment & Plan Note (Signed)
S/p stent (carotid) - followed by vascular surgery.  Continue risk factor modification.  

## 2023-07-01 NOTE — Assessment & Plan Note (Signed)
11/09/22 - hgb wnl.

## 2023-07-01 NOTE — Assessment & Plan Note (Signed)
On losartan/hctz and amlodipine.  Blood pressure as outlined.  Follow pressures.  Follow metabolic panel.  

## 2023-07-01 NOTE — Assessment & Plan Note (Signed)
Continue atorvastatin

## 2023-07-01 NOTE — Assessment & Plan Note (Signed)
On crestor 40mg  q day.  Low cholesterol diet and exercise.  Follow lipid panel and liver function tests.   Lab Results  Component Value Date   CHOL 108 07/01/2023   HDL 41.00 07/01/2023   LDLCALC 51 07/01/2023   LDLDIRECT 129.0 03/26/2018   TRIG 78.0 07/01/2023   CHOLHDL 3 07/01/2023

## 2023-07-01 NOTE — Progress Notes (Signed)
Subjective:    Patient ID: Kim Love, female    DOB: 08-23-1956, 67 y.o.   MRN: [id number]  Patient here for  Chief Complaint  Patient presents with   Flank Pain    HPI Work in appt.  Work in for back pain. Was evaluated 06/20/23 - Bethanie Dicker.  Prescribed baclofen. Presents today with persistent left flank pain pain.  Pain - localized lower posterior left ribs and will radiate around side.  No rash.  Larey Seat in July.  Her mid back - hit a wooden chair. Persistent intermittent pain since then.  Taking the baclofen bid.  Controls the pain when taking, but returns when stops.  No pain down the leg.  Does occasionally have pain down into groin. No chest pain or sob reported.  No abdominal pain. No urine or bowel change.  Reports pain is worse when standing or driving.    Past Medical History:  Diagnosis Date   Anxiety    Hypercholesterolemia    Hypertension    Tobacco abuse    Past Surgical History:  Procedure Laterality Date   CAROTID PTA/STENT INTERVENTION Right 06/12/2021   Procedure: CAROTID PTA/STENT INTERVENTION;  Surgeon: Annice Needy, MD;  Location: [facility] IN[facility]IVE [facility];  Service: Cardiovascular;  Laterality: Right;   TONSILLECTOMY AND ADENOIDECTOMY Bilateral 1980   Family History  Problem Relation Age of Onset   Stroke Maternal Grandmother    Stroke Maternal Grandfather    Social History   Socioeconomic History   Marital status: Married    Spouse name: Merchant navy officer   Number of children: 1   Years of education: Not on file   Highest education level: Not on file  Occupational History    Employer: [employer]  Tobacco Use   Smoking status: Every Day    Current packs/day: 0.00    Average packs/day: 1 pack/day for 30.0 years (30.0 ttl pk-yrs)    Types: Cigarettes    Start date: 09/10/1982    Last attempt to quit: 09/10/2012    Years since quitting: 10.8   Smokeless tobacco: Never  Vaping Use   Vaping status: Never Used  Substance and Sexual Activity   Alcohol use: No     Comment: "once in a blue moon: I can't remember the last time."   Drug use: No   Sexual activity: Not on file  Other Topics Concern   Not on file  Social History Narrative   Lives at home with spouse       Right handed   Social Determinants of Health   Financial Resource Strain: Not on file  Food Insecurity: Not on file  Transportation Needs: Not on file  Physical Activity: Not on file  Stress: Not on file  Social Connections: Not on file     Review of Systems  Constitutional:  Negative for appetite change and unexpected weight change.  HENT:  Negative for congestion and sinus pressure.   Respiratory:  Negative for cough, chest tightness and shortness of breath.   Cardiovascular:  Negative for chest pain, palpitations and leg swelling.  Gastrointestinal:  Negative for abdominal pain, diarrhea, nausea and vomiting.  Genitourinary:  Negative for difficulty urinating and dysuria.  Musculoskeletal:  Positive for back pain. Negative for joint swelling and myalgias.  Skin:  Negative for color change and rash.  Neurological:  Negative for dizziness and headaches.  Psychiatric/Behavioral:  Negative for agitation and dysphoric mood.        Objective:  BP 118/68   Pulse 76   Temp 98 F (36.7 C)   Resp 16   Ht 5\' 2"  (1.575 m)   Wt 134 lb 3.2 oz (60.9 kg)   SpO2 98%   BMI 24.55 kg/m  Wt Readings from Last 3 Encounters:  07/01/23 134 lb 3.2 oz (60.9 kg)  06/20/23 132 lb 6.4 oz (60.1 kg)  03/18/23 133 lb (60.3 kg)    Physical Exam Vitals reviewed.  Constitutional:      General: She is not in acute distress.    Appearance: Normal appearance.  HENT:     Head: Normocephalic and atraumatic.     Right Ear: External ear normal.     Left Ear: External ear normal.  Eyes:     General: No scleral icterus.       Right eye: No discharge.        Left eye: No discharge.     Conjunctiva/sclera: Conjunctivae normal.  Neck:     Thyroid: No thyromegaly.  Cardiovascular:      Rate and Rhythm: Normal rate and regular rhythm.  Pulmonary:     Effort: No respiratory distress.     Breath sounds: Normal breath sounds. No wheezing.  Abdominal:     General: Bowel sounds are normal.     Palpations: Abdomen is soft.     Tenderness: There is no abdominal tenderness.  Musculoskeletal:        General: No swelling.     Cervical back: Neck supple. No tenderness.     Comments: Tenderness to palpation - lower posterior left ribs.   Lymphadenopathy:     Cervical: No cervical adenopathy.  Skin:    Findings: No erythema or rash.  Neurological:     Mental Status: She is alert.  Psychiatric:        Mood and Affect: Mood normal.        Behavior: Behavior normal.      Outpatient Encounter Medications as of 07/01/2023  Medication Sig   amLODipine (NORVASC) 5 MG tablet Take 1 tablet (5 mg total) by mouth daily.   aspirin EC 81 MG tablet Take 81 mg by mouth daily. Swallow whole.   baclofen (LIORESAL) 10 MG tablet Take 1 tablet (10 mg total) by mouth 3 (three) times daily.   cholecalciferol (VITAMIN D3) 25 MCG (1000 UNIT) tablet Take 1,000 Units by mouth daily.   citalopram (CELEXA) 20 MG tablet Take 1 tablet (20 mg total) by mouth daily.   clopidogrel (PLAVIX) 75 MG tablet TAKE 1 TABLET BY MOUTH EVERY DAY   fluticasone (FLONASE) 50 MCG/ACT nasal spray Place 2 sprays into both nostrils daily. (Patient taking differently: Place 2 sprays into both nostrils as needed.)   losartan-hydrochlorothiazide (HYZAAR) 100-25 MG tablet Take 1 tablet by mouth daily.   potassium chloride (KLOR-CON) 10 MEQ tablet TAKE 1 TABLET BY MOUTH EVERY DAY   rosuvastatin (CRESTOR) 40 MG tablet Take 1 tablet (40 mg total) by mouth daily.   vitamin B-12 (CYANOCOBALAMIN) 1000 MCG tablet Take 1,000 mcg by mouth daily.   [DISCONTINUED] citalopram (CELEXA) 20 MG tablet Take 1 tablet (20 mg total) by mouth daily.   [DISCONTINUED] potassium chloride (KLOR-CON M) 10 MEQ tablet Take 10 mEq by mouth once.    No facility-administered encounter medications on file as of 07/01/2023.     Lab Results  Component Value Date   WBC 6.3 11/09/2022   HGB 14.3 11/09/2022   HCT 41.7 11/09/2022   PLT 232.0 11/09/2022  GLUCOSE 92 07/01/2023   CHOL 108 07/01/2023   TRIG 78.0 07/01/2023   HDL 41.00 07/01/2023   LDLDIRECT 129.0 03/26/2018   LDLCALC 51 07/01/2023   ALT 12 07/01/2023   AST 16 07/01/2023   NA 141 07/01/2023   K 3.7 07/01/2023   CL 102 07/01/2023   CREATININE 0.80 07/01/2023   BUN 13 07/01/2023   CO2 32 07/01/2023   TSH 1.92 11/09/2022   INR 1.0 06/01/2021   HGBA1C 5.6 07/01/2023    DG Bone Density  Result Date: 10/18/2022 EXAM: DUAL X-RAY ABSORPTIOMETRY (DXA) FOR BONE MINERAL DENSITY IMPRESSION: Your patient Rikka Paraiso completed a BMD test on 10/18/2022 using the [employer] iDXA DXA System (software version: 14.10) manufactured by [employer]. The following summarizes the results of our evaluation. Technologist::TNB PATIENT BIOGRAPHICAL: Name: Daisa, Les Patient ID: [id number] Birth Date: May 23, 1956 Height: 62.0 in. Gender: Female Exam Date: 10/18/2022 Weight: 134.8 lbs. Indications: Caucasian, Low Calcium Intake, Postmenopausal, Tobacco User, Vitamin D Deficiency Fractures: Treatments: Vitamin D DENSITOMETRY RESULTS: Site         Region     Measured Date Measured Age WHO Classification Young Adult T-score BMD         %Change vs. Previous Significant Change (*) DualFemur Neck Right 10/18/2023 65.4 Osteopenia -2.1 0.743 g/cm2 - - Left Forearm Radius 33% 10/18/2022 65.4 Osteoporosis -2.6 0.645 g/cm2 - - ASSESSMENT: The BMD measured at Forearm Radius 33% is 0.645 g/cm2 with a T-score of -2.6. This patient is considered osteoporotic according to World Health Organization [facility]) criteria. Lumbar spine was not utilized due to advanced degenerative changes. Patient is not a candidate for FRAX due to diagnosis of osteoporosis. The scan quality is good. World Science writer [facility])  criteria for post-menopausal, Caucasian Women: Normal:                   T-score at or above -1 SD Osteopenia/low bone mass: T-score between -1 and -2.5 SD Osteoporosis:             T-score at or below -2.5 SD RECOMMENDATIONS: 1. All patients should optimize calcium and vitamin D intake. 2. Consider FDA-approved medical therapies in postmenopausal women and men aged 36 years and older, based on the following: a. A hip or vertebral(clinical or morphometric) fracture b. T-score < -2.5 at the femoral neck or spine after appropriate evaluation to exclude secondary causes c. Low bone mass (T-score between -1.0 and -2.5 at the femoral neck or spine) and a 10-year probability of a hip fracture > 3% or a 10-year probability of a major osteoporosis-related fracture > 20% based on the [place]-adapted WHO algorithm 3. Clinician judgment and/or patient preferences may indicate treatment for people with 10-year fracture probabilities above or below these levels FOLLOW-UP: People with diagnosed cases of osteoporosis or at high risk for fracture should have regular bone mineral density tests. For patients eligible for Medicare, routine testing is allowed once every 2 years. The testing frequency can be increased to one year for patients who have rapidly progressing disease, those who are receiving or discontinuing medical therapy to restore bone mass, or have additional risk factors. I have reviewed this report, and agree with the above findings. [facility] Electronically Signed   By: Romona Curls M.D.   On: 10/18/2022 09:25       Assessment & Plan:  Left-sided back pain, unspecified back location, unspecified chronicity Assessment & Plan: Persistent increased pain.  Worse with standing or driving.  Pain with  palpation.  Previous rib xray - negative.  Taking baclofen - helping.  Controls pain if takes regularly, but is concerned regarding persistent pain when off baclofen.  Discussed further w/up and evaluation  given persistent pain.  Refer to ortho for further evaluation.   Orders: -     Ambulatory referral to Orthopedic Surgery  Essential hypertension, benign Assessment & Plan: On losartan/hctz and amlodipine.  Blood pressure as outlined.  Follow pressures.  Follow metabolic panel.   Orders: -     Basic metabolic panel  Hyperlipidemia, unspecified hyperlipidemia type -     Hepatic function panel -     Lipid panel  Hypercholesterolemia Assessment & Plan: On crestor 40mg  q day.  Low cholesterol diet and exercise.  Follow lipid panel and liver function tests.   Lab Results  Component Value Date   CHOL 108 07/01/2023   HDL 41.00 07/01/2023   LDLCALC 51 07/01/2023   LDLDIRECT 129.0 03/26/2018   TRIG 78.0 07/01/2023   CHOLHDL 3 07/01/2023      Hyperglycemia Assessment & Plan: Low carb diet and exercise.  Follow met b and a1c.   Orders: -     Hemoglobin A1c  PAD (peripheral artery disease) (HCC) Assessment & Plan: S/p stent (carotid) - followed by vascular surgery.  Continue risk factor modification.    Aortic atherosclerosis (HCC) Assessment & Plan: Continue atorvastatin.     Anxiety Assessment & Plan: Overall stable.  Continue citalopram.  Follow    Anemia, unspecified type Assessment & Plan: 11/09/22 - hgb wnl.     Bilateral carotid artery stenosis Assessment & Plan: 10/2022 - Carotid duplex today reveals a widely patent right carotid artery stent without significant recurrent stenosis and stable 1 to 39% left ICA stenosis. F/u one year.    Other orders -     Citalopram Hydrobromide; Take 1 tablet (20 mg total) by mouth daily.  Dispense: 90 tablet; Refill: 1     Dale Adams, MD

## 2023-07-01 NOTE — Assessment & Plan Note (Signed)
Low carb diet and exercise.  Follow met b and a1c.  

## 2023-07-02 DIAGNOSIS — S29012A Strain of muscle and tendon of back wall of thorax, initial encounter: Secondary | ICD-10-CM | POA: Insufficient documentation

## 2023-07-02 NOTE — Telephone Encounter (Signed)
Please refuse this. I refilled her citalopram at her visit and we are waiting on labs for potassium. Will fill in a separate encounter

## 2023-07-02 NOTE — Assessment & Plan Note (Signed)
Symptoms and exam consistent with muscle strain/spasm. Likely from previous fall. Left lat tight and tender on palpation. Will treat with Baclofen 10 mg TID PRN. She can continue Tylenol as needed for pain. She can alternate ice and heat. Advised OTC pain relief creams and patches as needed. Return precautions given to patient.

## 2023-07-08 DIAGNOSIS — K080 Exfoliation of teeth due to systemic causes: Secondary | ICD-10-CM | POA: Diagnosis not present

## 2023-07-17 ENCOUNTER — Other Ambulatory Visit: Payer: Medicare Other

## 2023-07-17 DIAGNOSIS — M25552 Pain in left hip: Secondary | ICD-10-CM | POA: Diagnosis not present

## 2023-07-17 DIAGNOSIS — M5414 Radiculopathy, thoracic region: Secondary | ICD-10-CM | POA: Diagnosis not present

## 2023-07-18 ENCOUNTER — Other Ambulatory Visit: Payer: Self-pay | Admitting: Orthopedic Surgery

## 2023-07-18 DIAGNOSIS — M5414 Radiculopathy, thoracic region: Secondary | ICD-10-CM

## 2023-07-19 ENCOUNTER — Encounter: Payer: Self-pay | Admitting: Internal Medicine

## 2023-07-19 ENCOUNTER — Ambulatory Visit (INDEPENDENT_AMBULATORY_CARE_PROVIDER_SITE_OTHER): Payer: Medicare Other | Admitting: Internal Medicine

## 2023-07-19 VITALS — BP 122/70 | HR 71 | Temp 98.0°F | Resp 16 | Ht 62.0 in | Wt 132.8 lb

## 2023-07-19 DIAGNOSIS — F419 Anxiety disorder, unspecified: Secondary | ICD-10-CM

## 2023-07-19 DIAGNOSIS — I6523 Occlusion and stenosis of bilateral carotid arteries: Secondary | ICD-10-CM

## 2023-07-19 DIAGNOSIS — M810 Age-related osteoporosis without current pathological fracture: Secondary | ICD-10-CM

## 2023-07-19 DIAGNOSIS — D649 Anemia, unspecified: Secondary | ICD-10-CM

## 2023-07-19 DIAGNOSIS — M5414 Radiculopathy, thoracic region: Secondary | ICD-10-CM

## 2023-07-19 DIAGNOSIS — R739 Hyperglycemia, unspecified: Secondary | ICD-10-CM

## 2023-07-19 DIAGNOSIS — I700 Atherosclerosis of aorta: Secondary | ICD-10-CM

## 2023-07-19 DIAGNOSIS — W5503XA Scratched by cat, initial encounter: Secondary | ICD-10-CM

## 2023-07-19 DIAGNOSIS — Z720 Tobacco use: Secondary | ICD-10-CM | POA: Diagnosis not present

## 2023-07-19 DIAGNOSIS — I10 Essential (primary) hypertension: Secondary | ICD-10-CM

## 2023-07-19 DIAGNOSIS — I739 Peripheral vascular disease, unspecified: Secondary | ICD-10-CM

## 2023-07-19 DIAGNOSIS — E7800 Pure hypercholesterolemia, unspecified: Secondary | ICD-10-CM | POA: Diagnosis not present

## 2023-07-19 MED ORDER — TIZANIDINE HCL 4 MG PO TABS: 4.0000 mg | ORAL_TABLET | Freq: Every evening | ORAL | 0 refills | Status: DC | PRN

## 2023-07-19 MED ORDER — POTASSIUM CHLORIDE ER 10 MEQ PO TBCR: 10.0000 meq | EXTENDED_RELEASE_TABLET | Freq: Every day | ORAL | 3 refills | Status: DC

## 2023-07-19 NOTE — Progress Notes (Unsigned)
Discussed the use of AI scribe software for clinical note transcription with the patient, who gave verbal consent to proceed.  History of Present Illness      Subjective:    Patient ID: Kim Love, female    DOB: 1955/09/29, 67 y.o.   MRN: [id number]  Patient here for  Chief Complaint  Patient presents with   Medical Management of Chronic Issues    HPI Here for follow up - f/u regarding hypercholesterolemia, PAD and hypertension. Recently evaluated for left flank pain. Saw ortho - felt to be c/w thoracic radiculopathy. Recommended MRI and f/u with physiatry.   Discussed the use of AI scribe software for clinical note transcription with the patient, who gave verbal consent to proceed.  History of Present Illness   Recently evaluated by Dr. Audelia Acton - left flank and left hip pain. Improved with baclofen.  States baclofen makes her feel loopy and she would like to try a different muscle relaxer.  The pain has improved recently. Felt to be c/w thoracic radiculopathy. Awaiting an MRI for further investigation.   The patient also reports a recent incident of a cat scratch, which has been treated with peroxide and bactroban.  Improved. Also reports increased stress. Increased stress related to her husband's health issues - back pain. Discussed. Tries to stay active.  No chest pain or sob reported.       Past Medical History:  Diagnosis Date   Anxiety    Hypercholesterolemia    Hypertension    Tobacco abuse    Past Surgical History:  Procedure Laterality Date   CAROTID PTA/STENT INTERVENTION Right 06/12/2021   Procedure: CAROTID PTA/STENT INTERVENTION;  Surgeon: Annice Needy, MD;  Location: [facility] IN[facility]IVE [facility];  Service: Cardiovascular;  Laterality: Right;   TONSILLECTOMY AND ADENOIDECTOMY Bilateral 1980   Family History  Problem Relation Age of Onset   Stroke Maternal Grandmother    Stroke Maternal Grandfather    Social History   Socioeconomic History   Marital status: Married     Spouse name: Merchant navy officer   Number of children: 1   Years of education: Not on file   Highest education level: Not on file  Occupational History    Employer: [employer]  Tobacco Use   Smoking status: Every Day    Current packs/day: 0.00    Average packs/day: 1 pack/day for 30.0 years (30.0 ttl pk-yrs)    Types: Cigarettes    Start date: 09/10/1982    Last attempt to quit: 09/10/2012    Years since quitting: 10.8   Smokeless tobacco: Never  Vaping Use   Vaping status: Never Used  Substance and Sexual Activity   Alcohol use: No    Comment: "once in a blue moon: I can't remember the last time."   Drug use: No   Sexual activity: Not on file  Other Topics Concern   Not on file  Social History Narrative   Lives at home with spouse       Right handed   Social Determinants of Health   Financial Resource Strain: Not on file  Food Insecurity: Not on file  Transportation Needs: Not on file  Physical Activity: Not on file  Stress: Not on file  Social Connections: Not on file     Review of Systems  Constitutional:  Negative for appetite change and unexpected weight change.  HENT:  Negative for congestion and sinus pressure.   Respiratory:  Negative for cough, chest tightness and shortness of breath.  Cardiovascular:  Negative for chest pain and palpitations.  Gastrointestinal:  Negative for abdominal pain, diarrhea, nausea and vomiting.  Genitourinary:  Negative for difficulty urinating and dysuria.  Musculoskeletal:  Negative for myalgias.       Flank pain/back pain as outlined.   Skin:  Negative for color change and rash.  Neurological:  Negative for dizziness and headaches.  Psychiatric/Behavioral:  Negative for agitation and dysphoric mood.        Objective:     BP 122/70   Pulse 71   Temp 98 F (36.7 C)   Resp 16   Ht 5\' 2"  (1.575 m)   Wt 132 lb 12.8 oz (60.2 kg)   SpO2 97%   BMI 24.29 kg/m  Wt Readings from Last 3 Encounters:  07/19/23 132 lb 12.8 oz (60.2  kg)  07/01/23 134 lb 3.2 oz (60.9 kg)  06/20/23 132 lb 6.4 oz (60.1 kg)    Physical Exam Vitals reviewed.  Constitutional:      General: She is not in acute distress.    Appearance: Normal appearance.  HENT:     Head: Normocephalic and atraumatic.     Right Ear: External ear normal.     Left Ear: External ear normal.  Eyes:     General: No scleral icterus.       Right eye: No discharge.        Left eye: No discharge.     Conjunctiva/sclera: Conjunctivae normal.  Neck:     Thyroid: No thyromegaly.  Cardiovascular:     Rate and Rhythm: Normal rate and regular rhythm.  Pulmonary:     Effort: No respiratory distress.     Breath sounds: Normal breath sounds. No wheezing.  Abdominal:     General: Bowel sounds are normal.     Palpations: Abdomen is soft.     Tenderness: There is no abdominal tenderness.  Musculoskeletal:        General: No swelling or tenderness.     Cervical back: Neck supple. No tenderness.  Lymphadenopathy:     Cervical: No cervical adenopathy.  Skin:    Findings: No erythema or rash.  Neurological:     Mental Status: She is alert.  Psychiatric:        Mood and Affect: Mood normal.        Behavior: Behavior normal.      Outpatient Encounter Medications as of 07/19/2023  Medication Sig   tiZANidine (ZANAFLEX) 4 MG tablet Take 1 tablet (4 mg total) by mouth at bedtime as needed for muscle spasms.   amLODipine (NORVASC) 5 MG tablet Take 1 tablet (5 mg total) by mouth daily.   aspirin EC 81 MG tablet Take 81 mg by mouth daily. Swallow whole.   cholecalciferol (VITAMIN D3) 25 MCG (1000 UNIT) tablet Take 1,000 Units by mouth daily.   citalopram (CELEXA) 20 MG tablet Take 1 tablet (20 mg total) by mouth daily.   clopidogrel (PLAVIX) 75 MG tablet TAKE 1 TABLET BY MOUTH EVERY DAY   fluticasone (FLONASE) 50 MCG/ACT nasal spray Place 2 sprays into both nostrils daily. (Patient taking differently: Place 2 sprays into both nostrils as needed.)    losartan-hydrochlorothiazide (HYZAAR) 100-25 MG tablet Take 1 tablet by mouth daily.   potassium chloride (KLOR-CON) 10 MEQ tablet Take 1 tablet (10 mEq total) by mouth daily.   rosuvastatin (CRESTOR) 40 MG tablet Take 1 tablet (40 mg total) by mouth daily.   vitamin B-12 (CYANOCOBALAMIN) 1000 MCG tablet Take 1,000 mcg  by mouth daily.   [DISCONTINUED] baclofen (LIORESAL) 10 MG tablet Take 1 tablet (10 mg total) by mouth 3 (three) times daily.   [DISCONTINUED] potassium chloride (KLOR-CON) 10 MEQ tablet TAKE 1 TABLET BY MOUTH EVERY DAY   No facility-administered encounter medications on file as of 07/19/2023.     Lab Results  Component Value Date   WBC 6.3 11/09/2022   HGB 14.3 11/09/2022   HCT 41.7 11/09/2022   PLT 232.0 11/09/2022   GLUCOSE 92 07/01/2023   CHOL 108 07/01/2023   TRIG 78.0 07/01/2023   HDL 41.00 07/01/2023   LDLDIRECT 129.0 03/26/2018   LDLCALC 51 07/01/2023   ALT 12 07/01/2023   AST 16 07/01/2023   NA 141 07/01/2023   K 3.7 07/01/2023   CL 102 07/01/2023   CREATININE 0.80 07/01/2023   BUN 13 07/01/2023   CO2 32 07/01/2023   TSH 1.92 11/09/2022   INR 1.0 06/01/2021   HGBA1C 5.6 07/01/2023    DG Bone Density  Result Date: 10/18/2022 EXAM: DUAL X-RAY ABSORPTIOMETRY (DXA) FOR BONE MINERAL DENSITY IMPRESSION: Your patient Camreigh Bobbitt completed a BMD test on 10/18/2022 using the [employer] iDXA DXA System (software version: 14.10) manufactured by [employer]. The following summarizes the results of our evaluation. Technologist::TNB PATIENT BIOGRAPHICAL: Name: Roniesha, Alsbrooks Patient ID: [id number] Birth Date: 22-Feb-1956 Height: 62.0 in. Gender: Female Exam Date: 10/18/2022 Weight: 134.8 lbs. Indications: Caucasian, Low Calcium Intake, Postmenopausal, Tobacco User, Vitamin D Deficiency Fractures: Treatments: Vitamin D DENSITOMETRY RESULTS: Site         Region     Measured Date Measured Age WHO Classification Young Adult T-score BMD         %Change vs. Previous  Significant Change (*) DualFemur Neck Right 10/18/2023 65.4 Osteopenia -2.1 0.743 g/cm2 - - Left Forearm Radius 33% 10/18/2022 65.4 Osteoporosis -2.6 0.645 g/cm2 - - ASSESSMENT: The BMD measured at Forearm Radius 33% is 0.645 g/cm2 with a T-score of -2.6. This patient is considered osteoporotic according to World Health Organization [facility] criteria. Lumbar spine was not utilized due to advanced degenerative changes. Patient is not a candidate for FRAX due to diagnosis of osteoporosis. The scan quality is good. World Science writer [facility]) criteria for post-menopausal, Caucasian Women: Normal:                   T-score at or above -1 SD Osteopenia/low bone mass: T-score between -1 and -2.5 SD Osteoporosis:             T-score at or below -2.5 SD RECOMMENDATIONS: 1. All patients should optimize calcium and vitamin D intake. 2. Consider FDA-approved medical therapies in postmenopausal women and men aged 71 years and older, based on the following: a. A hip or vertebral(clinical or morphometric) fracture b. T-score < -2.5 at the femoral neck or spine after appropriate evaluation to exclude secondary causes c. Low bone mass (T-score between -1.0 and -2.5 at the femoral neck or spine) and a 10-year probability of a hip fracture > 3% or a 10-year probability of a major osteoporosis-related fracture > 20% based on the [place]-adapted WHO algorithm 3. Clinician judgment and/or patient preferences may indicate treatment for people with 10-year fracture probabilities above or below these levels FOLLOW-UP: People with diagnosed cases of osteoporosis or at high risk for fracture should have regular bone mineral density tests. For patients eligible for Medicare, routine testing is allowed once every 2 years. The testing frequency can be increased to one year for patients who  have rapidly progressing disease, those who are receiving or discontinuing medical therapy to restore bone mass, or have additional risk factors. I have reviewed  this report, and agree with the above findings. [facility] Electronically Signed   By: Romona Curls M.D.   On: 10/18/2022 09:25       Assessment & Plan:  Hypercholesterolemia Assessment & Plan: On crestor 40mg  q day.  Low cholesterol diet and exercise.  Follow lipid panel and liver function tests.   Lab Results  Component Value Date   CHOL 108 07/01/2023   HDL 41.00 07/01/2023   LDLCALC 51 07/01/2023   LDLDIRECT 129.0 03/26/2018   TRIG 78.0 07/01/2023   CHOLHDL 3 07/01/2023     Orders: -     Lipid panel; Future -     Hepatic function panel; Future  Hyperglycemia Assessment & Plan: Low carb diet and exercise.  Follow met b and a1c.   Orders: -     Hemoglobin A1c; Future  Anemia, unspecified type Assessment & Plan: 11/09/22 - hgb wnl.    Orders: -     CBC with Differential/Platelet; Future -     Basic metabolic panel; Future -     TSH; Future  Tobacco abuse Assessment & Plan: Have discussed. Needs to quit smoking. Consider CT lung cancer screening.    PAD (peripheral artery disease) (HCC) Assessment & Plan: S/p stent (carotid) - followed by vascular surgery.  Continue risk factor modification.    Osteoporosis without current pathological fracture, unspecified osteoporosis type Assessment & Plan: Discussed bone density and osteoporosis.  Discussed treatment options.  She elects calcium, vitamin d and weight bearing exercise.  Has wanted to hold on prescription medication.  Follow.    Essential hypertension, benign Assessment & Plan: On losartan/hctz and amlodipine.  Blood pressure as outlined.  Follow pressures.  Follow metabolic panel.    Bilateral carotid artery stenosis Assessment & Plan: 10/2022 - Carotid duplex today reveals a widely patent right carotid artery stent without significant recurrent stenosis and stable 1 to 39% left ICA stenosis. F/u one year.    Aortic atherosclerosis (HCC) Assessment & Plan: Continue atorvastatin.      Anxiety Assessment & Plan: Increased stress. Discussed. Continue citalopram.  Follow    Cat scratch Assessment & Plan: Bactroban.  Follow.    Thoracic radiculopathy Assessment & Plan: Saw ortho. Planning for MRI.  Referred to physiatry. Stop baclofen.  Has helped.  Trial of tizanidine - see if tolerates better.  Follow.    Other orders -     Potassium Chloride ER; Take 1 tablet (10 mEq total) by mouth daily.  Dispense: 90 tablet; Refill: 3 -     tiZANidine HCl; Take 1 tablet (4 mg total) by mouth at bedtime as needed for muscle spasms.  Dispense: 20 tablet; Refill: 0     [place], MD

## 2023-07-21 ENCOUNTER — Encounter: Payer: Self-pay | Admitting: Internal Medicine

## 2023-07-21 DIAGNOSIS — W5503XA Scratched by cat, initial encounter: Secondary | ICD-10-CM | POA: Insufficient documentation

## 2023-07-21 DIAGNOSIS — M5414 Radiculopathy, thoracic region: Secondary | ICD-10-CM | POA: Insufficient documentation

## 2023-07-21 NOTE — Assessment & Plan Note (Signed)
Bactroban.  Follow.

## 2023-07-21 NOTE — Assessment & Plan Note (Addendum)
Increased stress. Discussed.  Continue citalopram.  Follow

## 2023-07-21 NOTE — Assessment & Plan Note (Signed)
On losartan/hctz and amlodipine.  Blood pressure as outlined.  Follow pressures.  Follow metabolic panel.  

## 2023-07-21 NOTE — Assessment & Plan Note (Signed)
S/p stent (carotid) - followed by vascular surgery.  Continue risk factor modification.  

## 2023-07-21 NOTE — Assessment & Plan Note (Signed)
Saw ortho. Planning for MRI.  Referred to physiatry. Stop baclofen.  Has helped.  Trial of tizanidine - see if tolerates better.  Follow.

## 2023-07-21 NOTE — Assessment & Plan Note (Signed)
Continue atorvastatin

## 2023-07-21 NOTE — Assessment & Plan Note (Signed)
10/2022 - Carotid duplex today reveals a widely patent right carotid artery stent without significant recurrent stenosis and stable 1 to 39% left ICA stenosis. F/u one year.

## 2023-07-21 NOTE — Assessment & Plan Note (Signed)
Have discussed. Needs to quit smoking. Consider CT lung cancer screening.

## 2023-07-21 NOTE — Assessment & Plan Note (Signed)
11/09/22 - hgb wnl.

## 2023-07-21 NOTE — Assessment & Plan Note (Signed)
Discussed bone density and osteoporosis.  Discussed treatment options.  She elects calcium, vitamin d and weight bearing exercise.  Has wanted to hold on prescription medication.  Follow.

## 2023-07-21 NOTE — Assessment & Plan Note (Signed)
On crestor 40mg  q day.  Low cholesterol diet and exercise.  Follow lipid panel and liver function tests.   Lab Results  Component Value Date   CHOL 108 07/01/2023   HDL 41.00 07/01/2023   LDLCALC 51 07/01/2023   LDLDIRECT 129.0 03/26/2018   TRIG 78.0 07/01/2023   CHOLHDL 3 07/01/2023

## 2023-07-21 NOTE — Assessment & Plan Note (Signed)
Low carb diet and exercise.  Follow met b and a1c.   

## 2023-07-25 ENCOUNTER — Encounter: Payer: Self-pay | Admitting: Orthopedic Surgery

## 2023-07-28 ENCOUNTER — Ambulatory Visit
Admission: RE | Admit: 2023-07-28 | Discharge: 2023-07-28 | Disposition: A | Discharge status: Home / Self Care | Payer: Medicare Other | Source: Ambulatory Visit | Attending: Orthopedic Surgery | Admitting: Orthopedic Surgery

## 2023-07-28 DIAGNOSIS — M5416 Radiculopathy, lumbar region: Secondary | ICD-10-CM | POA: Diagnosis not present

## 2023-07-28 DIAGNOSIS — M5414 Radiculopathy, thoracic region: Secondary | ICD-10-CM

## 2023-08-12 DIAGNOSIS — M5416 Radiculopathy, lumbar region: Secondary | ICD-10-CM | POA: Diagnosis not present

## 2023-08-12 DIAGNOSIS — M5134 Other intervertebral disc degeneration, thoracic region: Secondary | ICD-10-CM | POA: Diagnosis not present

## 2023-08-12 DIAGNOSIS — M5414 Radiculopathy, thoracic region: Secondary | ICD-10-CM | POA: Diagnosis not present

## 2023-08-12 DIAGNOSIS — M47814 Spondylosis without myelopathy or radiculopathy, thoracic region: Secondary | ICD-10-CM | POA: Diagnosis not present

## 2023-08-13 DIAGNOSIS — K080 Exfoliation of teeth due to systemic causes: Secondary | ICD-10-CM | POA: Diagnosis not present

## 2023-08-23 DIAGNOSIS — M545 Low back pain, unspecified: Secondary | ICD-10-CM | POA: Diagnosis not present

## 2023-08-28 ENCOUNTER — Telehealth: Payer: Self-pay

## 2023-08-28 DIAGNOSIS — E279 Disorder of adrenal gland, unspecified: Secondary | ICD-10-CM

## 2023-08-28 DIAGNOSIS — M545 Low back pain, unspecified: Secondary | ICD-10-CM | POA: Diagnosis not present

## 2023-08-28 NOTE — Telephone Encounter (Signed)
Copied from CRM [id number]. Topic: Clinical - Request for Lab/Test Order >> Aug 28, 2023  3:52 PM Elizebeth Brooking wrote: Reason for CRM: Alphonzo Lemmings called wanted to know if the ct scan has been order for the patient they are requesting a call back

## 2023-08-28 NOTE — Telephone Encounter (Signed)
Please call Kim Love and let her know that I have ordered a CT scan to f/u on the adrenal nodule noted on MRI.  This should not be contributing to her back pain, but does need to be worked up with further scanning.  Confirm agreeable to CT and let her know that someone should be contacting her with an appt. Also, please call Physiatry (Whitney at Dr Yves Dill) and let them know we have ordered CT - if pt agreeable to CT

## 2023-08-30 ENCOUNTER — Telehealth: Payer: Self-pay | Admitting: Internal Medicine

## 2023-08-30 NOTE — Telephone Encounter (Signed)
No vm set up. 

## 2023-08-30 NOTE — Telephone Encounter (Signed)
Called patient unable to leave message

## 2023-09-02 DIAGNOSIS — M545 Low back pain, unspecified: Secondary | ICD-10-CM | POA: Diagnosis not present

## 2023-09-02 NOTE — Telephone Encounter (Signed)
Patient aware and given number to radiology to schedule

## 2023-09-09 DIAGNOSIS — M545 Low back pain, unspecified: Secondary | ICD-10-CM | POA: Diagnosis not present

## 2023-09-10 ENCOUNTER — Ambulatory Visit
Admission: RE | Admit: 2023-09-10 | Discharge: 2023-09-10 | Disposition: A | Discharge status: Home / Self Care | Payer: Medicare Other | Source: Ambulatory Visit | Attending: Internal Medicine | Admitting: Internal Medicine

## 2023-09-10 DIAGNOSIS — I700 Atherosclerosis of aorta: Secondary | ICD-10-CM | POA: Insufficient documentation

## 2023-09-10 DIAGNOSIS — E279 Disorder of adrenal gland, unspecified: Secondary | ICD-10-CM | POA: Diagnosis not present

## 2023-09-10 DIAGNOSIS — D3502 Benign neoplasm of left adrenal gland: Secondary | ICD-10-CM | POA: Diagnosis not present

## 2023-09-10 DIAGNOSIS — R932 Abnormal findings on diagnostic imaging of liver and biliary tract: Secondary | ICD-10-CM | POA: Diagnosis not present

## 2023-09-10 DIAGNOSIS — N132 Hydronephrosis with renal and ureteral calculous obstruction: Secondary | ICD-10-CM | POA: Diagnosis not present

## 2023-09-10 DIAGNOSIS — K769 Liver disease, unspecified: Secondary | ICD-10-CM | POA: Diagnosis not present

## 2023-09-13 DIAGNOSIS — M545 Low back pain, unspecified: Secondary | ICD-10-CM | POA: Diagnosis not present

## 2023-09-18 DIAGNOSIS — M545 Low back pain, unspecified: Secondary | ICD-10-CM | POA: Diagnosis not present

## 2023-09-24 ENCOUNTER — Other Ambulatory Visit: Payer: Self-pay

## 2023-09-24 DIAGNOSIS — I10 Essential (primary) hypertension: Secondary | ICD-10-CM

## 2023-09-25 ENCOUNTER — Other Ambulatory Visit: Payer: Self-pay | Admitting: Internal Medicine

## 2023-09-25 DIAGNOSIS — M545 Low back pain, unspecified: Secondary | ICD-10-CM | POA: Diagnosis not present

## 2023-09-25 DIAGNOSIS — K7689 Other specified diseases of liver: Secondary | ICD-10-CM

## 2023-09-25 NOTE — Progress Notes (Unsigned)
 Order placed for right upper quadrant ultrasound.

## 2023-09-26 ENCOUNTER — Telehealth: Payer: Self-pay

## 2023-09-26 DIAGNOSIS — N200 Calculus of kidney: Secondary | ICD-10-CM

## 2023-09-26 DIAGNOSIS — E279 Disorder of adrenal gland, unspecified: Secondary | ICD-10-CM

## 2023-09-26 DIAGNOSIS — N1330 Unspecified hydronephrosis: Secondary | ICD-10-CM

## 2023-09-26 NOTE — Telephone Encounter (Addendum)
I have pended referrals for you to type in the comments and sign. She wants to go to [facility] endocrinology and [facility]). There was a result note about referring her to both.

## 2023-09-26 NOTE — Telephone Encounter (Signed)
Copied from CRM [id number]. Topic: Referral - Question >> Sep 26, 2023  8:12 AM Truddie Crumble wrote: Reason for CRM: Pt called stating she would like to be called in regards to a referral for an endocrinology and urology

## 2023-09-26 NOTE — Addendum Note (Signed)
Addended by: Rita Ohara D on: 09/26/2023 01:28 PM   Modules accepted: Orders

## 2023-09-26 NOTE — Telephone Encounter (Signed)
Copied from CRM [id number]. Topic: Clinical - Lab/Test Results >> Sep 26, 2023 11:34 AM Steele Sizer wrote: Reason for CRM: Pt stated that she is needing a callback from her PCP or PCP nurse to discuss a  CT Scan she received finding out about a kidney stone she has, who her PCP is going to refer her to.

## 2023-09-27 ENCOUNTER — Encounter: Payer: Self-pay | Admitting: Internal Medicine

## 2023-09-27 ENCOUNTER — Ambulatory Visit: Payer: Self-pay | Admitting: Internal Medicine

## 2023-09-27 ENCOUNTER — Other Ambulatory Visit: Payer: Medicare Other

## 2023-09-27 ENCOUNTER — Ambulatory Visit (INDEPENDENT_AMBULATORY_CARE_PROVIDER_SITE_OTHER): Payer: Medicare Other | Admitting: Internal Medicine

## 2023-09-27 VITALS — BP 122/70 | HR 78 | Temp 98.7°F | Resp 16 | Ht 62.0 in | Wt 133.4 lb

## 2023-09-27 DIAGNOSIS — R051 Acute cough: Secondary | ICD-10-CM

## 2023-09-27 DIAGNOSIS — I10 Essential (primary) hypertension: Secondary | ICD-10-CM

## 2023-09-27 DIAGNOSIS — D3500 Benign neoplasm of unspecified adrenal gland: Secondary | ICD-10-CM

## 2023-09-27 DIAGNOSIS — R509 Fever, unspecified: Secondary | ICD-10-CM

## 2023-09-27 DIAGNOSIS — K7689 Other specified diseases of liver: Secondary | ICD-10-CM

## 2023-09-27 DIAGNOSIS — N200 Calculus of kidney: Secondary | ICD-10-CM | POA: Diagnosis not present

## 2023-09-27 LAB — BASIC METABOLIC PANEL
BUN: 11 mg/dL (ref 6–23)
CO2: 32 meq/L (ref 19–32)
Calcium: 8.8 mg/dL (ref 8.4–10.5)
Chloride: 97 meq/L (ref 96–112)
Creatinine, Ser: 0.79 mg/dL (ref 0.40–1.20)
GFR: 77.99 mL/min (ref >60.00)
Glucose, Bld: 85 mg/dL (ref 70–99)
Potassium: 3.3 meq/L — ABNORMAL LOW (ref 3.5–5.1)
Sodium: 135 meq/L (ref 135–145)

## 2023-09-27 LAB — POCT INFLUENZA A/B
Influenza A, POC: NEGATIVE
Influenza B, POC: NEGATIVE

## 2023-09-27 LAB — POC COVID19 BINAXNOW: SARS Coronavirus 2 Ag: NEGATIVE

## 2023-09-27 MED ORDER — AMOXICILLIN-POT CLAVULANATE 875-125 MG PO TABS: 1.0000 | ORAL_TABLET | Freq: Two times a day (BID) | ORAL | 0 refills | Status: DC

## 2023-09-27 NOTE — Telephone Encounter (Signed)
Order placed for right upper quadrant ultrasound, endocrinology referral and urology referral.

## 2023-09-27 NOTE — Progress Notes (Unsigned)
Subjective:    Patient ID: Kim Love, female    DOB: 1956-09-08, 68 y.o.   MRN: [id number]  Patient here for  Chief Complaint  Patient presents with   Nasal Congestion    HPI Here for a work in appt - work in with concerns regarding increased cough and congestion. States symptoms started two days ago. Noticed headache  - head feels stopped up. Ears stopped up. Increased nasal congestion and drainage. Fever - Tmax - 101.5. no chest pain or sob. No vomiting or diarrhea. Family has been sick. Taking tylenol.    Past Medical History:  Diagnosis Date   Anxiety    Hypercholesterolemia    Hypertension    Tobacco abuse    Past Surgical History:  Procedure Laterality Date   CAROTID PTA/STENT INTERVENTION Right 06/12/2021   Procedure: CAROTID PTA/STENT INTERVENTION;  Surgeon: Annice Needy, MD;  Location: [facility] IN[facility]IVE [facility];  Service: Cardiovascular;  Laterality: Right;   TONSILLECTOMY AND ADENOIDECTOMY Bilateral 1980   Family History  Problem Relation Age of Onset   Stroke Maternal Grandmother    Stroke Maternal Grandfather    Social History   Socioeconomic History   Marital status: Married    Spouse name: Merchant navy officer   Number of children: 1   Years of education: Not on file   Highest education level: Not on file  Occupational History    Employer: [employer]  Tobacco Use   Smoking status: Every Day    Current packs/day: 0.00    Average packs/day: 1 pack/day for 30.0 years (30.0 ttl pk-yrs)    Types: Cigarettes    Start date: 09/10/1982    Last attempt to quit: 09/10/2012    Years since quitting: 11.0   Smokeless tobacco: Never  Vaping Use   Vaping status: Never Used  Substance and Sexual Activity   Alcohol use: No    Comment: "once in a blue moon: I can't remember the last time."   Drug use: No   Sexual activity: Not on file  Other Topics Concern   Not on file  Social History Narrative   Lives at home with spouse       Right handed   Social Drivers of Health    Financial Resource Strain: Medium Risk (08/12/2023)   Received from [facility]   Overall Financial Resource Strain (CARDIA)    Difficulty of Paying Living Expenses: Somewhat hard  Food Insecurity: Food Insecurity Present (08/12/2023)   Received from [facility]   Hunger Vital Sign    Worried About Running Out of Food in the Last Year: Sometimes true    Ran Out of Food in the Last Year: Sometimes true  Transportation Needs: No Transportation Needs (08/12/2023)   Received from [facility] - Transportation    In the past 12 months, has lack of transportation kept you from medical appointments or from getting medications?: No    Lack of Transportation (Non-Medical): No  Physical Activity: Not on file  Stress: Not on file  Social Connections: Not on file     Review of Systems  Constitutional:  Positive for fever. Negative for appetite change and unexpected weight change.  HENT:  Positive for congestion, postnasal drip and sinus pressure.   Respiratory:  Positive for cough. Negative for chest tightness and shortness of breath.   Cardiovascular:  Negative for chest pain and palpitations.  Gastrointestinal:  Negative for abdominal pain, diarrhea and vomiting.  Genitourinary:  Negative for difficulty urinating and dysuria.  Musculoskeletal:  Negative for joint swelling and myalgias.  Skin:  Negative for color change and rash.  Neurological:  Negative for dizziness and headaches.  Psychiatric/Behavioral:  Negative for agitation and dysphoric mood.        Increased stress.         Objective:     BP 122/70   Pulse 78   Temp 98.7 F (37.1 C) (Oral)   Resp 16   Ht 5\' 2"  (1.575 m)   Wt 133 lb 6.4 oz (60.5 kg)   SpO2 98%   BMI 24.40 kg/m  Wt Readings from Last 3 Encounters:  09/27/23 133 lb 6.4 oz (60.5 kg)  07/19/23 132 lb 12.8 oz (60.2 kg)  07/01/23 134 lb 3.2 oz (60.9 kg)    Physical Exam Vitals reviewed.   Constitutional:      General: She is not in acute distress.    Appearance: Normal appearance.  HENT:     Head: Normocephalic and atraumatic.     Comments: Increased pressure - sinuses.     Right Ear: External ear normal.     Left Ear: External ear normal.     Mouth/Throat:     Pharynx: No oropharyngeal exudate or posterior oropharyngeal erythema.  Eyes:     General: No scleral icterus.       Right eye: No discharge.        Left eye: No discharge.     Conjunctiva/sclera: Conjunctivae normal.  Neck:     Thyroid: No thyromegaly.  Cardiovascular:     Rate and Rhythm: Normal rate and regular rhythm.  Pulmonary:     Effort: No respiratory distress.     Breath sounds: Normal breath sounds. No wheezing.  Abdominal:     General: Bowel sounds are normal.     Palpations: Abdomen is soft.     Tenderness: There is no abdominal tenderness.  Musculoskeletal:        General: No swelling or tenderness.     Cervical back: Neck supple. No tenderness.  Lymphadenopathy:     Cervical: No cervical adenopathy.  Skin:    Findings: No erythema or rash.  Neurological:     Mental Status: She is alert.  Psychiatric:        Mood and Affect: Mood normal.        Behavior: Behavior normal.         Outpatient Encounter Medications as of 09/27/2023  Medication Sig   amoxicillin-clavulanate (AUGMENTIN) 875-125 MG tablet Take 1 tablet by mouth 2 (two) times daily.   amLODipine (NORVASC) 5 MG tablet Take 1 tablet (5 mg total) by mouth daily.   aspirin EC 81 MG tablet Take 81 mg by mouth daily. Swallow whole.   cholecalciferol (VITAMIN D3) 25 MCG (1000 UNIT) tablet Take 1,000 Units by mouth daily.   citalopram (CELEXA) 20 MG tablet Take 1 tablet (20 mg total) by mouth daily.   clopidogrel (PLAVIX) 75 MG tablet TAKE 1 TABLET BY MOUTH EVERY DAY   fluticasone (FLONASE) 50 MCG/ACT nasal spray Place 2 sprays into both nostrils daily. (Patient taking differently: Place 2 sprays into both nostrils as needed.)    losartan-hydrochlorothiazide (HYZAAR) 100-25 MG tablet Take 1 tablet by mouth daily.   potassium chloride (KLOR-CON) 10 MEQ tablet Take 1 tablet (10 mEq total) by mouth daily.   rosuvastatin (CRESTOR) 40 MG tablet Take 1 tablet (40 mg total) by mouth daily.   tiZANidine (ZANAFLEX) 4 MG  tablet Take 1 tablet (4 mg total) by mouth at bedtime as needed for muscle spasms.   vitamin B-12 (CYANOCOBALAMIN) 1000 MCG tablet Take 1,000 mcg by mouth daily.   No facility-administered encounter medications on file as of 09/27/2023.     Lab Results  Component Value Date   WBC 6.3 11/09/2022   HGB 14.3 11/09/2022   HCT 41.7 11/09/2022   PLT 232.0 11/09/2022   GLUCOSE 85 09/27/2023   CHOL 108 07/01/2023   TRIG 78.0 07/01/2023   HDL 41.00 07/01/2023   LDLDIRECT 129.0 03/26/2018   LDLCALC 51 07/01/2023   ALT 12 07/01/2023   AST 16 07/01/2023   NA 135 09/27/2023   K 3.3 (L) 09/27/2023   CL 97 09/27/2023   CREATININE 0.79 09/27/2023   BUN 11 09/27/2023   CO2 32 09/27/2023   TSH 1.92 11/09/2022   INR 1.0 06/01/2021   HGBA1C 5.6 07/01/2023    CT ADRENAL ABDOMEN WO CONTRAST Result Date: 09/20/2023 CLINICAL DATA:  Bilateral adrenal gland thickening seen on recent lumbar spine MRI. EXAM: CT ABDOMEN WITHOUT CONTRAST TECHNIQUE: Multidetector CT imaging of the abdomen was performed following the standard protocol without IV contrast. RADIATION DOSE REDUCTION: This exam was performed according to the departmental dose-optimization program which includes automated exposure control, adjustment of the mA and/or kV according to patient size and/or use of iterative reconstruction technique. COMPARISON:  Lumbar spine MRI dated 07/28/2023. FINDINGS: Evaluation of this exam is limited in the absence of intravenous contrast. Lower chest: The visualized lung bases are clear. Hepatobiliary: Small hypodense lesions in the left lobe of the liver measure up to 2.5 cm, suboptimally evaluated on this noncontrast CT, likely  cysts. Similar findings were described on ultrasound of 12/15/2009. The ultrasound images however are not available for direct comparison. Repeat ultrasound recommended for better evaluation of liver lesions. The gallbladder is unremarkable. Pancreas: Unremarkable. No pancreatic ductal dilatation or surrounding inflammatory changes. Spleen: Normal in size without focal abnormality. Adrenals/Urinary Tract: Mild bilateral adrenal thickening/hyperplasia. A 1.7 cm left adrenal nodule demonstrates negative attenuation and Hounsfield unit consistent with an adenoma. A 1.7 cm stone in the left renal pelvis with mild left hydronephrosis. Additional 3 mm nonobstructing left renal inferior pole calculus. There is no hydronephrosis or nephrolithiasis on the right. The visualized ureters appear unremarkable. Stomach/Bowel: No bowel obstruction or dilatation in the visualized abdomen. Vascular/Lymphatic: Moderate aortoiliac atherosclerotic disease. The IVC is unremarkable. No portal venous gas. There is no adenopathy. Other: None Musculoskeletal: Bilateral L5 pars defects with grade 1 anterolisthesis. No acute osseous pathology. IMPRESSION: 1. A 1.7 cm stone in the left renal pelvis with mild left hydronephrosis. Additional 3 mm nonobstructing left renal inferior pole calculus. 2. Mild bilateral adrenal thickening/hyperplasia. A 1.7 cm left adrenal adenoma. 3. Probable small liver cysts. Further evaluation with ultrasound recommended. 4.  Aortic Atherosclerosis (ICD10-I70.0). Electronically Signed   By: Elgie Collard M.D.   On: 09/20/2023 17:02       Assessment & Plan:  Acute cough Assessment & Plan: Increased cough, congestion and fever as outlined.  Covid and flu swab negative. Discussed probably viral syndrome. Treat with saline nasal spray and steroid nasal spray as directed.  Robitussin DM. Rx given for augmentin - to have if symptoms worsened.   Orders: -     POC COVID-19 BinaxNow -     POCT Influenza  A/B  Fever, unspecified fever cause -     POC COVID-19 BinaxNow -     POCT Influenza A/B  Essential  hypertension, benign Assessment & Plan: On losartan/hctz and amlodipine.  Blood pressure as outlined.  Follow pressures.  Follow metabolic panel.   Orders: -     Basic metabolic panel  Nephrolithiasis Assessment & Plan: Recent CT - A 1.7 cm stone in the left renal pelvis with mild left hydronephrosis. Additional 3 mm nonobstructing left renal inferior pole calculus. Discussed.  Refer to urology. Check met b today to confirm normal renal function.    Adrenal adenoma, unspecified laterality Assessment & Plan: Recent CT - Mild bilateral adrenal thickening/hyperplasia. A 1.7 cm left adrenal adenoma. Refer to endocrinology for further w/up and evaluation.  .   Liver cyst Assessment & Plan: Recent CT - Probable small liver cysts. Further evaluation with ultrasound recommended.discussed.  scheduled for abdominal ultrasound.    Other orders -     Amoxicillin-Pot Clavulanate; Take 1 tablet by mouth 2 (two) times daily.  Dispense: 14 tablet; Refill: 0     Dale [place], MD

## 2023-09-27 NOTE — Telephone Encounter (Signed)
Pt scheduled today at 1pm for visit

## 2023-09-27 NOTE — Telephone Encounter (Signed)
Chief Complaint: congestion Symptoms: headache, pain between ears, cough, drainage, fever Frequency: onset two days ago Pertinent Negatives: Patient denies CP, SOB Disposition: [] ED /[] Urgent Care (no appt availability in office) / [x] Appointment(In office/virtual)/ []  [place] [facility]/ [] Home Care/ [] Refused Recommended Disposition /[] [place] [facility]/ []  Follow-up with PCP Additional Notes: Pt reports two days of sinus congestion, headache, pain between the ears, fever, and post nasal drip. Pt denies CP or SOB/difficulty breathing. Per protocol, pt scheduled an appt for today 09/27/2023 at 1300. Pt advised to call back for worsening symptoms. Pt verbalizes understanding.   Copied from CRM [id number]. Topic: Clinical - Red Word Triage >> Sep 27, 2023  8:58 AM Drema Balzarine wrote: Red Word that prompted transfer to Nurse Triage: Patient feels sick, 101 fever, stuffy head, neck swollen, and cough Reason for Disposition  [1] Sinus pain (not just congestion) AND [2] fever  Answer Assessment - Initial Assessment Questions 1. LOCATION: "Where does it hurt?"      Head, in between ears 2. ONSET: "When did the sinus pain start?"  (e.g., hours, days)      2 days ago 3. SEVERITY: "How bad is the pain?"   (Scale 1-10; mild, moderate or severe)   - MILD (1-3): doesn't interfere with normal activities    - MODERATE (4-7): interferes with normal activities (e.g., work or school) or awakens from sleep   - SEVERE (8-10): excruciating pain and patient unable to do any normal activities        4/10 4. RECURRENT SYMPTOM: "Have you ever had sinus problems before?" If Yes, ask: "When was the last time?" and "What happened that time?"      "Usually get it every fall" 5. NASAL CONGESTION: "Is the nose blocked?" If Yes, ask: "Can you open it or must you breathe through your mouth?"     "I suppose" 6. NASAL DISCHARGE: "Do you have discharge from your nose?" If so ask, "What color?"     Clear drainage 7.  FEVER: "Do you have a fever?" If Yes, ask: "What is it, how was it measured, and when did it start?"      Fever this AM of 101F with forehead thermometer 8. OTHER SYMPTOMS: "Do you have any other symptoms?" (e.g., sore throat, cough, earache, difficulty breathing)     Nasal drainage, cough, pain between ears, chills  Protocols used: Sinus Pain or Congestion-A-AH

## 2023-09-27 NOTE — Addendum Note (Signed)
Addended by: Charm Barges on: 09/27/2023 06:09 AM   Modules accepted: Orders

## 2023-09-27 NOTE — Patient Instructions (Signed)
Saline nasal spray - flush nose 1-2x/day  Flonase (or nasacort) nasal spray - 2 sprays each nostril one time per day. Do this in the evening.   Robitussin DM - for cough and congestion.

## 2023-09-27 NOTE — Telephone Encounter (Signed)
Noted  

## 2023-09-28 ENCOUNTER — Encounter: Payer: Self-pay | Admitting: Internal Medicine

## 2023-09-28 DIAGNOSIS — N200 Calculus of kidney: Secondary | ICD-10-CM | POA: Insufficient documentation

## 2023-09-28 DIAGNOSIS — K7689 Other specified diseases of liver: Secondary | ICD-10-CM | POA: Insufficient documentation

## 2023-09-28 DIAGNOSIS — D3500 Benign neoplasm of unspecified adrenal gland: Secondary | ICD-10-CM | POA: Insufficient documentation

## 2023-09-28 DIAGNOSIS — R059 Cough, unspecified: Secondary | ICD-10-CM | POA: Insufficient documentation

## 2023-09-28 NOTE — Addendum Note (Signed)
Addended by: Charm Barges on: 09/28/2023 09:03 PM   Modules accepted: Level of Service

## 2023-09-28 NOTE — Assessment & Plan Note (Signed)
Increased cough, congestion and fever as outlined.  Covid and flu swab negative. Discussed probably viral syndrome. Treat with saline nasal spray and steroid nasal spray as directed.  Robitussin DM. Rx given for augmentin - to have if symptoms worsened.

## 2023-09-28 NOTE — Assessment & Plan Note (Signed)
Recent CT - Probable small liver cysts. Further evaluation with ultrasound recommended.discussed.  scheduled for abdominal ultrasound.

## 2023-09-28 NOTE — Assessment & Plan Note (Signed)
Recent CT - A 1.7 cm stone in the left renal pelvis with mild left hydronephrosis. Additional 3 mm nonobstructing left renal inferior pole calculus. Discussed.  Refer to urology. Check met b today to confirm normal renal function.

## 2023-09-28 NOTE — Assessment & Plan Note (Signed)
Recent CT - Mild bilateral adrenal thickening/hyperplasia. A 1.7 cm left adrenal adenoma. Refer to endocrinology for further w/up and evaluation.  Marland Kitchen

## 2023-09-28 NOTE — Assessment & Plan Note (Signed)
On losartan/hctz and amlodipine.  Blood pressure as outlined.  Follow pressures.  Follow metabolic panel.  

## 2023-10-01 ENCOUNTER — Other Ambulatory Visit: Payer: Medicare Other

## 2023-10-01 ENCOUNTER — Other Ambulatory Visit: Payer: Self-pay

## 2023-10-01 DIAGNOSIS — E876 Hypokalemia: Secondary | ICD-10-CM

## 2023-10-14 ENCOUNTER — Other Ambulatory Visit (INDEPENDENT_AMBULATORY_CARE_PROVIDER_SITE_OTHER): Payer: Medicare Other

## 2023-10-14 DIAGNOSIS — E876 Hypokalemia: Secondary | ICD-10-CM

## 2023-10-14 LAB — POTASSIUM: Potassium: 4 meq/L (ref 3.5–5.1)

## 2023-10-15 ENCOUNTER — Encounter: Payer: Self-pay | Admitting: Urology

## 2023-10-15 ENCOUNTER — Ambulatory Visit
Admission: RE | Admit: 2023-10-15 | Discharge: 2023-10-15 | Disposition: A | Discharge status: Home / Self Care | Payer: Medicare Other | Source: Ambulatory Visit | Attending: Internal Medicine | Admitting: Internal Medicine

## 2023-10-15 ENCOUNTER — Ambulatory Visit: Payer: Medicare Other | Admitting: Urology

## 2023-10-15 VITALS — BP 119/69 | HR 70 | Ht 62.0 in | Wt 130.0 lb

## 2023-10-15 DIAGNOSIS — Z01818 Encounter for other preprocedural examination: Secondary | ICD-10-CM

## 2023-10-15 DIAGNOSIS — N200 Calculus of kidney: Secondary | ICD-10-CM

## 2023-10-15 DIAGNOSIS — N1330 Unspecified hydronephrosis: Secondary | ICD-10-CM | POA: Diagnosis not present

## 2023-10-15 DIAGNOSIS — K829 Disease of gallbladder, unspecified: Secondary | ICD-10-CM | POA: Diagnosis not present

## 2023-10-15 DIAGNOSIS — K7689 Other specified diseases of liver: Secondary | ICD-10-CM | POA: Diagnosis not present

## 2023-10-15 LAB — URINALYSIS, COMPLETE
Bilirubin, UA: NEGATIVE
Glucose, UA: NEGATIVE
Ketones, UA: NEGATIVE
Nitrite, UA: NEGATIVE
Specific Gravity, UA: 1.015 (ref 1.005–1.030)
Urobilinogen, Ur: 1 mg/dL (ref 0.2–1.0)
pH, UA: 7.5 (ref 5.0–7.5)

## 2023-10-15 LAB — MICROSCOPIC EXAMINATION

## 2023-10-15 NOTE — Progress Notes (Signed)
 I, Amy L Pierron, acting as a scribe for Rosina Riis, MD.,have documented all relevant documentation on the behalf of Rosina Riis, MD,as directed by  Rosina Riis, MD while in the presence of Rosina Riis, MD.  10/15/2023 12:00 PM   Kim Love 09-17-1955 [id number]  Referring provider: Glendia Shad, MD [address] [place],  [place] [address]  Chief Complaint  Patient presents with   Establish Care   Hydronephrosis    HPI: 68 year-old female presents today further evaluation of a kidney stone.  She underwent a CT adrenal protocol for incidental bilateral thickening of her adrenal glands. On the study she was found to have a 1.7-centimeter left renal stone and left hydronephrosis with an additional 3-millimeter lower pole stone. Right side was unremarkable.   Labs were checked by her primary care and noted to be within normal limits.  Urinalysis today and results pending.  She is accompanied today with her husband.   She mentions in June of last year she fell when trying to sit down which injured her back and left flank which has been hurting since. The doctor thought she may have bruised or cracked a rib. The pain didn't subside and ultimately found she had a kidney stone. The pain radiates to her left lower quadrant as well.   She has a personal history of stroke and is on Plavix .  PMH: Past Medical History:  Diagnosis Date   Anxiety    Hypercholesterolemia    Hypertension    Tobacco abuse     Surgical History: Past Surgical History:  Procedure Laterality Date   CAROTID PTA/STENT INTERVENTION Right 06/12/2021   Procedure: CAROTID PTA/STENT INTERVENTION;  Surgeon: Marea Selinda RAMAN, MD;  Location: [facility];  Service: Cardiovascular;  Laterality: Right;   TONSILLECTOMY AND ADENOIDECTOMY Bilateral 1980    Home Medications:  Allergies as of 10/15/2023       Reactions   Povidone-iodine Swelling   Sulfamethoxazole-trimethoprim  Nausea And Vomiting   Atenolol Other (See Comments)   Bradycardia   Codeine    REACTION: gallbladder issues   Erythromycin    REACTION: nausea   Erythromycin    Upset stomach   Olmesartan    Other reaction(s): Other (See Comments) Increased BP.   Sulfa Antibiotics    Codeine    Gallbladder issues        Medication List        Accurate as of October 15, 2023 12:00 PM. If you have any questions, ask your nurse or doctor.          STOP taking these medications    amoxicillin -clavulanate 875-125 MG tablet Commonly known as: AUGMENTIN  Stopped by: Rosina Riis   tiZANidine  4 MG tablet Commonly known as: Zanaflex  Stopped by: Rosina Riis       TAKE these medications    amLODipine  5 MG tablet Commonly known as: NORVASC  Take 1 tablet (5 mg total) by mouth daily.   aspirin  EC 81 MG tablet Take 81 mg by mouth daily. Swallow whole.   cholecalciferol  25 MCG (1000 UNIT) tablet Commonly known as: VITAMIN D3 Take 1,000 Units by mouth daily.   citalopram  20 MG tablet Commonly known as: CELEXA  Take 1 tablet (20 mg total) by mouth daily.   clopidogrel  75 MG tablet Commonly known as: PLAVIX  TAKE 1 TABLET BY MOUTH EVERY DAY   cyanocobalamin  1000 MCG tablet Commonly known as: VITAMIN B12 Take 1,000 mcg by mouth daily.   fluticasone  50 MCG/ACT nasal  spray Commonly known as: FLONASE  Place 2 sprays into both nostrils daily. What changed:  when to take this reasons to take this   losartan -hydrochlorothiazide  100-25 MG tablet Commonly known as: HYZAAR Take 1 tablet by mouth daily.   potassium chloride  10 MEQ tablet Commonly known as: KLOR-CON  Take 1 tablet (10 mEq total) by mouth daily.   rosuvastatin  40 MG tablet Commonly known as: CRESTOR  Take 1 tablet (40 mg total) by mouth daily.        Allergies:  Allergies  Allergen Reactions   Povidone-Iodine Swelling   Sulfamethoxazole-Trimethoprim Nausea And Vomiting   Atenolol Other (See Comments)     Bradycardia   Codeine     REACTION: gallbladder issues   Erythromycin     REACTION: nausea   Erythromycin     Upset stomach   Olmesartan     Other reaction(s): Other (See Comments) Increased BP.   Sulfa Antibiotics    Codeine     Gallbladder issues    Family History: Family History  Problem Relation Age of Onset   Stroke Maternal Grandmother    Stroke Maternal Grandfather     Social History:  reports that she has been smoking cigarettes. She started smoking about 41 years ago. She has a 30 pack-year smoking history. She has never used smokeless tobacco. She reports that she does not drink alcohol and does not use drugs.   Physical Exam: BP 119/69   Pulse 70   Ht 5' 2 (1.575 m)   Wt 130 lb (59 kg)   BMI 23.78 kg/m   Constitutional:  Alert and oriented, No acute distress. HEENT: [place] AT, moist mucus membranes.  Trachea midline, no masses. Neurologic: Grossly intact, no focal deficits, moving all 4 extremities. Psychiatric: Normal mood and affect.   Pertinent Imaging: Narrative & Impression  CLINICAL DATA:  Bilateral adrenal gland thickening seen on recent lumbar spine MRI.   EXAM: CT ABDOMEN WITHOUT CONTRAST   TECHNIQUE: Multidetector CT imaging of the abdomen was performed following the standard protocol without IV contrast.   RADIATION DOSE REDUCTION: This exam was performed according to the departmental dose-optimization program which includes automated exposure control, adjustment of the mA and/or kV according to patient size and/or use of iterative reconstruction technique.   COMPARISON:  Lumbar spine MRI dated 07/28/2023.   FINDINGS: Evaluation of this exam is limited in the absence of intravenous contrast.   Lower chest: The visualized lung bases are clear.   Hepatobiliary: Small hypodense lesions in the left lobe of the liver measure up to 2.5 cm, suboptimally evaluated on this noncontrast CT, likely cysts. Similar findings were described on  ultrasound of 12/15/2009. The ultrasound images however are not available for direct comparison. Repeat ultrasound recommended for better evaluation of liver lesions. The gallbladder is unremarkable.   Pancreas: Unremarkable. No pancreatic ductal dilatation or surrounding inflammatory changes.   Spleen: Normal in size without focal abnormality.   Adrenals/Urinary Tract: Mild bilateral adrenal thickening/hyperplasia. A 1.7 cm left adrenal nodule demonstrates negative attenuation and Hounsfield unit consistent with an adenoma. A 1.7 cm stone in the left renal pelvis with mild left hydronephrosis. Additional 3 mm nonobstructing left renal inferior pole calculus. There is no hydronephrosis or nephrolithiasis on the right. The visualized ureters appear unremarkable.   Stomach/Bowel: No bowel obstruction or dilatation in the visualized abdomen.   Vascular/Lymphatic: Moderate aortoiliac atherosclerotic disease. The IVC is unremarkable. No portal venous gas. There is no adenopathy.   Other: None   Musculoskeletal: Bilateral L5 pars  defects with grade 1 anterolisthesis. No acute osseous pathology.   IMPRESSION: 1. A 1.7 cm stone in the left renal pelvis with mild left hydronephrosis. Additional 3 mm nonobstructing left renal inferior pole calculus. 2. Mild bilateral adrenal thickening/hyperplasia. A 1.7 cm left adrenal adenoma. 3. Probable small liver cysts. Further evaluation with ultrasound recommended. 4.  Aortic Atherosclerosis (ICD10-I70.0).   Electronically Signed   By: Vanetta Chou M.D.   On: 09/20/2023 17:02  Personally reviewed the above images and agree with radiologic interpretation. Stone to skin distance is 9.5 centimeters, and the Hounsfield units are 1975.   Assessment & Plan:    1. Left renal pelvic stone  - We discussed various treatment options for urolithiasis including observation with or without medical expulsive therapy, shockwave lithotripsy (SWL),  ureteroscopy and laser lithotripsy with stent placement, and percutaneous nephrolithotomy.  - We discussed that management is based on stone size, location, density, patient co-morbidities, and patient preference.   - Stones <71mm in size have a >80% spontaneous passage rate. Data surrounding the use of tamsulosin  for medical expulsive therapy is controversial, but meta analyses suggests it is most efficacious for distal stones between 5-36mm in size. Possible side effects include dizziness/lightheadedness, and retrograde ejaculation.  - SWL has a lower stone free rate in a single procedure, but also a lower complication rate compared to ureteroscopy and avoids a stent and associated stent related symptoms. Possible complications include renal hematoma, steinstrasse, and need for additional treatment. We discussed the role of his increased skin to stone distance can lead to decreased efficacy with shockwave lithotripsy.  - Ureteroscopy with laser lithotripsy and stent placement has a higher stone free rate than SWL in a single procedure, however increased complication rate including possible infection, ureteral injury, bleeding, and stent related morbidity. Common stent related symptoms include dysuria, urgency/frequency, and flank pain.  - Recommend surgical intervention for this stone give the size and obstructive nature. Given the density of the stone feel she would do better with ureteroscopy, likely in 2-3 weeks.   - Need to obtain medical clearance to hold Plavix  prior to the procedure. Pre-op urine culture sent today. Information regarding the procedure given to read at home.   2. Hydronephrosis  - Secondary to number 1.   Return in about 3 weeks (around 11/05/2023) for ureteroscopy.  I have reviewed the above documentation for accuracy and completeness, and I agree with the above.   Rosina Riis, MD    [facility] [address], [address] [place], [place]  [address] [phone]

## 2023-10-15 NOTE — H&P (View-Only) (Signed)
 I, Kim Love, acting as a scribe for Kim Riis, MD.,have documented all relevant documentation on the behalf of Kim Riis, MD,as directed by  Kim Riis, MD while in the presence of Kim Riis, MD.  10/15/2023 12:00 PM   Kim Love Mar 07, 1956 [id number]  Referring provider: Glendia Shad, MD [address] [place],  [place] [address]  Chief Complaint  Patient presents with   Establish Care   Hydronephrosis    HPI: 68 year-old female presents today further evaluation of a kidney stone.  She underwent a CT adrenal protocol for incidental bilateral thickening of her adrenal glands. On the study she was found to have a 1.7-centimeter left renal stone and left hydronephrosis with an additional 3-millimeter lower pole stone. Right side was unremarkable.   Labs were checked by her primary care and noted to be within normal limits.  Urinalysis today and results pending.  She is accompanied today with her husband.   She mentions in June of last year she fell when trying to sit down which injured her back and left flank which has been hurting since. The doctor thought she may have bruised or cracked a rib. The pain didn't subside and ultimately found she had a kidney stone. The pain radiates to her left lower quadrant as well.   She has a personal history of stroke and is on Plavix .  PMH: Past Medical History:  Diagnosis Date   Anxiety    Hypercholesterolemia    Hypertension    Tobacco abuse     Surgical History: Past Surgical History:  Procedure Laterality Date   CAROTID PTA/STENT INTERVENTION Right 06/12/2021   Procedure: CAROTID PTA/STENT INTERVENTION;  Surgeon: Marea Selinda RAMAN, MD;  Location: [facility];  Service: Cardiovascular;  Laterality: Right;   TONSILLECTOMY AND ADENOIDECTOMY Bilateral 1980    Home Medications:  Allergies as of 10/15/2023       Reactions   Povidone-iodine Swelling   Sulfamethoxazole-trimethoprim  Nausea And Vomiting   Atenolol Other (See Comments)   Bradycardia   Codeine    REACTION: gallbladder issues   Erythromycin    REACTION: nausea   Erythromycin    Upset stomach   Olmesartan    Other reaction(s): Other (See Comments) Increased BP.   Sulfa Antibiotics    Codeine    Gallbladder issues        Medication List        Accurate as of October 15, 2023 12:00 PM. If you have any questions, ask your nurse or doctor.          STOP taking these medications    amoxicillin -clavulanate 875-125 MG tablet Commonly known as: AUGMENTIN  Stopped by: Kim Love   tiZANidine  4 MG tablet Commonly known as: Zanaflex  Stopped by: Kim Love       TAKE these medications    amLODipine  5 MG tablet Commonly known as: NORVASC  Take 1 tablet (5 mg total) by mouth daily.   aspirin  EC 81 MG tablet Take 81 mg by mouth daily. Swallow whole.   cholecalciferol  25 MCG (1000 UNIT) tablet Commonly known as: VITAMIN D3 Take 1,000 Units by mouth daily.   citalopram  20 MG tablet Commonly known as: CELEXA  Take 1 tablet (20 mg total) by mouth daily.   clopidogrel  75 MG tablet Commonly known as: PLAVIX  TAKE 1 TABLET BY MOUTH EVERY DAY   cyanocobalamin  1000 MCG tablet Commonly known as: VITAMIN B12 Take 1,000 mcg by mouth daily.   fluticasone  50 MCG/ACT nasal  spray Commonly known as: FLONASE  Place 2 sprays into both nostrils daily. What changed:  when to take this reasons to take this   losartan -hydrochlorothiazide  100-25 MG tablet Commonly known as: HYZAAR Take 1 tablet by mouth daily.   potassium chloride  10 MEQ tablet Commonly known as: KLOR-CON  Take 1 tablet (10 mEq total) by mouth daily.   rosuvastatin  40 MG tablet Commonly known as: CRESTOR  Take 1 tablet (40 mg total) by mouth daily.        Allergies:  Allergies  Allergen Reactions   Povidone-Iodine Swelling   Sulfamethoxazole-Trimethoprim Nausea And Vomiting   Atenolol Other (See Comments)     Bradycardia   Codeine     REACTION: gallbladder issues   Erythromycin     REACTION: nausea   Erythromycin     Upset stomach   Olmesartan     Other reaction(s): Other (See Comments) Increased BP.   Sulfa Antibiotics    Codeine     Gallbladder issues    Family History: Family History  Problem Relation Age of Onset   Stroke Maternal Grandmother    Stroke Maternal Grandfather     Social History:  reports that she has been smoking cigarettes. She started smoking about 41 years ago. She has a 30 pack-year smoking history. She has never used smokeless tobacco. She reports that she does not drink alcohol and does not use drugs.   Physical Exam: BP 119/69   Pulse 70   Ht 5' 2 (1.575 m)   Wt 130 lb (59 kg)   BMI 23.78 kg/m   Constitutional:  Alert and oriented, No acute distress. HEENT: [place] AT, moist mucus membranes.  Trachea midline, no masses. Neurologic: Grossly intact, no focal deficits, moving all 4 extremities. Psychiatric: Normal mood and affect.   Pertinent Imaging: Narrative & Impression  CLINICAL DATA:  Bilateral adrenal gland thickening seen on recent lumbar spine MRI.   EXAM: CT ABDOMEN WITHOUT CONTRAST   TECHNIQUE: Multidetector CT imaging of the abdomen was performed following the standard protocol without IV contrast.   RADIATION DOSE REDUCTION: This exam was performed according to the departmental dose-optimization program which includes automated exposure control, adjustment of the mA and/or kV according to patient size and/or use of iterative reconstruction technique.   COMPARISON:  Lumbar spine MRI dated 07/28/2023.   FINDINGS: Evaluation of this exam is limited in the absence of intravenous contrast.   Lower chest: The visualized lung bases are clear.   Hepatobiliary: Small hypodense lesions in the left lobe of the liver measure up to 2.5 cm, suboptimally evaluated on this noncontrast CT, likely cysts. Similar findings were described on  ultrasound of 12/15/2009. The ultrasound images however are not available for direct comparison. Repeat ultrasound recommended for better evaluation of liver lesions. The gallbladder is unremarkable.   Pancreas: Unremarkable. No pancreatic ductal dilatation or surrounding inflammatory changes.   Spleen: Normal in size without focal abnormality.   Adrenals/Urinary Tract: Mild bilateral adrenal thickening/hyperplasia. A 1.7 cm left adrenal nodule demonstrates negative attenuation and Hounsfield unit consistent with an adenoma. A 1.7 cm stone in the left renal pelvis with mild left hydronephrosis. Additional 3 mm nonobstructing left renal inferior pole calculus. There is no hydronephrosis or nephrolithiasis on the right. The visualized ureters appear unremarkable.   Stomach/Bowel: No bowel obstruction or dilatation in the visualized abdomen.   Vascular/Lymphatic: Moderate aortoiliac atherosclerotic disease. The IVC is unremarkable. No portal venous gas. There is no adenopathy.   Other: None   Musculoskeletal: Bilateral L5 pars  defects with grade 1 anterolisthesis. No acute osseous pathology.   IMPRESSION: 1. A 1.7 cm stone in the left renal pelvis with mild left hydronephrosis. Additional 3 mm nonobstructing left renal inferior pole calculus. 2. Mild bilateral adrenal thickening/hyperplasia. A 1.7 cm left adrenal adenoma. 3. Probable small liver cysts. Further evaluation with ultrasound recommended. 4.  Aortic Atherosclerosis (ICD10-I70.0).   Electronically Signed   By: Vanetta Chou M.D.   On: 09/20/2023 17:02  Personally reviewed the above images and agree with radiologic interpretation. Stone to skin distance is 9.5 centimeters, and the Hounsfield units are 1975.   Assessment & Plan:    1. Left renal pelvic stone  - We discussed various treatment options for urolithiasis including observation with or without medical expulsive therapy, shockwave lithotripsy (SWL),  ureteroscopy and laser lithotripsy with stent placement, and percutaneous nephrolithotomy.  - We discussed that management is based on stone size, location, density, patient co-morbidities, and patient preference.   - Stones <71mm in size have a >80% spontaneous passage rate. Data surrounding the use of tamsulosin  for medical expulsive therapy is controversial, but meta analyses suggests it is most efficacious for distal stones between 5-36mm in size. Possible side effects include dizziness/lightheadedness, and retrograde ejaculation.  - SWL has a lower stone free rate in a single procedure, but also a lower complication rate compared to ureteroscopy and avoids a stent and associated stent related symptoms. Possible complications include renal hematoma, steinstrasse, and need for additional treatment. We discussed the role of his increased skin to stone distance can lead to decreased efficacy with shockwave lithotripsy.  - Ureteroscopy with laser lithotripsy and stent placement has a higher stone free rate than SWL in a single procedure, however increased complication rate including possible infection, ureteral injury, bleeding, and stent related morbidity. Common stent related symptoms include dysuria, urgency/frequency, and flank pain.  - Recommend surgical intervention for this stone give the size and obstructive nature. Given the density of the stone feel she would do better with ureteroscopy, likely in 2-3 weeks.   - Need to obtain medical clearance to hold Plavix  prior to the procedure. Pre-op urine culture sent today. Information regarding the procedure given to read at home.   2. Hydronephrosis  - Secondary to number 1.   Return in about 3 weeks (around 11/05/2023) for ureteroscopy.  I have reviewed the above documentation for accuracy and completeness, and I agree with the above.   Kim Riis, MD    [facility] [address], [address] [place], [place]  [address] [phone]

## 2023-10-15 NOTE — Patient Instructions (Signed)
Ureteroscopy  Ureteroscopy is a procedure to check for and treat problems inside part of the urinary tract. In this procedure, a long rigid or flexible tube with a lens and light at the end (ureteroscope) is used to look at the inside of the kidneys and the ureters. The ureters are the tubes that carry urine from the kidneys to the bladder. The ureteroscope is inserted into one or both of the ureters. You may need this procedure if you have frequent urinary tract infections (UTIs), blood in your urine, or a stone in one or both of your ureters. A ureteroscopy can be done: To find the cause of urine blockage in a ureter and to evaluate other abnormalities inside the ureters or kidneys. To remove stones. To remove or treat growths of tissue (polyps), abnormal tissue, and some types of tumors. To remove a tissue sample and check it for disease under a microscope (biopsy). Tell a health care provider about: Any allergies you have. All medicines you are taking, including vitamins, herbs, eye drops, creams, and over-the-counter medicines. Any problems you or family members have had with anesthetic medicines. Any bleeding problems you have. Any surgeries you have had. Any medical conditions you have. Whether you are pregnant or may be pregnant. What are the risks? Your health care provider will talk with you about risks. These may include: Abdominal pain or a burning feeling or pain while urinating. Abnormal bleeding. A UTI. Allergic reactions to medicines. Scarring that narrows the ureter (stricture) or swelling. Creating a hole (perforation) in the ureter. Damage to other structures or organs, such as the part of your body that drains urine from your bladder (urethra), your bladder, or your uterus. What happens before the procedure? When to stop eating and drinking  8 hours before your procedure Stop eating most foods. Do not eat meat, fried foods, or fatty foods. Eat only light foods, such  as toast or crackers. All liquids are okay except energy drinks and alcohol. 6 hours before your procedure Stop eating. Drink only clear liquids, such as water, clear fruit juice, black coffee, plain tea, and sports drinks. Do not drink energy drinks or alcohol. 2 hours before your procedure Stop drinking all liquids. You may be allowed to take medicines with small sips of water. Medicines Ask your health care provider about: Changing or stopping your regular medicines. These include any diabetes medicines or blood thinners you take. Taking medicines such as aspirin and ibuprofen. These medicines can thin your blood. Do not take these medicines unless your health care provider tells you to. Taking over-the-counter medicines, vitamins, herbs, and supplements. General instructions Do not use any products that contain nicotine or tobacco for at least 4 weeks before the procedure. These products include cigarettes, chewing tobacco, and vaping devices, such as e-cigarettes. If you need help quitting, ask your health care provider. If you will be going home right after the procedure, plan to have a responsible adult: Take you home from the hospital or clinic. You will not be allowed to drive. Care for you for the time you are told. Ask your health care provider what steps will be taken to help prevent infection. These may include: Washing skin with a soap that kills germs. Receiving antibiotic medicine. Tests You may have an exam or testing. You may have a urine sample taken to check for infection. What happens during the procedure? An IV will be inserted into one of your veins. You may be given: A sedative. This helps   you relax. Anesthesia. This will: Numb certain areas of your body. Make you fall asleep for surgery. Your urethra will be cleaned with a germ-killing solution. The ureteroscope will be passed through your urethra into your bladder. A salt-water solution will be sent through  the ureteroscope to fill your bladder. This will help the health care provider see the openings of your ureters more clearly. The ureteroscope will be passed into your ureter. If a growth is found, a biopsy may be done. If a stone is found, it may be removed through the ureteroscope, or the stone may be broken up using a laser, shock waves, or electrical energy. In some cases, if the ureter is too small, a tube may be inserted that keeps the ureter open (ureteral stent). The stent may be left in place for 1 or 2 weeks, and then the ureteroscopy procedure will be done again. The scope will be removed, and your bladder will be emptied. The procedure may vary among health care providers and hospitals. What happens after the procedure? Your blood pressure, heart rate, breathing rate, and blood oxygen level will be monitored until you leave the hospital or clinic. It is up to you to get the results of your procedure. Ask your health care provider, or the department that is doing the procedure, when your results will be ready. Summary Ureteroscopy is a procedure used to look at the inside of the kidneys and the ureters. You may need this procedure if you have frequent urinary tract infections (UTIs), blood in your urine, or a stone in one or both of your ureters. Follow instructions from your health care provider about eating and drinking. In some cases, if the ureter is too small, a tube may be inserted that keeps the ureter open (ureteral stent). The stent may be left in place for 1 or 2 weeks to keep the ureter open, and then the ureteroscopy procedure will be done again. This information is not intended to replace advice given to you by your health care provider. Make sure you discuss any questions you have with your health care provider. Document Revised: 07/30/2022 Document Reviewed: 07/30/2022 Elsevier Patient Education  2024 Elsevier Inc.  

## 2023-10-16 ENCOUNTER — Other Ambulatory Visit: Payer: Self-pay

## 2023-10-16 DIAGNOSIS — N200 Calculus of kidney: Secondary | ICD-10-CM

## 2023-10-16 NOTE — Progress Notes (Signed)
 Surgical Physician Order Form [facility]  Dr. Rosina Riis, MD  * Scheduling expectation : Next Available  *Length of Case:   *Clearance needed: yes; Dr. Marea hold plavix   *Anticoagulation Instructions: Hold plavix   *Aspirin  Instructions: Ok to continue Aspirin   *Post-op visit Date/Instructions:  1 week cysto/ stent removal  *Diagnosis: Left Nephrolithiasis  *Procedure: left Ureteroscopy w/laser lithotripsy & stent placement (47643)   Additional orders: N/A  -Admit type: OUTpatient  -Anesthesia: General  -VTE Prophylaxis Standing Order SCD's       Other:   -Standing Lab Orders Per Anesthesia    Lab other: None  -Standing Test orders EKG/Chest x-ray per Anesthesia       Test other:   - Medications:  Ancef  2gm IV  -Other orders:  N/A

## 2023-10-17 ENCOUNTER — Telehealth: Payer: Self-pay

## 2023-10-17 NOTE — Telephone Encounter (Signed)
  Per Dr. Penne, Patient is to be scheduled for Left Ureteroscopy with Laser Lithotripsy and Stent Placement    Kim Love was contacted and possible surgical dates were discussed, Monday February 17th, 2025 was agreed upon for surgery.  Patient was directed to call [phone] between 1-3pm the day before surgery to find out surgical arrival time.  Instructions were given not to eat or drink from midnight on the night before surgery and have a driver for the day of surgery. On the surgery day patient was instructed to enter through the Medical Mall entrance of [facility] report the Same Day Surgery desk.   Pre-Admit Testing will be in contact via phone to set up an interview with the anesthesia team to review your history and medications prior to surgery.   Reminder of this information was sent via MyChart to the patient.   Patient is to hold anticoag's per Dr. Penne.  Currently taking Plavix  by Dr. Marea. Clearance requested to stop 5 days prior to surgery, clearance form was faxed to [facility]. Awaiting response

## 2023-10-17 NOTE — Telephone Encounter (Signed)
 Surgical Clearance obtained from Dr. Vonna Guardian. Patient Advised to hold Plavix  for 5 days prior to surgery.

## 2023-10-17 NOTE — Progress Notes (Signed)
   [facility] Surgical Posting Form  Surgery Date: Date: 10/28/2023  Surgeon: Dr. Rosina Riis, MD  Inpt ( No  )   Outpt (Yes)   Obs ( No  )   Diagnosis: N20.0 Left Nephrolithiasis  -CPT: [phone]  Surgery: Left Ureteroscopy with Laser Lithotripsy and Stent Placement  Stop Anticoagulations: Yes  Cardiac/Medical/Pulmonary Clearance needed: Yes  Clearance needed from Dr: Marea  Clearance request sent on: Date: 10/17/23   *Orders entered into EPIC  Date: 10/17/23   *Case booked in EPIC  Date: 10/16/2023  *Notified pt of Surgery: Date: 10/16/2023  PRE-OP UA & CX: No  *Placed into Prior Authorization Work [place] Date: 10/17/23  Assistant/laser/rep:No

## 2023-10-17 NOTE — Telephone Encounter (Signed)
-----   Message from [place] sent at 10/17/2023 10:57 AM EST ----- OK to hold Plavix  for five days. Low risk. No further work up needed. ----- Message ----- From: Ruther Setter, CMA Sent: 10/17/2023   9:59 AM EST To: Selinda GORMAN Gu, MD  Surgical Clearance

## 2023-10-17 NOTE — Progress Notes (Signed)
  Phone Number: [phone] for Surgical Coordinator Fax Number: [phone]  REQUEST FOR SURGICAL CLEARANCE       Date: Date: 10/17/23  Faxed to: Dr. Marea  Surgeon: Dr. Rosina Riis, MD     Date of Surgery: 10/28/2023  Operation: Left Ureteroscopy with Laser Lithotripsy and Stent Placement    Anesthesia Type: General   Diagnosis: Left Nephrolithiasis  Patient Requires:   Cardiac / Vascular Clearance : Yes  Reason: Will need patient to hold Plavix  for 5 days   Risk Assessment:    Low   []       Moderate   []     High   []           This patient is optimized for surgery  YES []       NO   []    I recommend further assessment/workup prior to surgery. YES []      NO  []   Appointment scheduled for: _______________________   Further recommendations: ____________________________________     Physician Signature:__________________________________   Printed Name: ________________________________________   Date: _________________

## 2023-10-18 ENCOUNTER — Other Ambulatory Visit: Payer: Self-pay

## 2023-10-18 LAB — CULTURE, URINE COMPREHENSIVE

## 2023-10-18 MED ORDER — AMOXICILLIN-POT CLAVULANATE 875-125 MG PO TABS: 1.0000 | ORAL_TABLET | Freq: Two times a day (BID) | ORAL | 0 refills | Status: DC

## 2023-10-19 ENCOUNTER — Other Ambulatory Visit: Payer: Self-pay | Admitting: Internal Medicine

## 2023-10-19 DIAGNOSIS — K824 Cholesterolosis of gallbladder: Secondary | ICD-10-CM

## 2023-10-19 DIAGNOSIS — R935 Abnormal findings on diagnostic imaging of other abdominal regions, including retroperitoneum: Secondary | ICD-10-CM

## 2023-10-19 DIAGNOSIS — K7689 Other specified diseases of liver: Secondary | ICD-10-CM

## 2023-10-19 NOTE — Progress Notes (Signed)
 Order placed for referral to GI - Dr Lavaughn [place].

## 2023-10-21 DIAGNOSIS — D3502 Benign neoplasm of left adrenal gland: Secondary | ICD-10-CM | POA: Diagnosis not present

## 2023-10-22 ENCOUNTER — Encounter
Admission: RE | Admit: 2023-10-22 | Discharge: 2023-10-22 | Disposition: A | Discharge status: Home / Self Care | Payer: Medicare Other | Source: Ambulatory Visit | Attending: Urology | Admitting: Urology

## 2023-10-22 ENCOUNTER — Other Ambulatory Visit: Payer: Self-pay

## 2023-10-22 VITALS — Ht 62.0 in | Wt 133.0 lb

## 2023-10-22 DIAGNOSIS — I6523 Occlusion and stenosis of bilateral carotid arteries: Secondary | ICD-10-CM

## 2023-10-22 DIAGNOSIS — I10 Essential (primary) hypertension: Secondary | ICD-10-CM

## 2023-10-22 DIAGNOSIS — I63239 Cerebral infarction due to unspecified occlusion or stenosis of unspecified carotid arteries: Secondary | ICD-10-CM

## 2023-10-22 DIAGNOSIS — I700 Atherosclerosis of aorta: Secondary | ICD-10-CM

## 2023-10-22 DIAGNOSIS — E785 Hyperlipidemia, unspecified: Secondary | ICD-10-CM

## 2023-10-22 HISTORY — DX: Personal history of urinary calculi: Z87.442

## 2023-10-22 NOTE — Patient Instructions (Addendum)
Your procedure is scheduled on: Monday 10/28/23 To find out your arrival time, please call [phone] between 1PM - 3PM on:   Friday 10/25/23 Report to the Registration Desk on the 1st floor of the Medical Mall. FREE Valet parking is available.  If your arrival time is 6:00 am, do not arrive before that time as the Medical Mall entrance doors do not open until 6:00 am.  REMEMBER: Instructions that are not followed completely may result in serious medical risk, up to and including death; or upon the discretion of your surgeon and anesthesiologist your surgery may need to be rescheduled.  Do not eat food or drink any liquids after midnight the night before surgery.  No gum chewing or hard candies.  One week prior to surgery: Stop Anti-inflammatories (NSAIDS) such as Advil, Aleve, Ibuprofen, Motrin, Naproxen, Naprosyn and Aspirin based products such as Excedrin, Goody's Powder, BC Powder. You may however, continue to take Tylenol if needed for pain up until the day of surgery.  Stop ANY OVER THE COUNTER supplements and vitamins until after surgery.  Continue taking all prescribed medications with the exception of the following: Plavix hold for 5 days with last dose scheduled (10/21/23 by pt)  TAKE ONLY THESE MEDICATIONS THE MORNING OF SURGERY WITH A SIP OF WATER:  citalopram (CELEXA) 20 MG tablet   No Alcohol for 24 hours before or after surgery.  No Smoking including e-cigarettes for 24 hours before surgery.  No chewable tobacco products for at least 6 hours before surgery.  No nicotine patches on the day of surgery.  Do not use any "recreational" drugs for at least a week (preferably 2 weeks) before your surgery.  Please be advised that the combination of cocaine and anesthesia may have negative outcomes, up to and including death. If you test positive for cocaine, your surgery will be cancelled.  On the morning of surgery brush your teeth with toothpaste and water, you may rinse  your mouth with mouthwash if you wish. Do not swallow any toothpaste or mouthwash.  Do not wear lotions, powders, or perfumes.   Do not shave body hair from the neck down 48 hours before surgery.  Wear comfortable clothing (specific to your surgery type) to the hospital.  Do not wear jewelry, make-up, hairpins, clips or nail polish.  For welded (permanent) jewelry: bracelets, anklets, waist bands, etc.  Please have this removed prior to surgery.  If it is not removed, there is a chance that hospital personnel will need to cut it off on the day of surgery. Contact lenses, hearing aids and dentures may not be worn into surgery.  Do not bring valuables to the hospital. [facility] is not responsible for any missing/lost belongings or valuables.   Notify your doctor if there is any change in your medical condition (cold, fever, infection).  If you are being discharged the day of surgery, you will not be allowed to drive home. You will need a responsible individual to drive you home and stay with you for 24 hours after surgery.   If you are taking public transportation, you will need to have a responsible individual with you.  If you are being admitted to the hospital overnight, leave your suitcase in the car. After surgery it may be brought to your room.  In case of increased patient census, it may be necessary for you, the patient, to continue your postoperative care in the Same Day Surgery department.  After surgery, you can help prevent lung  complications by doing breathing exercises.  Take deep breaths and cough every 1-2 hours. Your doctor may order a device called an Incentive Spirometer to help you take deep breaths. When coughing or sneezing, hold a pillow firmly against your incision with both hands. This is called "splinting." Doing this helps protect your incision. It also decreases belly discomfort.  Surgery Visitation Policy:  Patients undergoing a surgery or procedure may  have two family members or support persons with them as long as the person is not COVID-19 positive or experiencing its symptoms.   Inpatient Visitation:    Visiting hours are 7 a.m. to 8 p.m. Up to four visitors are allowed at one time in a patient room. The visitors may rotate out with other people during the day. One designated support person (adult) may remain overnight.  Due to an increase in RSV and influenza rates and associated hospitalizations, children ages 56 and under will not be able to visit patients in [facility]. Masks continue to be strongly recommended.  Please call the Pre-admissions Testing Dept. at [phone] if you have any questions about these instructions.

## 2023-10-23 ENCOUNTER — Encounter
Admission: RE | Admit: 2023-10-23 | Discharge: 2023-10-23 | Disposition: A | Discharge status: Home / Self Care | Payer: Medicare Other | Source: Ambulatory Visit | Attending: Urology | Admitting: Urology

## 2023-10-23 DIAGNOSIS — I63239 Cerebral infarction due to unspecified occlusion or stenosis of unspecified carotid arteries: Secondary | ICD-10-CM | POA: Diagnosis not present

## 2023-10-23 DIAGNOSIS — I10 Essential (primary) hypertension: Secondary | ICD-10-CM | POA: Diagnosis not present

## 2023-10-23 DIAGNOSIS — I700 Atherosclerosis of aorta: Secondary | ICD-10-CM | POA: Diagnosis not present

## 2023-10-23 DIAGNOSIS — Z01810 Encounter for preprocedural cardiovascular examination: Secondary | ICD-10-CM | POA: Diagnosis not present

## 2023-10-23 DIAGNOSIS — E785 Hyperlipidemia, unspecified: Secondary | ICD-10-CM | POA: Insufficient documentation

## 2023-10-23 DIAGNOSIS — I6523 Occlusion and stenosis of bilateral carotid arteries: Secondary | ICD-10-CM

## 2023-10-28 ENCOUNTER — Encounter
Admission: RE | Disposition: A | Discharge status: Home / Self Care | Payer: Self-pay | Source: Home / Self Care | Attending: Urology

## 2023-10-28 ENCOUNTER — Encounter: Payer: Self-pay | Admitting: Urology

## 2023-10-28 ENCOUNTER — Ambulatory Visit
Admission: RE | Admit: 2023-10-28 | Discharge: 2023-10-28 | Disposition: A | Discharge status: Home / Self Care | Payer: Medicare Other | Attending: Urology | Admitting: Urology

## 2023-10-28 ENCOUNTER — Ambulatory Visit: Payer: Medicare Other | Admitting: Anesthesiology

## 2023-10-28 ENCOUNTER — Ambulatory Visit: Payer: Medicare Other

## 2023-10-28 ENCOUNTER — Other Ambulatory Visit: Payer: Self-pay

## 2023-10-28 DIAGNOSIS — Z8673 Personal history of transient ischemic attack (TIA), and cerebral infarction without residual deficits: Secondary | ICD-10-CM | POA: Diagnosis not present

## 2023-10-28 DIAGNOSIS — Z7902 Long term (current) use of antithrombotics/antiplatelets: Secondary | ICD-10-CM | POA: Insufficient documentation

## 2023-10-28 DIAGNOSIS — F17210 Nicotine dependence, cigarettes, uncomplicated: Secondary | ICD-10-CM | POA: Diagnosis not present

## 2023-10-28 DIAGNOSIS — N200 Calculus of kidney: Secondary | ICD-10-CM

## 2023-10-28 DIAGNOSIS — F419 Anxiety disorder, unspecified: Secondary | ICD-10-CM | POA: Diagnosis not present

## 2023-10-28 DIAGNOSIS — Z87891 Personal history of nicotine dependence: Secondary | ICD-10-CM | POA: Diagnosis not present

## 2023-10-28 DIAGNOSIS — I2510 Atherosclerotic heart disease of native coronary artery without angina pectoris: Secondary | ICD-10-CM | POA: Diagnosis not present

## 2023-10-28 DIAGNOSIS — I739 Peripheral vascular disease, unspecified: Secondary | ICD-10-CM | POA: Diagnosis not present

## 2023-10-28 DIAGNOSIS — N132 Hydronephrosis with renal and ureteral calculous obstruction: Secondary | ICD-10-CM | POA: Diagnosis not present

## 2023-10-28 DIAGNOSIS — I10 Essential (primary) hypertension: Secondary | ICD-10-CM | POA: Diagnosis not present

## 2023-10-28 DIAGNOSIS — Z79899 Other long term (current) drug therapy: Secondary | ICD-10-CM | POA: Diagnosis not present

## 2023-10-28 DIAGNOSIS — F32A Depression, unspecified: Secondary | ICD-10-CM | POA: Insufficient documentation

## 2023-10-28 HISTORY — PX: CYSTOSCOPY/URETEROSCOPY/HOLMIUM LASER/STENT PLACEMENT: SHX6546

## 2023-10-28 SURGERY — CYSTOSCOPY/URETEROSCOPY/HOLMIUM LASER/STENT PLACEMENT
Anesthesia: General | Site: Ureter | Laterality: Left

## 2023-10-28 MED ORDER — OXYCODONE HCL 5 MG PO TABS
5.0000 mg | ORAL_TABLET | Freq: Once | ORAL | Status: AC | PRN
  Administered 2023-10-28: 5 mg via ORAL

## 2023-10-28 MED ORDER — LACTATED RINGERS IV SOLN: INTRAVENOUS | Status: DC

## 2023-10-28 MED ORDER — FENTANYL CITRATE (PF) 100 MCG/2ML IJ SOLN
INTRAMUSCULAR | Status: DC | PRN
Start: 2023-10-28 — End: 2023-10-28
  Administered 2023-10-28 (×2): 50 ug via INTRAVENOUS

## 2023-10-28 MED ORDER — ONDANSETRON HCL 4 MG/2ML IJ SOLN
INTRAMUSCULAR | Status: DC | PRN
  Administered 2023-10-28: 4 mg via INTRAVENOUS

## 2023-10-28 MED ORDER — DEXAMETHASONE SODIUM PHOSPHATE 10 MG/ML IJ SOLN
INTRAMUSCULAR | Status: DC | PRN
  Administered 2023-10-28: 5 mg via INTRAVENOUS

## 2023-10-28 MED ORDER — CHLORHEXIDINE GLUCONATE 0.12 % MT SOLN
15.0000 mL | Freq: Once | OROMUCOSAL | Status: AC
  Administered 2023-10-28: 15 mL via OROMUCOSAL

## 2023-10-28 MED ORDER — ONDANSETRON HCL 4 MG/2ML IJ SOLN
INTRAMUSCULAR | Status: AC
Start: 2023-10-28 — End: ?
  Filled 2023-10-28: qty 2

## 2023-10-28 MED ORDER — ACETAMINOPHEN 10 MG/ML IV SOLN
INTRAVENOUS | Status: AC
  Filled 2023-10-28: qty 100

## 2023-10-28 MED ORDER — ONDANSETRON HCL 4 MG/2ML IJ SOLN: 4.0000 mg | Freq: Once | INTRAMUSCULAR | Status: DC | PRN

## 2023-10-28 MED ORDER — IOHEXOL 180 MG/ML  SOLN
INTRAMUSCULAR | Status: DC | PRN
  Administered 2023-10-28: 10 mL

## 2023-10-28 MED ORDER — MIDAZOLAM HCL 2 MG/2ML IJ SOLN
INTRAMUSCULAR | Status: DC | PRN
Start: 2023-10-28 — End: 2023-10-28
  Administered 2023-10-28: 2 mg via INTRAVENOUS

## 2023-10-28 MED ORDER — SODIUM CHLORIDE 0.9 % IR SOLN
Status: DC | PRN
  Administered 2023-10-28: 3000 mL

## 2023-10-28 MED ORDER — PROPOFOL 10 MG/ML IV BOLUS
INTRAVENOUS | Status: AC
  Filled 2023-10-28: qty 20

## 2023-10-28 MED ORDER — LIDOCAINE HCL (PF) 2 % IJ SOLN
INTRAMUSCULAR | Status: AC
  Filled 2023-10-28: qty 5

## 2023-10-28 MED ORDER — FENTANYL CITRATE (PF) 100 MCG/2ML IJ SOLN: 25.0000 ug | INTRAMUSCULAR | Status: DC | PRN

## 2023-10-28 MED ORDER — PROPOFOL 10 MG/ML IV BOLUS
INTRAVENOUS | Status: DC | PRN
  Administered 2023-10-28: 120 mg via INTRAVENOUS

## 2023-10-28 MED ORDER — ROCURONIUM BROMIDE 100 MG/10ML IV SOLN
INTRAVENOUS | Status: DC | PRN
  Administered 2023-10-28: 40 mg via INTRAVENOUS

## 2023-10-28 MED ORDER — MIDAZOLAM HCL 2 MG/2ML IJ SOLN
INTRAMUSCULAR | Status: AC
  Filled 2023-10-28: qty 2

## 2023-10-28 MED ORDER — ORAL CARE MOUTH RINSE: 15.0000 mL | Freq: Once | OROMUCOSAL | Status: AC

## 2023-10-28 MED ORDER — CEFAZOLIN SODIUM-DEXTROSE 2-4 GM/100ML-% IV SOLN
2.0000 g | INTRAVENOUS | Status: AC
  Administered 2023-10-28: 2 g via INTRAVENOUS

## 2023-10-28 MED ORDER — TAMSULOSIN HCL 0.4 MG PO CAPS: 0.4000 mg | ORAL_CAPSULE | Freq: Every day | ORAL | 0 refills | Status: DC

## 2023-10-28 MED ORDER — CHLORHEXIDINE GLUCONATE 0.12 % MT SOLN
OROMUCOSAL | Status: AC
  Filled 2023-10-28: qty 15

## 2023-10-28 MED ORDER — ROCURONIUM BROMIDE 10 MG/ML (PF) SYRINGE
PREFILLED_SYRINGE | INTRAVENOUS | Status: AC
  Filled 2023-10-28: qty 10

## 2023-10-28 MED ORDER — SUGAMMADEX SODIUM 200 MG/2ML IV SOLN
INTRAVENOUS | Status: DC | PRN
  Administered 2023-10-28: 200 mg via INTRAVENOUS

## 2023-10-28 MED ORDER — HYDROCODONE-ACETAMINOPHEN 5-325 MG PO TABS: 1.0000 | ORAL_TABLET | Freq: Four times a day (QID) | ORAL | 0 refills | Status: DC | PRN

## 2023-10-28 MED ORDER — LIDOCAINE HCL (CARDIAC) PF 100 MG/5ML IV SOSY
PREFILLED_SYRINGE | INTRAVENOUS | Status: DC | PRN
  Administered 2023-10-28: 80 mg via INTRAVENOUS

## 2023-10-28 MED ORDER — OXYBUTYNIN CHLORIDE 5 MG PO TABS: 5.0000 mg | ORAL_TABLET | Freq: Three times a day (TID) | ORAL | 0 refills | Status: DC | PRN

## 2023-10-28 MED ORDER — DEXAMETHASONE SODIUM PHOSPHATE 10 MG/ML IJ SOLN
INTRAMUSCULAR | Status: AC
  Filled 2023-10-28: qty 1

## 2023-10-28 MED ORDER — CEFAZOLIN SODIUM-DEXTROSE 2-4 GM/100ML-% IV SOLN
INTRAVENOUS | Status: AC
  Filled 2023-10-28: qty 100

## 2023-10-28 MED ORDER — DEXAMETHASONE SODIUM PHOSPHATE 10 MG/ML IJ SOLN
INTRAMUSCULAR | Status: AC
Start: 2023-10-28 — End: ?
  Filled 2023-10-28: qty 1

## 2023-10-28 MED ORDER — FENTANYL CITRATE (PF) 100 MCG/2ML IJ SOLN
INTRAMUSCULAR | Status: AC
Start: 2023-10-28 — End: ?
  Filled 2023-10-28: qty 2

## 2023-10-28 MED ORDER — ACETAMINOPHEN 10 MG/ML IV SOLN: 1000.0000 mg | Freq: Once | INTRAVENOUS | Status: DC | PRN

## 2023-10-28 MED ORDER — ACETAMINOPHEN 10 MG/ML IV SOLN
INTRAVENOUS | Status: DC | PRN
Start: 2023-10-28 — End: 2023-10-28
  Administered 2023-10-28: 1000 mg via INTRAVENOUS

## 2023-10-28 MED ORDER — OXYCODONE HCL 5 MG/5ML PO SOLN: 5.0000 mg | Freq: Once | ORAL | Status: AC | PRN

## 2023-10-28 MED ORDER — EPHEDRINE SULFATE-NACL 50-0.9 MG/10ML-% IV SOSY
PREFILLED_SYRINGE | INTRAVENOUS | Status: DC | PRN
  Administered 2023-10-28 (×3): 5 mg via INTRAVENOUS

## 2023-10-28 MED ORDER — OXYCODONE HCL 5 MG PO TABS
ORAL_TABLET | ORAL | Status: AC
  Filled 2023-10-28: qty 1

## 2023-10-28 SURGICAL SUPPLY — 24 items
ADHESIVE MASTISOL STRL (MISCELLANEOUS) IMPLANT
BAG DRAIN SIEMENS DORNER NS (MISCELLANEOUS) ×1 IMPLANT
BASKET ZERO TIP 1.9FR (BASKET) IMPLANT
BRUSH SCRUB EZ 4% CHG (MISCELLANEOUS) IMPLANT
CATH URET FLEX-TIP 2 LUMEN 10F (CATHETERS) IMPLANT
CATH URETL OPEN 5X70 (CATHETERS) ×1 IMPLANT
CNTNR URN SCR LID CUP LEK RST (MISCELLANEOUS) IMPLANT
DRAPE UTILITY 15X26 TOWEL STRL (DRAPES) IMPLANT
DRSG TEGADERM 2-3/8X2-3/4 SM (GAUZE/BANDAGES/DRESSINGS) IMPLANT
FIBER LASER MOSES 200 DFL (Laser) IMPLANT
FIBER LASER MOSES 365 DFL (Laser) IMPLANT
GLOVE BIO SURGEON STRL SZ 6.5 (GLOVE) ×1 IMPLANT
GOWN STRL REUS W/ TWL LRG LVL3 (GOWN DISPOSABLE) ×2 IMPLANT
GUIDEWIRE GREEN .038 145CM (MISCELLANEOUS) IMPLANT
GUIDEWIRE STR DUAL SENSOR (WIRE) ×1 IMPLANT
IV NS IRRIG 3000ML ARTHROMATIC (IV SOLUTION) ×1 IMPLANT
KIT TURNOVER CYSTO (KITS) ×1 IMPLANT
PACK CYSTO AR (MISCELLANEOUS) ×1 IMPLANT
SET CYSTO W/LG BORE CLAMP LF (SET/KITS/TRAYS/PACK) ×1 IMPLANT
SHEATH NAVIGATOR HD 12/14X36 (SHEATH) IMPLANT
STENT URET 6FRX24 CONTOUR (STENTS) IMPLANT
STENT URET 6FRX26 CONTOUR (STENTS) IMPLANT
SURGILUBE 2OZ TUBE FLIPTOP (MISCELLANEOUS) ×1 IMPLANT
WATER STERILE IRR 500ML POUR (IV SOLUTION) ×1 IMPLANT

## 2023-10-28 NOTE — Op Note (Signed)
Date of procedure: 10/28/23  Preoperative diagnosis:  Left renal pelvic stone  Postoperative diagnosis:  Same as above  Procedure: Left ureteroscopy Laser lithotripsy Left ureteral stent placement Retrograde pyelogram Interpretation of fluoroscopy less than 30 minutes  Surgeon: Vanna Scotland, MD  Anesthesia: General  Complications: None  Intraoperative findings: Large 1.7 cm left renal pelvic stone, obliterated.  Stent on tether.  Excellent dusting.  EBL: Minimal  Specimens: None  Drains: 6 x 24 French double-J ureteral stent on left with tether  Indication: Kim Love is a 68 y.o. patient with incidental large left renal pelvic stone with hydronephrosis.  After reviewing the management options for treatment,   elected to proceed with the above surgical procedure(s). We have discussed the potential benefits and risks of the procedure, side effects of the proposed treatment, the likelihood of the patient achieving the goals of the procedure, and any potential problems that might occur during the procedure or recuperation. Informed consent has been obtained.  Description of procedure:  The patient was taken to the operating room and general anesthesia was induced.  The patient was placed in the dorsal lithotomy position, prepped and draped in the usual sterile fashion, and preoperative antibiotics were administered. A preoperative time-out was performed.    A 21 French scope was advanced per urethra into the bladder.  Attention was turned to the left ureteral orifice. This was then cannulated using a sensor wire up to the level of the kidney.  A dual-lumen ureteral access sheath was used just within the distal ureter.  A Super Stiff wire was then introduced all the way up to the level of the kidney through the second lumen of the catheter.  The sensor wire was snapped in place as a safety wire.  A ureteral access sheath, 12/14 [place] was advanced  to the proximal ureter.  A  dual-lumen digital ureteroscope was then advanced up to the level of the stone.  A 242 m laser fiber was then brought in using dusting settings of 0.3 J and 60 Hz, the stone was dusted.  I ended up increasing the hertz 200 and the stone dusted beautifully.  . The scope was then advanced into the each of the calyces and additional stone burden was identified and dusted.  At the end of the procedure, no significant residual stone fragment remained greater than the size of the tip of the laser fiber.  A final retrograde pyelogram created roadmap to ensure that each every calyx was visualized and all significant stone burden was addressed.  There was no contrast extravasation.  The scope was then backed down the length of the ureter inspecting the ureteral integrity along the way.  There was no residual stone burden or significant ureteral injuries appreciated.  Finally, a 6 by 24 French ureteral access sheath was advanced over the safety wire up to the level of the kidney.  Upon wire removal, there was a full coil noted both within the renal pelvis as well as within the bladder.  The stent string was left at fixed to the distal coil of the stent and secured to the patient's using Mastisol and Tegaderm.  The patient was then cleaned and dried, repositioned in supine position, reversed of anesthesia, taken to the PACU in stable condition.  Plan: Stent may be self removed in 1 week.  She will follow-up in 6 weeks with renal ultrasound prior.  Vanna Scotland, M.D.

## 2023-10-28 NOTE — Anesthesia Postprocedure Evaluation (Signed)
Anesthesia Post Note  Patient: Kim Love  Procedure(s) Performed: CYSTOSCOPY/URETEROSCOPY/HOLMIUM LASER/STENT PLACEMENT (Left: Ureter)  Patient location during evaluation: PACU Anesthesia Type: General Level of consciousness: awake and alert Pain management: pain level controlled Vital Signs Assessment: post-procedure vital signs reviewed and stable Respiratory status: spontaneous breathing, nonlabored ventilation, respiratory function stable and patient connected to nasal cannula oxygen Cardiovascular status: blood pressure returned to baseline and stable Postop Assessment: no apparent nausea or vomiting Anesthetic complications: no   No notable events documented.   Last Vitals:  Vitals:   10/28/23 1138 10/28/23 1145  BP:  135/72  Pulse: 79 82  Resp: 14 17  Temp:  (!) 36.3 C  SpO2: 96% 99%    Last Pain:  Vitals:   10/28/23 1145  TempSrc:   PainSc: 4                  Corinda Gubler

## 2023-10-28 NOTE — Interval H&P Note (Signed)
History and Physical Interval Note:  10/28/2023 9:46 AM  Kim Love  has presented today for surgery, with the diagnosis of Left Nephrolithiasis.  The various methods of treatment have been discussed with the patient and family. After consideration of risks, benefits and other options for treatment, the patient has consented to  Procedure(s): CYSTOSCOPY/URETEROSCOPY/HOLMIUM LASER/STENT PLACEMENT (Left) as a surgical intervention.  The patient's history has been reviewed, patient examined, no change in status, stable for surgery.  I have reviewed the patient's chart and labs.  Questions were answered to the patient's satisfaction.    RRR CTAB  Vanna Scotland

## 2023-10-28 NOTE — Anesthesia Preprocedure Evaluation (Signed)
Anesthesia Evaluation  Patient identified by MRN, date of birth, ID band Patient awake    Reviewed: Allergy & Precautions, NPO status , Patient's Chart, lab work & pertinent test results  History of Anesthesia Complications Negative for: history of anesthetic complications  Airway Mallampati: II  TM Distance: >3 FB Neck ROM: Full    Dental no notable dental hx. (+) Teeth Intact   Pulmonary neg pulmonary ROS, neg sleep apnea, neg COPD, Current Smoker and Patient abstained from smoking.   Pulmonary exam normal breath sounds clear to auscultation       Cardiovascular Exercise Tolerance: Good METShypertension, Pt. on medications + Peripheral Vascular Disease  (-) CAD and (-) Past MI (-) dysrhythmias  Rhythm:Regular Rate:Normal - Systolic murmurs    Neuro/Psych  PSYCHIATRIC DISORDERS Anxiety Depression    CVA, No Residual Symptoms    GI/Hepatic ,neg GERD  ,,(+)     (-) substance abuse    Endo/Other  neg diabetes    Renal/GU negative Renal ROS     Musculoskeletal   Abdominal   Peds  Hematology   Anesthesia Other Findings Past Medical History: No date: Anxiety No date: History of kidney stones No date: Hypercholesterolemia No date: Hypertension 2022: Stroke (HCC) No date: Tobacco abuse  Reproductive/Obstetrics                             Anesthesia Physical Anesthesia Plan  ASA: 3  Anesthesia Plan: General   Post-op Pain Management: Ofirmev IV (intra-op)*   Induction: Intravenous  PONV Risk Score and Plan: 3 and Ondansetron, Dexamethasone and Midazolam  Airway Management Planned: Oral ETT and Video Laryngoscope Planned  Additional Equipment: None  Intra-op Plan:   Post-operative Plan: Extubation in OR  Informed Consent: I have reviewed the patients History and Physical, chart, labs and discussed the procedure including the risks, benefits and alternatives for the proposed  anesthesia with the patient or authorized representative who has indicated his/her understanding and acceptance.     Dental advisory given  Plan Discussed with: CRNA and Surgeon  Anesthesia Plan Comments: (Discussed risks of anesthesia with patient, including PONV, sore throat, lip/dental/eye damage. Rare risks discussed as well, such as cardiorespiratory and neurological sequelae, and allergic reactions. Discussed the role of CRNA in patient's perioperative care. Patient understands. Patient counseled on benefits of smoking cessation, and increased perioperative risks associated with continued smoking. )       Anesthesia Quick Evaluation

## 2023-10-28 NOTE — Transfer of Care (Signed)
Immediate Anesthesia Transfer of Care Note  Patient: Kim Love  Procedure(s) Performed: CYSTOSCOPY/URETEROSCOPY/HOLMIUM LASER/STENT PLACEMENT (Left: Ureter)  Patient Location: PACU  Anesthesia Type:General  Level of Consciousness: awake  Airway & Oxygen Therapy: Patient Spontanous Breathing and Patient connected to face mask oxygen  Post-op Assessment: Report given to RN and Post -op Vital signs reviewed and stable  Post vital signs: Reviewed and stable  Last Vitals:  Vitals Value Taken Time  BP 138/70 10/28/23 1108  Temp    Pulse 83 10/28/23 1110  Resp 22 10/28/23 1110  SpO2 100 % 10/28/23 1110  Vitals shown include unfiled device data.  Last Pain:  Vitals:   10/28/23 0830  TempSrc: Temporal  PainSc: 0-No pain         Complications: No notable events documented.

## 2023-10-28 NOTE — Discharge Instructions (Addendum)
You have a ureteral stent in place.  This is a tube that extends from your kidney to your bladder.  This may cause urinary bleeding, burning with urination, and urinary frequency.  Please call our office or present to the ED if you develop fevers >101 or pain which is not able to be controlled with oral pain medications.  You may be given either Flomax and/ or ditropan to help with bladder spasms and stent pain in addition to pain medications.    Your stent should be removed in 1 week.  It is taped to a string with a sticker on your left inner thigh.  On that day, tape and pull gently until the entire stent is removed.    [facility] [address], [address] [place], [place] [address] [phone]

## 2023-10-28 NOTE — Anesthesia Procedure Notes (Signed)
Procedure Name: Intubation Date/Time: 10/28/2023 10:15 AM  Performed by: Elisabeth Pigeon, CRNAPre-anesthesia Checklist: Patient identified, Patient being monitored, Timeout performed, Emergency Drugs available and Suction available Patient Re-evaluated:Patient Re-evaluated prior to induction Oxygen Delivery Method: Circle system utilized Preoxygenation: Pre-oxygenation with 100% oxygen Induction Type: IV induction Ventilation: Mask ventilation without difficulty Laryngoscope Size: Mac, 3 and McGrath Grade View: Grade I Tube type: Oral Tube size: 6.5 mm Number of attempts: 1 Airway Equipment and Method: Stylet Placement Confirmation: ETT inserted through vocal cords under direct vision, positive ETCO2 and breath sounds checked- equal and bilateral Secured at: 22 cm Tube secured with: Tape Dental Injury: Teeth and Oropharynx as per pre-operative assessment

## 2023-10-29 ENCOUNTER — Other Ambulatory Visit: Payer: Medicare Other

## 2023-10-29 ENCOUNTER — Telehealth: Payer: Self-pay | Admitting: Internal Medicine

## 2023-10-29 ENCOUNTER — Encounter: Payer: Self-pay | Admitting: Urology

## 2023-10-29 NOTE — Telephone Encounter (Signed)
Copied from CRM [id number]. Topic: Appointments - Appointment Scheduling >> Oct 28, 2023  4:43 PM Tiffany H wrote: Patient called to reschedule 10/30/23 lab and 10/24/23 appointment with Dr. Lorin Picket. Patient had a Ureterscopy and Holmium Laster/Stent Placement today and will not be well enough to labs drawn as intended. Patient doesn't feel well enough to do labs tomorrow and would like to know when Dr. Lorin Picket would like to see her in lieu of recent procedure. Please advise.

## 2023-10-29 NOTE — Telephone Encounter (Signed)
Pt rescheduled for labs and physical next week. Has f/u with vascular and urology next week as well.

## 2023-10-29 NOTE — Telephone Encounter (Signed)
Confirmed patient doing ok.

## 2023-10-30 ENCOUNTER — Other Ambulatory Visit: Payer: Self-pay

## 2023-10-30 DIAGNOSIS — N200 Calculus of kidney: Secondary | ICD-10-CM

## 2023-10-31 ENCOUNTER — Encounter: Payer: Medicare Other | Admitting: Internal Medicine

## 2023-11-01 ENCOUNTER — Other Ambulatory Visit (INDEPENDENT_AMBULATORY_CARE_PROVIDER_SITE_OTHER): Payer: Self-pay | Admitting: Vascular Surgery

## 2023-11-01 DIAGNOSIS — I63239 Cerebral infarction due to unspecified occlusion or stenosis of unspecified carotid arteries: Secondary | ICD-10-CM

## 2023-11-05 ENCOUNTER — Ambulatory Visit (INDEPENDENT_AMBULATORY_CARE_PROVIDER_SITE_OTHER): Payer: Medicare Other | Admitting: Vascular Surgery

## 2023-11-05 ENCOUNTER — Ambulatory Visit (INDEPENDENT_AMBULATORY_CARE_PROVIDER_SITE_OTHER): Payer: Medicare Other

## 2023-11-05 ENCOUNTER — Telehealth: Payer: Self-pay | Admitting: Internal Medicine

## 2023-11-05 ENCOUNTER — Encounter (INDEPENDENT_AMBULATORY_CARE_PROVIDER_SITE_OTHER): Payer: Self-pay | Admitting: Vascular Surgery

## 2023-11-05 VITALS — BP 115/70 | HR 90 | Resp 18 | Ht 62.0 in | Wt 133.4 lb

## 2023-11-05 DIAGNOSIS — E785 Hyperlipidemia, unspecified: Secondary | ICD-10-CM

## 2023-11-05 DIAGNOSIS — I10 Essential (primary) hypertension: Secondary | ICD-10-CM | POA: Diagnosis not present

## 2023-11-05 DIAGNOSIS — I63239 Cerebral infarction due to unspecified occlusion or stenosis of unspecified carotid arteries: Secondary | ICD-10-CM

## 2023-11-05 NOTE — Progress Notes (Signed)
 MRN : [id number]  Kim Love is a 68 y.o. (Jan 24, 1956) female who presents with chief complaint of  Chief Complaint  Patient presents with   Follow-up    f/u in 1 year with carotid -  .  History of Present Illness: patient returns in follow up of her carotid disease. She is doing well today and has no specific complaints.  She has had a tough year emotionally and sounds like they had a son-in-law who was murdered last year which is just awful.  She does not have any focal neurologic symptoms. Specifically, the patient denies amaurosis fugax, speech or swallowing difficulties, or arm or leg weakness or numbness.  Carotid duplex shows the right carotid stents to be widely patent without recurrent stenosis and the left carotid artery has near normal flow on duplex without any significant disease.  Current Outpatient Medications  Medication Sig Dispense Refill   amLODipine (NORVASC) 5 MG tablet Take 1 tablet (5 mg total) by mouth daily. (Patient taking differently: Take 5 mg by mouth at bedtime.) 90 tablet 3   amoxicillin-clavulanate (AUGMENTIN) 875-125 MG tablet Take 1 tablet by mouth 2 (two) times daily. 14 tablet 0   aspirin EC 81 MG tablet Take 81 mg by mouth at bedtime. Swallow whole.     cholecalciferol (VITAMIN D3) 25 MCG (1000 UNIT) tablet Take 5,000 Units by mouth every other day.     citalopram (CELEXA) 20 MG tablet Take 1 tablet (20 mg total) by mouth daily. 90 tablet 1   clopidogrel (PLAVIX) 75 MG tablet TAKE 1 TABLET BY MOUTH EVERY DAY 90 tablet 1   fluticasone (FLONASE) 50 MCG/ACT nasal spray Place 2 sprays into both nostrils daily. (Patient taking differently: Place 2 sprays into both nostrils as needed.) 16 g 6   HYDROcodone-acetaminophen (NORCO/VICODIN) 5-325 MG tablet Take 1-2 tablets by mouth every 6 (six) hours as needed for moderate pain (pain score 4-6). 10 tablet 0   losartan-hydrochlorothiazide (HYZAAR) 100-25 MG tablet Take 1 tablet by mouth daily. 90 tablet 3    oxybutynin (DITROPAN) 5 MG tablet Take 1 tablet (5 mg total) by mouth every 8 (eight) hours as needed for bladder spasms. 30 tablet 0   potassium chloride (KLOR-CON) 10 MEQ tablet Take 1 tablet (10 mEq total) by mouth daily. 90 tablet 3   rosuvastatin (CRESTOR) 40 MG tablet Take 1 tablet (40 mg total) by mouth daily. (Patient taking differently: Take 40 mg by mouth at bedtime.) 90 tablet 3   tamsulosin (FLOMAX) 0.4 MG CAPS capsule Take 1 capsule (0.4 mg total) by mouth daily. 30 capsule 0   vitamin B-12 (CYANOCOBALAMIN) 1000 MCG tablet Take 1,000 mcg by mouth daily.     No current facility-administered medications for this visit.    Past Medical History:  Diagnosis Date   Anxiety    History of kidney stones    Hypercholesterolemia    Hypertension    Stroke (HCC) 2022   Tobacco abuse     Past Surgical History:  Procedure Laterality Date   CAROTID PTA/STENT INTERVENTION Right 06/12/2021   Procedure: CAROTID PTA/STENT INTERVENTION;  Surgeon: Annice Needy, MD;  Location: [facility] INVASIVE CV LAB;  Service: Cardiovascular;  Laterality: Right;   CYSTOSCOPY/URETEROSCOPY/HOLMIUM LASER/STENT PLACEMENT Left 10/28/2023   Procedure: CYSTOSCOPY/URETEROSCOPY/HOLMIUM LASER/STENT PLACEMENT;  Surgeon: Vanna Scotland, MD;  Location: [facility] ORS;  Service: Urology;  Laterality: Left;   TONSILLECTOMY AND ADENOIDECTOMY Bilateral 1980     Social History   Tobacco Use   Smoking status:  Every Day    Current packs/day: 1.00    Average packs/day: 1 pack/day for 41.2 years (41.2 ttl pk-yrs)    Types: Cigarettes    Start date: 09/10/1982   Smokeless tobacco: Never  Vaping Use   Vaping status: Never Used  Substance Use Topics   Alcohol use: No    Comment: "once in a blue moon: I can't remember the last time."   Drug use: No       Family History  Problem Relation Age of Onset   Stroke Maternal Grandmother    Stroke Maternal Grandfather      Allergies  Allergen Reactions   Povidone-Iodine Swelling    Sulfamethoxazole-Trimethoprim Nausea And Vomiting   Atenolol Other (See Comments)    Bradycardia   Codeine     REACTION: "gallbladder issues"   Erythromycin     REACTION: nausea   Erythromycin     Upset stomach   Olmesartan     Other reaction(s): Other (See Comments) Increased BP.   Sulfa Antibiotics    Codeine     Gallbladder issues     REVIEW OF SYSTEMS (Negative unless checked)   Constitutional: [] Weight loss  [] Fever  [] Chills Cardiac: [] Chest pain   [] Chest pressure   [] Palpitations   [] Shortness of breath when laying flat   [] Shortness of breath at rest   [] Shortness of breath with exertion. 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   [] Dysphagia   [] Weakness or numbness in arms   [] Weakness or numbness in legs Musculoskeletal:  [] Arthritis   [] Joint swelling   [] Joint pain   [] Low back pain Hematologic:  [] Easy bruising  [] Easy bleeding   [] Hypercoagulable state   [] Anemic   Gastrointestinal:  [] Blood in stool   [] Vomiting blood  [] Gastroesophageal reflux/heartburn   [] Abdominal pain Genitourinary:  [] Chronic kidney disease   [] Difficult urination  [] Frequent urination  [] Burning with urination   [] Hematuria Skin:  [] Rashes   [] Ulcers   [] Wounds Psychological:  [x] History of anxiety   []  History of major depression.  Physical Examination  Vitals:   11/05/23 0833  BP: 115/70  Pulse: 90  Resp: 18  Weight: 133 lb 6.4 oz (60.5 kg)  Height: 5\' 2"  (1.575 m)   Body mass index is 24.4 kg/m. Gen:   WD/WN, NAD Head: [place]/AT, No temporalis wasting. Ear/Nose/Throat: Hearing grossly intact, nares w/o erythema or drainage, trachea midline Eyes: Conjunctiva clear. Sclera non-icteric Neck: Supple.  No bruit  Pulmonary:  Good air movement, equal and clear to auscultation bilaterally.  Cardiac: RRR, No JVD Vascular:  Vessel Right Left  Radial Palpable Palpable           Musculoskeletal: M/S 5/5 throughout.  No deformity or atrophy. No edema. Neurologic: CN 2-12 intact. Sensation grossly intact in extremities.  Symmetrical.  Speech is fluent. Motor exam as listed above. Psychiatric: Judgment intact, Mood & affect appropriate for pt's  clinical situation. Dermatologic: No rashes or ulcers noted.  No cellulitis or open wounds.    CBC Lab Results  Component Value Date   WBC 6.3 11/09/2022   HGB 14.3 11/09/2022   HCT 41.7 11/09/2022   MCV 90.6 11/09/2022   PLT 232.0 11/09/2022    BMET    Component Value Date/Time   NA 135 09/27/2023 1345   NA 139 07/30/2012 1247   K 4.0 10/14/2023 1036   K 3.2 (L) 07/30/2012 1247   CL 97 09/27/2023 1345   CL 105 07/30/2012 1247   CO2 32 09/27/2023 1345   CO2 26 07/30/2012 1247   GLUCOSE 85 09/27/2023 1345   GLUCOSE 106 (H) 07/30/2012 1247   BUN 11 09/27/2023 1345   BUN 14 07/30/2012 1247   CREATININE 0.79 09/27/2023 1345   CREATININE 0.72 07/30/2012 1247   CALCIUM 8.8 09/27/2023 1345   CALCIUM 8.9 07/30/2012 1247   GFRNONAA >60 06/13/2021 0605   GFRNONAA >60 07/30/2012 1247   GFRAA >60 07/30/2012 1247   CrCl cannot be calculated (Patient's most recent lab result is older than the maximum 21 days allowed.).  COAG Lab Results  Component Value Date   INR 1.0 06/01/2021   INR 1.0 05/28/2021    Radiology DG OR UROLOGY CYSTO IMAGE [facility] ONLY) Result Date: 10/28/2023 There is no interpretation for this exam.  This order is for images obtained during a surgical procedure.  Please See "Surgeries" Tab for more information regarding the  procedure.   US Abdomen Limited RUQ (LIVER/GB) Result Date: 10/15/2023 : PROCEDURE: ULTRASOUND ABDOMEN LIMITED HISTORY: Patient is a 68 y/o  F with liver cysts. COMPARISON: CT abdomen 09/10/2023. TECHNIQUE: Two-dimensional grayscale and color Doppler ultrasound of the limited abdomen was performed. FINDINGS: The liver demonstrates a normal echotexture without intrahepatic biliary dilatation. No solid masses are visualized. There are multiple complex cysts with the largest measuring 2.6 x 2.1 cm within the left lobe. There is normal hepatopetal flow visualized within the main portal vein. The gallbladder demonstrates multiple gallbladder is versus adherent stones with the largest measuring 0.5 cm. Some echogenic foci demonstrating ring down artifact is also identified, suggestive of adenomyomatosis. There is no pericholecystic fluid or wall thickening. The common bile duct measures 0.7 cm. The sonographic Murphy's sign is not reported. IMPRESSION: 1. Multiple complex hepatic cysts. Concordant with prior CT imaging. 2. Possible gallbladder polyps versus adherent stones with the largest measuring 0.5 cm. Echogenic foci demonstrating ring down artifact may suggest adenomyomatosis. No biliary dilatation. Thank you for allowing [place] to assist in the care of this patient. Electronically Signed   By: Lestine Box M.D.   On: 10/15/2023 12:47     Assessment/Plan Carotid stenosis, symptomatic, with infarction (HCC) Carotid duplex shows the right carotid stents to be widely patent without recurrent stenosis and the left carotid artery has near normal flow on duplex without any significant disease. Doing well.  Continue medications but OK to stop for procedures.  Follow annually.   Essential hypertension, benign blood pressure control important in reducing the progression of atherosclerotic disease. On appropriate oral medications.     HLD (hyperlipidemia) lipid control important in reducing the progression of  atherosclerotic disease. Continue statin therapy  [name], MD  11/05/2023 11:46 AM    This note was created with Dragon medical transcription system.  Any errors from dictation are purely unintentional

## 2023-11-05 NOTE — Assessment & Plan Note (Signed)
 Carotid duplex shows the right carotid stents to be widely patent without recurrent stenosis and the left carotid artery has near normal flow on duplex without any significant disease. Doing well.  Continue medications but OK to stop for procedures.  Follow annually.

## 2023-11-05 NOTE — Telephone Encounter (Signed)
 FYI- patient has upcoming appt this week. Will discuss with her then.

## 2023-11-05 NOTE — Telephone Encounter (Signed)
 Copied from CRM [id number]. Topic: Referral - Status >> Nov 05, 2023  8:32 AM Orinda Kenner C wrote: Reason for CRM: Lu Duffel from [facility] [phone] contact the patient twice with call and mailed a letter with no response.

## 2023-11-06 ENCOUNTER — Encounter: Payer: Medicare Other | Admitting: Urology

## 2023-11-06 ENCOUNTER — Other Ambulatory Visit (INDEPENDENT_AMBULATORY_CARE_PROVIDER_SITE_OTHER): Payer: Medicare Other

## 2023-11-06 ENCOUNTER — Telehealth: Payer: Self-pay

## 2023-11-06 ENCOUNTER — Ambulatory Visit
Admission: RE | Admit: 2023-11-06 | Discharge: 2023-11-06 | Disposition: A | Discharge status: Home / Self Care | Payer: Medicare Other | Source: Ambulatory Visit | Attending: Urology | Admitting: Urology

## 2023-11-06 DIAGNOSIS — D649 Anemia, unspecified: Secondary | ICD-10-CM | POA: Diagnosis not present

## 2023-11-06 DIAGNOSIS — N1330 Unspecified hydronephrosis: Secondary | ICD-10-CM

## 2023-11-06 DIAGNOSIS — N200 Calculus of kidney: Secondary | ICD-10-CM | POA: Diagnosis not present

## 2023-11-06 DIAGNOSIS — E7800 Pure hypercholesterolemia, unspecified: Secondary | ICD-10-CM | POA: Diagnosis not present

## 2023-11-06 DIAGNOSIS — R739 Hyperglycemia, unspecified: Secondary | ICD-10-CM

## 2023-11-06 LAB — BASIC METABOLIC PANEL
BUN: 13 mg/dL (ref 6–23)
CO2: 29 meq/L (ref 19–32)
Calcium: 8.7 mg/dL (ref 8.4–10.5)
Chloride: 101 meq/L (ref 96–112)
Creatinine, Ser: 0.78 mg/dL (ref 0.40–1.20)
GFR: 79.13 mL/min (ref >60.00)
Glucose, Bld: 95 mg/dL (ref 70–99)
Potassium: 3.5 meq/L (ref 3.5–5.1)
Sodium: 139 meq/L (ref 135–145)

## 2023-11-06 LAB — CBC WITH DIFFERENTIAL/PLATELET
Basophils Absolute: 0.2 10*3/uL — ABNORMAL HIGH (ref 0.0–0.1)
Basophils Relative: 2.4 % (ref 0.0–3.0)
Eosinophils Absolute: 0.2 10*3/uL (ref 0.0–0.7)
Eosinophils Relative: 2.4 % (ref 0.0–5.0)
HCT: 40.9 % (ref 36.0–46.0)
Hemoglobin: 13.5 g/dL (ref 12.0–15.0)
Lymphocytes Relative: 15.5 % (ref 12.0–46.0)
Lymphs Abs: 1.1 10*3/uL (ref 0.7–4.0)
MCHC: 33.1 g/dL (ref 30.0–36.0)
MCV: 92.6 fL (ref 78.0–100.0)
Monocytes Absolute: 0.6 10*3/uL (ref 0.1–1.0)
Monocytes Relative: 7.9 % (ref 3.0–12.0)
Neutro Abs: 5.2 10*3/uL (ref 1.4–7.7)
Neutrophils Relative %: 71.8 % (ref 43.0–77.0)
Platelets: 309 10*3/uL (ref 150.0–400.0)
RBC: 4.42 Mil/uL (ref 3.87–5.11)
RDW: 13.7 % (ref 11.5–15.5)
WBC: 7.2 10*3/uL (ref 4.0–10.5)

## 2023-11-06 LAB — LIPID PANEL
Cholesterol: 111 mg/dL (ref 0–200)
HDL: 39.8 mg/dL (ref >39.00)
LDL Cholesterol: 50 mg/dL (ref 0–99)
NonHDL: 71.12
Total CHOL/HDL Ratio: 3
Triglycerides: 108 mg/dL (ref 0.0–149.0)
VLDL: 21.6 mg/dL (ref 0.0–40.0)

## 2023-11-06 LAB — TSH: TSH: 1.12 u[IU]/mL (ref 0.35–5.50)

## 2023-11-06 LAB — HEPATIC FUNCTION PANEL
ALT: 12 U/L (ref 0–35)
AST: 13 U/L (ref 0–37)
Albumin: 3.9 g/dL (ref 3.5–5.2)
Alkaline Phosphatase: 73 U/L (ref 39–117)
Bilirubin, Direct: 0.1 mg/dL (ref 0.0–0.3)
Total Bilirubin: 0.5 mg/dL (ref 0.2–1.2)
Total Protein: 6.6 g/dL (ref 6.0–8.3)

## 2023-11-06 LAB — HEMOGLOBIN A1C: Hgb A1c MFr Bld: 5.8 % (ref 4.6–6.5)

## 2023-11-06 NOTE — Telephone Encounter (Signed)
 Urine ordered

## 2023-11-06 NOTE — Addendum Note (Signed)
 Addended by: Warden Fillers on: 11/06/2023 02:28 PM   Modules accepted: Orders

## 2023-11-07 ENCOUNTER — Other Ambulatory Visit (HOSPITAL_COMMUNITY): Payer: Self-pay

## 2023-11-07 ENCOUNTER — Telehealth: Payer: Self-pay | Admitting: Pharmacy Technician

## 2023-11-07 ENCOUNTER — Ambulatory Visit: Payer: Self-pay | Admitting: Urology

## 2023-11-07 ENCOUNTER — Ambulatory Visit (INDEPENDENT_AMBULATORY_CARE_PROVIDER_SITE_OTHER): Payer: Medicare Other | Admitting: Internal Medicine

## 2023-11-07 VITALS — BP 118/70 | HR 70 | Temp 98.0°F | Resp 16 | Ht 62.0 in | Wt 134.4 lb

## 2023-11-07 DIAGNOSIS — I700 Atherosclerosis of aorta: Secondary | ICD-10-CM | POA: Diagnosis not present

## 2023-11-07 DIAGNOSIS — N200 Calculus of kidney: Secondary | ICD-10-CM

## 2023-11-07 DIAGNOSIS — R739 Hyperglycemia, unspecified: Secondary | ICD-10-CM

## 2023-11-07 DIAGNOSIS — D3500 Benign neoplasm of unspecified adrenal gland: Secondary | ICD-10-CM | POA: Diagnosis not present

## 2023-11-07 DIAGNOSIS — Z0000 Encounter for general adult medical examination without abnormal findings: Secondary | ICD-10-CM

## 2023-11-07 DIAGNOSIS — I10 Essential (primary) hypertension: Secondary | ICD-10-CM

## 2023-11-07 DIAGNOSIS — K5900 Constipation, unspecified: Secondary | ICD-10-CM

## 2023-11-07 DIAGNOSIS — K7689 Other specified diseases of liver: Secondary | ICD-10-CM

## 2023-11-07 DIAGNOSIS — E7800 Pure hypercholesterolemia, unspecified: Secondary | ICD-10-CM

## 2023-11-07 DIAGNOSIS — F419 Anxiety disorder, unspecified: Secondary | ICD-10-CM | POA: Diagnosis not present

## 2023-11-07 DIAGNOSIS — I6523 Occlusion and stenosis of bilateral carotid arteries: Secondary | ICD-10-CM

## 2023-11-07 DIAGNOSIS — I739 Peripheral vascular disease, unspecified: Secondary | ICD-10-CM

## 2023-11-07 LAB — URINE CULTURE
MICRO NUMBER:: 16132208
SPECIMEN QUALITY:: ADEQUATE

## 2023-11-07 LAB — URINALYSIS, ROUTINE W REFLEX MICROSCOPIC
Bacteria, UA: NONE SEEN /HPF
Bilirubin Urine: NEGATIVE
Glucose, UA: NEGATIVE
Hyaline Cast: NONE SEEN /LPF
Ketones, ur: NEGATIVE
Nitrite: NEGATIVE
RBC / HPF: >=60 /HPF — AB (ref 0–2)
Specific Gravity, Urine: 1.02 (ref 1.001–1.035)
WBC, UA: >=60 /HPF — AB (ref 0–5)
pH: 6.5 (ref 5.0–8.0)

## 2023-11-07 LAB — MICROSCOPIC MESSAGE

## 2023-11-07 MED ORDER — LORAZEPAM 0.5 MG PO TABS: ORAL_TABLET | ORAL | 0 refills | Status: AC

## 2023-11-07 NOTE — Progress Notes (Signed)
 Subjective:    Patient ID: Kim Love, female    DOB: 10/15/1955, 68 y.o.   MRN: [id number]  Patient here for  Chief Complaint  Patient presents with   Annual Exam    HPI Here for a physical exam. Had f/u with [facility] 11/05/23 - f/u carotid stenosis - carotid duplex shows the right carotid stents to be widely patent without recurrent stenosis and the left carotid artery has near normal flow on duplex without any significant disease. Recommended continued annual f/u. Recently evaluated by endocrinology 10/21/23 - left adrenal adenoma. Has labs scheduled to further evaluate. Seeing Dr Apolinar Junes. Recent CT - 1.7 cm left renal stone and left hydronephrosis. S/p left cystoscopy/ureteroscopy/stent placement 10/28/23. Initially with increased pain/spasm. Also reported dysuria. Had stent removed 11/04/23. Has had increased pain this week, but now pain has resolved. No symptoms now. Feels better. Discussed miralax for constipation.    Past Medical History:  Diagnosis Date   Anxiety    History of kidney stones    Hypercholesterolemia    Hypertension    Stroke (HCC) 2022   Tobacco abuse    Past Surgical History:  Procedure Laterality Date   CAROTID PTA/STENT INTERVENTION Right 06/12/2021   Procedure: CAROTID PTA/STENT INTERVENTION;  Surgeon: Annice Needy, MD;  Location: [facility] INVASIVE CV LAB;  Service: Cardiovascular;  Laterality: Right;   CYSTOSCOPY/URETEROSCOPY/HOLMIUM LASER/STENT PLACEMENT Left 10/28/2023   Procedure: CYSTOSCOPY/URETEROSCOPY/HOLMIUM LASER/STENT PLACEMENT;  Surgeon: Vanna Scotland, MD;  Location: [facility] ORS;  Service: Urology;  Laterality: Left;   TONSILLECTOMY AND ADENOIDECTOMY Bilateral 1980   Family History  Problem Relation Age of Onset   Stroke Maternal Grandmother    Stroke Maternal Grandfather    Social History   Socioeconomic History   Marital status: Married    Spouse name: Merchant navy officer   Number of children: 1   Years of education: Not on file   Highest education level: Not  on file  Occupational History    Employer: [employer]  Tobacco Use   Smoking status: Every Day    Current packs/day: 1.00    Average packs/day: 1 pack/day for 41.2 years (41.2 ttl pk-yrs)    Types: Cigarettes    Start date: 09/10/1982   Smokeless tobacco: Never  Vaping Use   Vaping status: Never Used  Substance and Sexual Activity   Alcohol use: No    Comment: "once in a blue moon: I can't remember the last time."   Drug use: No   Sexual activity: Not on file  Other Topics Concern   Not on file  Social History Narrative   Lives at home with spouse       Right handed   Social Drivers of Health   Financial Resource Strain: Medium Risk (08/12/2023)   [facility]   Overall Financial Resource Strain (CARDIA)    Difficulty of Paying Living Expenses: Somewhat hard  Food Insecurity: Food Insecurity Present (08/12/2023)   Received from [facility]   Hunger Vital Sign    Worried About Running Out of Food in the Last Year: Sometimes true    Ran Out of Food in the Last Year: Sometimes true  Transportation Needs: No Transportation Needs (08/12/2023)   Received from [facility]    In the past 12 months, has lack of transportation kept you from medical appointments or from getting medications?: No    Lack of Transportation (Non-Medical): No  Physical Activity: Not  on file  Stress: Not on file  Social Connections: Not on file     Review of Systems  Constitutional:  Negative for appetite change and unexpected weight change.  HENT:  Negative for congestion, sinus pressure and sore throat.   Eyes:  Negative for pain and visual disturbance.  Respiratory:  Negative for cough, chest tightness and shortness of breath.   Cardiovascular:  Negative for chest pain, palpitations and leg swelling.  Gastrointestinal:  Negative for abdominal pain, diarrhea, nausea and vomiting.  Genitourinary:   Negative for difficulty urinating.       No pain with urination currently.   Musculoskeletal:  Negative for joint swelling and myalgias.  Skin:  Negative for color change and rash.  Neurological:  Negative for dizziness and headaches.  Hematological:  Negative for adenopathy. Does not bruise/bleed easily.  Psychiatric/Behavioral:  Negative for agitation and dysphoric mood.        Objective:     BP 118/70   Pulse 70   Temp 98 F (36.7 C)   Resp 16   Ht 5\' 2"  (1.575 m)   Wt 134 lb 6.4 oz (61 kg)   SpO2 98%   BMI 24.58 kg/m  Wt Readings from Last 3 Encounters:  11/07/23 134 lb 6.4 oz (61 kg)  11/05/23 133 lb 6.4 oz (60.5 kg)  10/28/23 133 lb (60.3 kg)    Physical Exam Vitals reviewed.  Constitutional:      General: She is not in acute distress.    Appearance: Normal appearance. She is well-developed.  HENT:     Head: Normocephalic and atraumatic.     Right Ear: External ear normal.     Left Ear: External ear normal.     Mouth/Throat:     Pharynx: No oropharyngeal exudate or posterior oropharyngeal erythema.  Eyes:     General: No scleral icterus.       Right eye: No discharge.        Left eye: No discharge.     Conjunctiva/sclera: Conjunctivae normal.  Neck:     Thyroid: No thyromegaly.  Cardiovascular:     Rate and Rhythm: Normal rate and regular rhythm.  Pulmonary:     Effort: No tachypnea, accessory muscle usage or respiratory distress.     Breath sounds: Normal breath sounds. No decreased breath sounds or wheezing.  Chest:  Breasts:    Right: No inverted nipple, mass, nipple discharge or tenderness (no axillary adenopathy).     Left: No inverted nipple, mass, nipple discharge or tenderness (no axilarry adenopathy).  Abdominal:     General: Bowel sounds are normal.     Palpations: Abdomen is soft.     Tenderness: There is no abdominal tenderness.  Musculoskeletal:        General: No swelling or tenderness.     Cervical back: Neck supple.   Lymphadenopathy:     Cervical: No cervical adenopathy.  Skin:    Findings: No erythema or rash.  Neurological:     Mental Status: She is alert and oriented to person, place, and time.  Psychiatric:        Mood and Affect: Mood normal.        Behavior: Behavior normal.         Outpatient Encounter Medications as of 11/07/2023  Medication Sig   LORazepam (ATIVAN) 0.5 MG tablet 1/2 - 1 tablet q day prn   amLODipine (NORVASC) 5 MG tablet Take 1 tablet (5 mg total) by mouth daily. (Patient taking differently:  Take 5 mg by mouth at bedtime.)   aspirin EC 81 MG tablet Take 81 mg by mouth at bedtime. Swallow whole.   cholecalciferol (VITAMIN D3) 25 MCG (1000 UNIT) tablet Take 5,000 Units by mouth every other day.   citalopram (CELEXA) 20 MG tablet Take 1 tablet (20 mg total) by mouth daily.   clopidogrel (PLAVIX) 75 MG tablet TAKE 1 TABLET BY MOUTH EVERY DAY   fluticasone (FLONASE) 50 MCG/ACT nasal spray Place 2 sprays into both nostrils daily. (Patient taking differently: Place 2 sprays into both nostrils as needed.)   losartan-hydrochlorothiazide (HYZAAR) 100-25 MG tablet Take 1 tablet by mouth daily.   potassium chloride (KLOR-CON) 10 MEQ tablet Take 1 tablet (10 mEq total) by mouth daily.   rosuvastatin (CRESTOR) 40 MG tablet Take 1 tablet (40 mg total) by mouth daily. (Patient taking differently: Take 40 mg by mouth at bedtime.)   tamsulosin (FLOMAX) 0.4 MG CAPS capsule Take 1 capsule (0.4 mg total) by mouth daily.   vitamin B-12 (CYANOCOBALAMIN) 1000 MCG tablet Take 1,000 mcg by mouth daily.   [DISCONTINUED] amoxicillin-clavulanate (AUGMENTIN) 875-125 MG tablet Take 1 tablet by mouth 2 (two) times daily.   [DISCONTINUED] HYDROcodone-acetaminophen (NORCO/VICODIN) 5-325 MG tablet Take 1-2 tablets by mouth every 6 (six) hours as needed for moderate pain (pain score 4-6).   [DISCONTINUED] oxybutynin (DITROPAN) 5 MG tablet Take 1 tablet (5 mg total) by mouth every 8 (eight) hours as needed  for bladder spasms.   No facility-administered encounter medications on file as of 11/07/2023.     Lab Results  Component Value Date   WBC 7.2 11/06/2023   HGB 13.5 11/06/2023   HCT 40.9 11/06/2023   PLT 309.0 11/06/2023   GLUCOSE 95 11/06/2023   CHOL 111 11/06/2023   TRIG 108.0 11/06/2023   HDL 39.80 11/06/2023   LDLDIRECT 129.0 03/26/2018   LDLCALC 50 11/06/2023   ALT 12 11/06/2023   AST 13 11/06/2023   NA 139 11/06/2023   K 3.5 11/06/2023   CL 101 11/06/2023   CREATININE 0.78 11/06/2023   BUN 13 11/06/2023   CO2 29 11/06/2023   TSH 1.12 11/06/2023   INR 1.0 06/01/2021   HGBA1C 5.8 11/06/2023       Assessment & Plan:  Routine general medical examination at a health care facility  Health care maintenance Assessment & Plan: Physical today 11/07/23.  Sees gyn.  PAP 02/28/23 (negative - atrophy - negative HPV). Performed by Dr Dareen Piano - [facility]. Mammogram 02/28/23 - Birads I.  Colonoscopy 02/2015.  Referred to GI - f/u colonoscopy.     Adrenal adenoma, unspecified laterality Assessment & Plan: Recent CT - Mild bilateral adrenal thickening/hyperplasia. A 1.7 cm left adrenal adenoma. Saw endocrinology. Planning for f/u labs in March to further evaluate.  .   Anxiety Assessment & Plan: Continues citalopram. Increased stress. Discussed. Request refill for lorazepam to have prn. Follow.    Aortic atherosclerosis (HCC) Assessment & Plan: Continue atorvasttin.    Bilateral carotid artery stenosis Assessment & Plan: Had f/u with [facility] 11/05/23 - f/u carotid stenosis - carotid duplex shows the right carotid stents to be widely patent without recurrent stenosis and the left carotid artery has near normal flow on duplex without any significant disease. Recommended continued annual f/u.    Constipation, unspecified constipation type Assessment & Plan: Miralax.    Essential hypertension, benign Assessment & Plan: Continue losartan/hydrochlorothiazide and  amlodipine. Follow pressures. Follow metabolic panel.    Hypercholesterolemia Assessment & Plan: On  crestor. Low cholesterol diet and exercise. Follow lipid panel and liver function tests.   Lab Results  Component Value Date   CHOL 111 11/06/2023   HDL 39.80 11/06/2023   LDLCALC 50 11/06/2023   LDLDIRECT 129.0 03/26/2018   TRIG 108.0 11/06/2023   CHOLHDL 3 11/06/2023      Hyperglycemia Assessment & Plan: Low carb diet and exercise. Follow met b and A1c.    Liver cyst Assessment & Plan: Recent CT - Probable small liver cysts. Further evaluation with ultrasound recommended.discussed.  abdominal ultrasound - complex liver cyst - referred to GI.    Nephrolithiasis Assessment & Plan: Seeing Dr Apolinar Junes. Recent CT - 1.7 cm left renal stone and left hydronephrosis. S/p left cystoscopy/ureteroscopy/stent placement 10/28/23. Initially with increased pain/spasm. Also reported dysuria. Had stent removed 11/04/23. Has had increased pain this week, but now pain has resolved. No symptoms now. Feels better.    PAD (peripheral artery disease) (HCC) Assessment & Plan: S/p stent (carotid) - followed by vascular surgery.  Continue risk factor modification.    Other orders -     LORazepam; 1/2 - 1 tablet q day prn  Dispense: 30 tablet; Refill: 0     Dale [place], MD

## 2023-11-07 NOTE — Telephone Encounter (Signed)
 Pharmacy Patient Advocate Encounter   Received notification from  [facility] Portal that prior authorization for LORazepam 0.5MG  tabletsis required/requested.   Insurance verification completed.   The patient is insured through [facility] .   Per test claim: PA required; PA submitted to above mentioned insurance via CoverMyMeds Key/confirmation #/[id number] [facility] Status is pending

## 2023-11-07 NOTE — Assessment & Plan Note (Addendum)
 Physical today 11/07/23.  Sees gyn.  PAP 02/28/23 (negative - atrophy - negative HPV). Performed by Dr Dareen Piano - [facility]. Mammogram 02/28/23 - Birads I.  Colonoscopy 02/2015.  Referred to GI - f/u colonoscopy.

## 2023-11-08 ENCOUNTER — Other Ambulatory Visit (HOSPITAL_COMMUNITY): Payer: Self-pay

## 2023-11-08 NOTE — Telephone Encounter (Signed)
 Pharmacy Patient Advocate Encounter  Received notification from [facility] that Prior Authorization for LORazepam 0.5MG  tablets has been APPROVED from 11/07/2023 to 11/06/2024. Unable to obtain price due to refill too soon rejection, last fill date 11/08/2023 next available fill date03/23/2025   PA #/Case ID/Reference #: [id number]

## 2023-11-10 ENCOUNTER — Encounter: Payer: Self-pay | Admitting: Internal Medicine

## 2023-11-10 NOTE — Assessment & Plan Note (Signed)
 On crestor. Low cholesterol diet and exercise. Follow lipid panel and liver function tests.   Lab Results  Component Value Date   CHOL 111 11/06/2023   HDL 39.80 11/06/2023   LDLCALC 50 11/06/2023   LDLDIRECT 129.0 03/26/2018   TRIG 108.0 11/06/2023   CHOLHDL 3 11/06/2023

## 2023-11-10 NOTE — Assessment & Plan Note (Signed)
 Low-carb diet and exercise.  Follow met b and A1c.

## 2023-11-10 NOTE — Assessment & Plan Note (Signed)
 Had f/u with [facility] 11/05/23 - f/u carotid stenosis - carotid duplex shows the right carotid stents to be widely patent without recurrent stenosis and the left carotid artery has near normal flow on duplex without any significant disease. Recommended continued annual f/u.

## 2023-11-10 NOTE — Assessment & Plan Note (Signed)
 Seeing Dr Apolinar Junes. Recent CT - 1.7 cm left renal stone and left hydronephrosis. S/p left cystoscopy/ureteroscopy/stent placement 10/28/23. Initially with increased pain/spasm. Also reported dysuria. Had stent removed 11/04/23. Has had increased pain this week, but now pain has resolved. No symptoms now. Feels better.

## 2023-11-10 NOTE — Assessment & Plan Note (Signed)
 Continue losartan/hydrochlorothiazide and amlodipine. Follow pressures. Follow metabolic panel.

## 2023-11-10 NOTE — Assessment & Plan Note (Signed)
 Continues citalopram. Increased stress. Discussed. Request refill for lorazepam to have prn. Follow.

## 2023-11-10 NOTE — Assessment & Plan Note (Signed)
 Recent CT - Probable small liver cysts. Further evaluation with ultrasound recommended.discussed.  abdominal ultrasound - complex liver cyst - referred to GI.

## 2023-11-10 NOTE — Assessment & Plan Note (Signed)
 Miralax

## 2023-11-10 NOTE — Assessment & Plan Note (Signed)
S/p stent (carotid) - followed by vascular surgery.  Continue risk factor modification.  

## 2023-11-10 NOTE — Assessment & Plan Note (Signed)
 Continue atorvasttin.

## 2023-11-10 NOTE — Assessment & Plan Note (Signed)
 Recent CT - Mild bilateral adrenal thickening/hyperplasia. A 1.7 cm left adrenal adenoma. Saw endocrinology. Planning for f/u labs in March to further evaluate.  Kim Love

## 2023-11-12 ENCOUNTER — Telehealth: Payer: Self-pay | Admitting: Urology

## 2023-11-12 DIAGNOSIS — D3502 Benign neoplasm of left adrenal gland: Secondary | ICD-10-CM | POA: Diagnosis not present

## 2023-11-12 NOTE — Telephone Encounter (Signed)
 Pt informed, voiced understanding

## 2023-11-12 NOTE — Telephone Encounter (Signed)
 Patient called wanting to know when she can stop taking the Flomax that was given to her. She had a cysto on 2/17 and she took her stent out on the 24th of Feb. When can she stop taking the Flomax?

## 2023-11-12 NOTE — Telephone Encounter (Signed)
 Yes, ok to stop flomax.    Vanna Scotland, MD

## 2023-11-19 ENCOUNTER — Other Ambulatory Visit: Payer: Self-pay | Admitting: Urology

## 2023-11-25 DIAGNOSIS — Z800 Family history of malignant neoplasm of digestive organs: Secondary | ICD-10-CM | POA: Diagnosis not present

## 2023-11-25 DIAGNOSIS — Z83719 Family history of colon polyps, unspecified: Secondary | ICD-10-CM | POA: Diagnosis not present

## 2023-11-25 DIAGNOSIS — R932 Abnormal findings on diagnostic imaging of liver and biliary tract: Secondary | ICD-10-CM | POA: Diagnosis not present

## 2023-11-27 ENCOUNTER — Telehealth: Payer: Self-pay

## 2023-11-27 DIAGNOSIS — N200 Calculus of kidney: Secondary | ICD-10-CM

## 2023-11-27 NOTE — Telephone Encounter (Signed)
 [place] was completed too soon per orders, this should have been done closer to march 31st. Sending to Dr Apolinar Junes for review.

## 2023-11-27 NOTE — Telephone Encounter (Signed)
 Please reassure the patient and her follow-up visit appropriate in terms of timing.  Her stone was very large stone and created a lot of dust which is probably what is being appreciated in the kidney as well as in the bladder on ultrasound.  I will discuss this further at her follow-up.  Lets go ahead and get a KUB on the day of her follow-up just to prove that theory.  Vanna Scotland, MD

## 2023-11-27 NOTE — Telephone Encounter (Signed)
 Patient called about U/S results- scheduled to follow up on 4/9. States that she is concerned she still has a stone and bladder stone. She wanted to know if she could be seen sooner or if someone could call her to discuss.

## 2023-11-27 NOTE — Telephone Encounter (Signed)
 Called patient but was not able to leave a message, sent mychart message to the patient.

## 2023-11-29 DIAGNOSIS — D3502 Benign neoplasm of left adrenal gland: Secondary | ICD-10-CM | POA: Diagnosis not present

## 2023-12-18 ENCOUNTER — Ambulatory Visit
Admission: RE | Admit: 2023-12-18 | Discharge: 2023-12-18 | Disposition: A | Discharge status: Home / Self Care | Attending: Urology | Admitting: Urology

## 2023-12-18 ENCOUNTER — Ambulatory Visit: Payer: Medicare Other | Admitting: Urology

## 2023-12-18 ENCOUNTER — Ambulatory Visit
Admission: RE | Admit: 2023-12-18 | Discharge: 2023-12-18 | Disposition: A | Discharge status: Home / Self Care | Source: Ambulatory Visit | Attending: Urology | Admitting: Urology

## 2023-12-18 VITALS — BP 120/79 | HR 83 | Ht 62.0 in | Wt 135.5 lb

## 2023-12-18 DIAGNOSIS — N200 Calculus of kidney: Secondary | ICD-10-CM | POA: Insufficient documentation

## 2023-12-18 DIAGNOSIS — K5641 Fecal impaction: Secondary | ICD-10-CM | POA: Diagnosis not present

## 2023-12-18 DIAGNOSIS — Z87442 Personal history of urinary calculi: Secondary | ICD-10-CM

## 2023-12-18 NOTE — Progress Notes (Signed)
 [name],acting as a scribe for Kim Gimenez, MD.,have documented all relevant documentation on the behalf of Kim Gimenez, MD,as directed by  Kim Gimenez, MD while in the presence of Kim Gimenez, MD.  12/18/23 12:39 PM   Kim Love 04/22/1956 [id number]  Referring provider: Dellar Fenton, MD [address] [place],  [place] [address]  Chief Complaint  Patient presents with   Follow-up    HPI:  68 year-old female with a history of a large 1.7-centimeter left renal pelvic stone. She underwent a radioscopic intervention for this stone in mid-February, her stent was subsequently removed.   She followed up with a renal ultrasound that showed some echogenic material in the bladder and left kidney, along with mild left residual pelviectasis but no overt hydronephrosis. I personally reviewed this study today. We were unable to send a stone analysis due to the degree of dusting.   An X-ray was performed to assess residual stone burden, but it was challenging to interpret due to significant bowel gas, although no obvious stone burden was noted. This was directly compared to the intraoperative imaging and preoperative CT scan.  She reports feeling much better and no longer experiences the discomfort she felt prior to the procedure. She noticed a lot of stone dust after the stent was removed, which lasted for about two to three days.   PMH: Past Medical History:  Diagnosis Date   Anxiety    History of kidney stones    Hypercholesterolemia    Hypertension    Stroke (HCC) 2022   Tobacco abuse     Surgical History: Past Surgical History:  Procedure Laterality Date   CAROTID PTA/STENT INTERVENTION Right 06/12/2021   Procedure: CAROTID PTA/STENT INTERVENTION;  Surgeon: Celso College, MD;  Location: [facility] INVASIVE CV [facility];  Service: Cardiovascular;  Laterality: Right;   CYSTOSCOPY/URETEROSCOPY/HOLMIUM LASER/STENT PLACEMENT Left 10/28/2023   Procedure:  CYSTOSCOPY/URETEROSCOPY/HOLMIUM LASER/STENT PLACEMENT;  Surgeon: Kim Gimenez, MD;  Location: [facility] ORS;  Service: Urology;  Laterality: Left;   TONSILLECTOMY AND ADENOIDECTOMY Bilateral 1980    Home Medications:  Allergies as of 12/18/2023       Reactions   Povidone-iodine Swelling   Sulfamethoxazole-trimethoprim Nausea And Vomiting   Atenolol Other (See Comments)   Bradycardia   Ciprofloxacin    Other Reaction(s): keeped her awake   Codeine    REACTION: "gallbladder issues"   Erythromycin    REACTION: nausea   Erythromycin    Upset stomach   Olmesartan    Other reaction(s): Other (See Comments) Increased BP.   Prednisone    Other Reaction(s): raise patient blood pressure   Sulfa Antibiotics    Codeine    Gallbladder issues        Medication List        Accurate as of December 18, 2023 12:39 PM. If you have any questions, ask your nurse or doctor.          STOP taking these medications    tamsulosin 0.4 MG Caps capsule Commonly known as: Flomax       TAKE these medications    amLODipine 5 MG tablet Commonly known as: NORVASC Take 1 tablet (5 mg total) by mouth daily. What changed: when to take this   aspirin EC 81 MG tablet Take 81 mg by mouth at bedtime. Swallow whole.   cholecalciferol 25 MCG (1000 UNIT) tablet Commonly known as: VITAMIN D3 Take 5,000 Units by mouth every other day.   citalopram 20 MG tablet  Commonly known as: CELEXA Take 1 tablet (20 mg total) by mouth daily.   clopidogrel 75 MG tablet Commonly known as: PLAVIX TAKE 1 TABLET BY MOUTH EVERY DAY   cyanocobalamin 1000 MCG tablet Commonly known as: VITAMIN B12 Take 1,000 mcg by mouth daily.   fluticasone 50 MCG/ACT nasal spray Commonly known as: FLONASE Place 2 sprays into both nostrils daily. What changed:  when to take this reasons to take this   LORazepam 0.5 MG tablet Commonly known as: ATIVAN 1/2 - 1 tablet q day prn   losartan-hydrochlorothiazide 100-25 MG  tablet Commonly known as: HYZAAR Take 1 tablet by mouth daily.   potassium chloride 10 MEQ tablet Commonly known as: KLOR-CON Take 1 tablet (10 mEq total) by mouth daily.   rosuvastatin 40 MG tablet Commonly known as: CRESTOR Take 1 tablet (40 mg total) by mouth daily. What changed: when to take this        Allergies:  Allergies  Allergen Reactions   Povidone-Iodine Swelling   Sulfamethoxazole-Trimethoprim Nausea And Vomiting   Atenolol Other (See Comments)    Bradycardia   Ciprofloxacin     Other Reaction(s): keeped her awake   Codeine     REACTION: "gallbladder issues"   Erythromycin     REACTION: nausea   Erythromycin     Upset stomach   Olmesartan     Other reaction(s): Other (See Comments) Increased BP.   Prednisone     Other Reaction(s): raise patient blood pressure   Sulfa Antibiotics    Codeine     Gallbladder issues    Family History: Family History  Problem Relation Age of Onset   Stroke Maternal Grandmother    Stroke Maternal Grandfather     Social History:  reports that she has been smoking cigarettes. She started smoking about 41 years ago. She has a 41.3 pack-year smoking history. She has never used smokeless tobacco. She reports that she does not drink alcohol and does not use drugs.   Physical Exam: BP 120/79   Pulse 83   Ht 5\' 2"  (1.575 m)   Wt 135 lb 8 oz (61.5 kg)   BMI 24.78 kg/m   Constitutional:  Alert and oriented, No acute distress. HEENT: [place] AT, moist mucus membranes.  Trachea midline, no masses. Neurologic: Grossly intact, no focal deficits, moving all 4 extremities. Psychiatric: Normal mood and affect.   Pertinent Imaging:  KUB performed today was personally reviewed. Radiologic interpretation is pending.    EXAM: RENAL / URINARY TRACT ULTRASOUND COMPLETE  COMPARISON:  Ultrasound abdomen 10/15/2023  FINDINGS: Right Kidney:  Renal measurements: 12.0 x 5.5 x 5.4 cm = volume: 187.3 mL. Echogenicity within normal  limits. No mass or hydronephrosis visualized.  Left Kidney:  Renal measurements: 11.3 x 5.3 x 5.6 cm = volume: 176.1 mL. Normal renal cortical thickness and echogenicity. Mild pelviectasis. Possible 12 mm stone.  Bladder:  9 mm echogenic region posterior bladder.  Other:  None.  IMPRESSION: 1. Mild left pelviectasis. 2. Possible 12 mm left renal stone. 3. 9 mm echogenic region posterior bladder. This may represent a bladder stone. Recommend correlation with urinalysis and/or direct visualization.   Electronically Signed By: Jone Neither M.D. On: 11/11/2023 22:38  This was personally reviewed and I agree with the radiologic interpretation.  Assessment & Plan:    1. Post-operative status of renal pelvic stone - She underwent a radioscopic intervention for a large renal pelvic stone, and the stent was removed in mid-February.  - The recent  ultrasound showed some residual stone dust, but no significant residual stone burden is evident on the current X-ray.  - She reports feeling much better and does not experience flank pain.  - No immediate intervention is required. If flank pain recurs, a CT scan may be considered to assess for any residual stone. - We discussed general stone prevention techniques including drinking plenty water with goal of producing 2.5 L urine daily, increased citric acid intake, avoidance of high oxalate containing foods, and decreased salt intake.  Information about dietary recommendations given today.    Return in about 1 year (around 12/17/2024) for KUB to monitor for new stone formation.  I have reviewed the above documentation for accuracy and completeness, and I agree with the above.   Kim Gimenez, MD    [facility] [address], [address] [place], [place] [address] [phone]

## 2023-12-18 NOTE — Patient Instructions (Addendum)
 Kim Love

## 2023-12-23 DIAGNOSIS — D128 Benign neoplasm of rectum: Secondary | ICD-10-CM | POA: Diagnosis not present

## 2023-12-23 DIAGNOSIS — K5730 Diverticulosis of large intestine without perforation or abscess without bleeding: Secondary | ICD-10-CM | POA: Diagnosis not present

## 2023-12-23 DIAGNOSIS — Z1211 Encounter for screening for malignant neoplasm of colon: Secondary | ICD-10-CM | POA: Diagnosis not present

## 2023-12-23 DIAGNOSIS — K635 Polyp of colon: Secondary | ICD-10-CM | POA: Diagnosis not present

## 2023-12-23 DIAGNOSIS — Z800 Family history of malignant neoplasm of digestive organs: Secondary | ICD-10-CM | POA: Diagnosis not present

## 2023-12-23 DIAGNOSIS — Z83719 Family history of colon polyps, unspecified: Secondary | ICD-10-CM | POA: Diagnosis not present

## 2023-12-25 DIAGNOSIS — D128 Benign neoplasm of rectum: Secondary | ICD-10-CM | POA: Diagnosis not present

## 2023-12-25 DIAGNOSIS — K635 Polyp of colon: Secondary | ICD-10-CM | POA: Diagnosis not present

## 2024-01-09 DIAGNOSIS — K080 Exfoliation of teeth due to systemic causes: Secondary | ICD-10-CM | POA: Diagnosis not present

## 2024-01-28 ENCOUNTER — Encounter (INDEPENDENT_AMBULATORY_CARE_PROVIDER_SITE_OTHER): Payer: Self-pay

## 2024-02-21 ENCOUNTER — Other Ambulatory Visit (INDEPENDENT_AMBULATORY_CARE_PROVIDER_SITE_OTHER): Payer: Self-pay | Admitting: Nurse Practitioner

## 2024-03-03 ENCOUNTER — Telehealth: Payer: Self-pay | Admitting: Internal Medicine

## 2024-03-03 DIAGNOSIS — R739 Hyperglycemia, unspecified: Secondary | ICD-10-CM

## 2024-03-03 DIAGNOSIS — E7800 Pure hypercholesterolemia, unspecified: Secondary | ICD-10-CM

## 2024-03-03 DIAGNOSIS — D649 Anemia, unspecified: Secondary | ICD-10-CM

## 2024-03-03 NOTE — Telephone Encounter (Signed)
 Patient need lab orders.

## 2024-03-04 ENCOUNTER — Other Ambulatory Visit (INDEPENDENT_AMBULATORY_CARE_PROVIDER_SITE_OTHER): Payer: Medicare Other

## 2024-03-04 DIAGNOSIS — D649 Anemia, unspecified: Secondary | ICD-10-CM | POA: Diagnosis not present

## 2024-03-04 DIAGNOSIS — E7800 Pure hypercholesterolemia, unspecified: Secondary | ICD-10-CM

## 2024-03-04 DIAGNOSIS — R739 Hyperglycemia, unspecified: Secondary | ICD-10-CM | POA: Diagnosis not present

## 2024-03-04 LAB — LIPID PANEL
Cholesterol: 112 mg/dL (ref 0–200)
HDL: 45 mg/dL (ref >39.00)
LDL Cholesterol: 55 mg/dL (ref 0–99)
NonHDL: 66.78
Total CHOL/HDL Ratio: 2
Triglycerides: 61 mg/dL (ref 0.0–149.0)
VLDL: 12.2 mg/dL (ref 0.0–40.0)

## 2024-03-04 LAB — BASIC METABOLIC PANEL WITH GFR
BUN: 8 mg/dL (ref 6–23)
CO2: 31 meq/L (ref 19–32)
Calcium: 9.1 mg/dL (ref 8.4–10.5)
Chloride: 100 meq/L (ref 96–112)
Creatinine, Ser: 0.86 mg/dL (ref 0.40–1.20)
GFR: 70.22 mL/min (ref >60.00)
Glucose, Bld: 91 mg/dL (ref 70–99)
Potassium: 3.5 meq/L (ref 3.5–5.1)
Sodium: 138 meq/L (ref 135–145)

## 2024-03-04 LAB — HEPATIC FUNCTION PANEL
ALT: 13 U/L (ref 0–35)
AST: 16 U/L (ref 0–37)
Albumin: 4.1 g/dL (ref 3.5–5.2)
Alkaline Phosphatase: 70 U/L (ref 39–117)
Bilirubin, Direct: 0.1 mg/dL (ref 0.0–0.3)
Total Bilirubin: 0.4 mg/dL (ref 0.2–1.2)
Total Protein: 6.9 g/dL (ref 6.0–8.3)

## 2024-03-04 LAB — HEMOGLOBIN A1C: Hgb A1c MFr Bld: 5.8 % (ref 4.6–6.5)

## 2024-03-05 ENCOUNTER — Ambulatory Visit: Payer: Self-pay | Admitting: Internal Medicine

## 2024-03-06 ENCOUNTER — Encounter: Payer: Self-pay | Admitting: Internal Medicine

## 2024-03-06 ENCOUNTER — Ambulatory Visit (INDEPENDENT_AMBULATORY_CARE_PROVIDER_SITE_OTHER): Payer: Medicare Other | Admitting: Internal Medicine

## 2024-03-06 VITALS — BP 120/70 | HR 74 | Temp 97.9°F | Resp 16 | Ht 62.0 in | Wt 135.6 lb

## 2024-03-06 DIAGNOSIS — D649 Anemia, unspecified: Secondary | ICD-10-CM | POA: Diagnosis not present

## 2024-03-06 DIAGNOSIS — I10 Essential (primary) hypertension: Secondary | ICD-10-CM

## 2024-03-06 DIAGNOSIS — I739 Peripheral vascular disease, unspecified: Secondary | ICD-10-CM

## 2024-03-06 DIAGNOSIS — E7800 Pure hypercholesterolemia, unspecified: Secondary | ICD-10-CM

## 2024-03-06 DIAGNOSIS — R739 Hyperglycemia, unspecified: Secondary | ICD-10-CM

## 2024-03-06 DIAGNOSIS — F419 Anxiety disorder, unspecified: Secondary | ICD-10-CM

## 2024-03-06 DIAGNOSIS — I700 Atherosclerosis of aorta: Secondary | ICD-10-CM

## 2024-03-06 DIAGNOSIS — I6523 Occlusion and stenosis of bilateral carotid arteries: Secondary | ICD-10-CM

## 2024-03-06 DIAGNOSIS — D3500 Benign neoplasm of unspecified adrenal gland: Secondary | ICD-10-CM | POA: Diagnosis not present

## 2024-03-06 DIAGNOSIS — K7689 Other specified diseases of liver: Secondary | ICD-10-CM

## 2024-03-06 NOTE — Progress Notes (Unsigned)
 Subjective:    Patient ID: Kim Love, female    DOB: 1955-10-10, 68 y.o.   MRN: [id number]  Patient here for  Chief Complaint  Patient presents with   Medical Management of Chronic Issues    HPI Here for a scheduled follow up - follow up regarding hypertension and hypercholesterolemia. Had f/u with [facility] 11/05/23 - f/u carotid stenosis - carotid duplex shows the right carotid stents to be widely patent without recurrent stenosis and the left carotid artery has near normal flow on duplex without any significant disease. Recommended continued annual f/u. Recently evaluated by endocrinology 10/21/23 - left adrenal adenoma. Has labs scheduled to further evaluate. Seeing Dr Penne. Recent CT - 1.7 cm left renal stone and left hydronephrosis. S/p left cystoscopy/ureteroscopy/stent placement 10/28/23. Had f/u with urology 12/18/23 - ultrasound - some residual stone dust, but no significant residual stone burden. Recommended general stone prevention techniques and f/u with one year for KUB. Saw GI 11/25/23 - evaluate liver cyst. Recommended to repeat ultrasound in one year. Recommended colonoscopy.    Past Medical History:  Diagnosis Date   Anxiety    History of kidney stones    Hypercholesterolemia    Hypertension    Stroke (HCC) 2022   Tobacco abuse    Past Surgical History:  Procedure Laterality Date   CAROTID PTA/STENT INTERVENTION Right 06/12/2021   Procedure: CAROTID PTA/STENT INTERVENTION;  Surgeon: Marea Selinda RAMAN, MD;  Location: [facility] INV[facility]IVE CV [facility];  Service: Cardiovascular;  Laterality: Right;   CYSTOSCOPY/URETEROSCOPY/HOLMIUM LASER/STENT PLACEMENT Left 10/28/2023   Procedure: CYSTOSCOPY/URETEROSCOPY/HOLMIUM LASER/STENT PLACEMENT;  Surgeon: Penne Knee, MD;  Location: [facility] ORS;  Service: Urology;  Laterality: Left;   TONSILLECTOMY AND ADENOIDECTOMY Bilateral 1980   Family History  Problem Relation Age of Onset   Stroke Maternal Grandmother    Stroke Maternal Grandfather    Social  History   Socioeconomic History   Marital status: Married    Spouse name: Merchant navy officer   Number of children: 1   Years of education: Not on file   Highest education level: Not on file  Occupational History    Employer: [employer]  Tobacco Use   Smoking status: Every Day    Current packs/day: 1.00    Average packs/day: 1 pack/day for 41.5 years (41.5 ttl pk-yrs)    Types: Cigarettes    Start date: 09/10/1982   Smokeless tobacco: Never  Vaping Use   Vaping status: Never Used  Substance and Sexual Activity   Alcohol use: No    Comment: once in a blue moon: I can't remember the last time.   Drug use: No   Sexual activity: Not on file  Other Topics Concern   Not on file  Social History Narrative   Lives at home with spouse       Right handed   Social Drivers of Health   Financial Resource Strain: Medium Risk (08/12/2023)   Received from [facility]   Overall Financial Resource Strain (CARDIA)    Difficulty of Paying Living Expenses: Somewhat hard  Food Insecurity: Food Insecurity Present (08/12/2023)   Received from [facility]   Hunger Vital Sign    Within the past 12 months, you worried that your food would run out before you got the money to buy more.: Sometimes true    Within the past 12 months, the food you bought just didn't last and you didn't have money to get more.: Sometimes true  Transportation Needs: No Transportation  Needs (08/12/2023)   Received from [facility] - Transportation    In the past 12 months, has lack of transportation kept you from medical appointments or from getting medications?: No    Lack of Transportation (Non-Medical): No  Physical Activity: Not on file  Stress: Not on file  Social Connections: Not on file     Review of Systems     Objective:     BP 120/70   Pulse 74   Temp 97.9 F (36.6 C)   Resp 16   Ht 5' 2 (1.575 m)   Wt 135 lb 9.6 oz (61.5 kg)   SpO2 98%    BMI 24.80 kg/m  Wt Readings from Last 3 Encounters:  03/06/24 135 lb 9.6 oz (61.5 kg)  12/18/23 135 lb 8 oz (61.5 kg)  11/07/23 134 lb 6.4 oz (61 kg)    Physical Exam  {Perform Simple Foot Exam  Perform Detailed exam:1} {Insert foot Exam (Optional):30965}   Outpatient Encounter Medications as of 03/06/2024  Medication Sig   amLODipine  (NORVASC ) 5 MG tablet Take 1 tablet (5 mg total) by mouth daily. (Patient taking differently: Take 5 mg by mouth at bedtime.)   aspirin  EC 81 MG tablet Take 81 mg by mouth at bedtime. Swallow whole.   cholecalciferol  (VITAMIN D3) 25 MCG (1000 UNIT) tablet Take 5,000 Units by mouth every other day.   citalopram  (CELEXA ) 20 MG tablet Take 1 tablet (20 mg total) by mouth daily.   clopidogrel  (PLAVIX ) 75 MG tablet TAKE 1 TABLET BY MOUTH EVERY DAY   fluticasone  (FLONASE ) 50 MCG/ACT nasal spray Place 2 sprays into both nostrils daily. (Patient taking differently: Place 2 sprays into both nostrils as needed.)   LORazepam  (ATIVAN ) 0.5 MG tablet 1/2 - 1 tablet q day prn   losartan -hydrochlorothiazide  (HYZAAR) 100-25 MG tablet Take 1 tablet by mouth daily.   potassium chloride  (KLOR-CON ) 10 MEQ tablet Take 1 tablet (10 mEq total) by mouth daily.   rosuvastatin  (CRESTOR ) 40 MG tablet Take 1 tablet (40 mg total) by mouth daily. (Patient taking differently: Take 40 mg by mouth at bedtime.)   vitamin B-12 (CYANOCOBALAMIN ) 1000 MCG tablet Take 1,000 mcg by mouth daily.   No facility-administered encounter medications on file as of 03/06/2024.     Lab Results  Component Value Date   WBC 7.2 11/06/2023   HGB 13.5 11/06/2023   HCT 40.9 11/06/2023   PLT 309.0 11/06/2023   GLUCOSE 91 03/04/2024   CHOL 112 03/04/2024   TRIG 61.0 03/04/2024   HDL 45.00 03/04/2024   LDLDIRECT 129.0 03/26/2018   LDLCALC 55 03/04/2024   ALT 13 03/04/2024   AST 16 03/04/2024   NA 138 03/04/2024   K 3.5 03/04/2024   CL 100 03/04/2024   CREATININE 0.86 03/04/2024   BUN 8 03/04/2024    CO2 31 03/04/2024   TSH 1.12 11/06/2023   INR 1.0 06/01/2021   HGBA1C 5.8 03/04/2024    Abdomen 1 view (KUB) Result Date: 12/20/2023 CLINICAL DATA:  Renal ultrasound November 06, 2023 CT abdomen September 10, 2023 EXAM: ABDOMEN - 1 VIEW COMPARISON:  CT abdomen September 10, 2023 FINDINGS: 11 mm calcific density is noted in the right upper quadrant below the right twelfth rib. Not appreciated on the prior CT. Previously described 1.7 cm stone in the left renal pelvis is not clearly appreciated on these images and is obscured by the large amount of residual fecal material throughout the colon. IMPRESSION: *11 mm  calcific density in the right upper quadrant below the right twelfth rib. Not appreciated on the prior CT. *Previously described 1.7 cm stone in the left renal pelvis is not clearly appreciated on these images and is obscured by the large amount of residual fecal material throughout the colon. Electronically Signed   By: Franky Chard M.D.   On: 12/20/2023 13:25       Assessment & Plan:  Hyperglycemia  Anemia, unspecified type  Hypercholesterolemia     Allena Hamilton, MD

## 2024-03-08 ENCOUNTER — Other Ambulatory Visit: Payer: Self-pay | Admitting: Internal Medicine

## 2024-03-08 ENCOUNTER — Encounter: Payer: Self-pay | Admitting: Internal Medicine

## 2024-03-08 NOTE — Assessment & Plan Note (Signed)
 Continues citalopram . Overall appears to be doing relatively well. Follow. No changes in medication today.

## 2024-03-08 NOTE — Assessment & Plan Note (Signed)
 Continue lipitor  ?

## 2024-03-08 NOTE — Assessment & Plan Note (Signed)
 Recently evaluated by endocrinology 10/21/23 - left adrenal adenoma. Has labs scheduled to further evaluate.

## 2024-03-08 NOTE — Assessment & Plan Note (Signed)
 Continue losartan/hydrochlorothiazide and amlodipine. Follow pressures. Follow metabolic panel.

## 2024-03-08 NOTE — Assessment & Plan Note (Signed)
 On crestor . Low cholesterol diet and exercise. Discussed recent labs. Follow lipid panel.  Lab Results  Component Value Date   CHOL 112 03/04/2024   HDL 45.00 03/04/2024   LDLCALC 55 03/04/2024   LDLDIRECT 129.0 03/26/2018   TRIG 61.0 03/04/2024   CHOLHDL 2 03/04/2024

## 2024-03-08 NOTE — Assessment & Plan Note (Signed)
 Saw GI 11/25/23 - evaluate liver cyst. Recommended to repeat ultrasound in one year. Recommended colonoscopy.

## 2024-03-08 NOTE — Assessment & Plan Note (Signed)
S/p stent (carotid) - followed by vascular surgery.  Continue risk factor modification.  

## 2024-03-08 NOTE — Assessment & Plan Note (Signed)
 Low-carb diet and exercise.  Follow met b and A1c.

## 2024-03-08 NOTE — Assessment & Plan Note (Signed)
 Follow cbc.

## 2024-03-08 NOTE — Assessment & Plan Note (Signed)
 Had f/u with [facility] 11/05/23 - f/u carotid stenosis - carotid duplex shows the right carotid stents to be widely patent without recurrent stenosis and the left carotid artery has near normal flow on duplex without any significant disease. Recommended continued annual f/u.

## 2024-03-10 ENCOUNTER — Other Ambulatory Visit: Payer: Self-pay | Admitting: Internal Medicine

## 2024-03-11 DIAGNOSIS — Z01419 Encounter for gynecological examination (general) (routine) without abnormal findings: Secondary | ICD-10-CM | POA: Diagnosis not present

## 2024-03-11 DIAGNOSIS — Z1231 Encounter for screening mammogram for malignant neoplasm of breast: Secondary | ICD-10-CM | POA: Diagnosis not present

## 2024-04-06 DIAGNOSIS — H02831 Dermatochalasis of right upper eyelid: Secondary | ICD-10-CM | POA: Diagnosis not present

## 2024-04-06 DIAGNOSIS — H02834 Dermatochalasis of left upper eyelid: Secondary | ICD-10-CM | POA: Diagnosis not present

## 2024-04-21 ENCOUNTER — Ambulatory Visit (INDEPENDENT_AMBULATORY_CARE_PROVIDER_SITE_OTHER): Admitting: Nurse Practitioner

## 2024-04-21 ENCOUNTER — Telehealth: Payer: Self-pay

## 2024-04-21 ENCOUNTER — Encounter: Payer: Self-pay | Admitting: Nurse Practitioner

## 2024-04-21 VITALS — BP 110/62 | HR 73 | Temp 98.1°F | Resp 20 | Ht 62.0 in | Wt 136.4 lb

## 2024-04-21 DIAGNOSIS — R1013 Epigastric pain: Secondary | ICD-10-CM | POA: Diagnosis not present

## 2024-04-21 DIAGNOSIS — K5900 Constipation, unspecified: Secondary | ICD-10-CM

## 2024-04-21 MED ORDER — FAMOTIDINE 20 MG PO TABS: 20.0000 mg | ORAL_TABLET | Freq: Every day | ORAL | 0 refills | Status: DC

## 2024-04-21 NOTE — Assessment & Plan Note (Signed)
 Intermittent epigastric pain with bloating and tenderness radiating to the back. EKG: Sinus rhythm with no acute ischemic changes. -Will check labs as outlined. -Advised to consume bland diet. -Increase fluid intake, cut down on spicy or fatty food. -Increase water intake.  Will trial famotidine . - Close follow-up.

## 2024-04-21 NOTE — Telephone Encounter (Signed)
 Patient saw Chelsea Aurora, NP, today and per check-out instructions, I scheduled patient for a follow-up appointment with Chelsea Aurora, NP, in one week (04/28/2024 at 3:40pm).  The only available slot was virtual.  Please let us  know if we need to change this to an in-office visit.

## 2024-04-21 NOTE — Assessment & Plan Note (Signed)
 Chronic constipation with infrequent bowel movements, possibly contributing to bloating and discomfort. - Encourage regular bowel movements. - Consider dietary modifications and use MiraLAX.

## 2024-04-21 NOTE — Telephone Encounter (Signed)
 Please change it in office appointment.

## 2024-04-21 NOTE — Progress Notes (Signed)
 Established Patient Office Visit  Subjective:  Patient ID: Kim Love, female    DOB: 26-Jun-1956  Age: 68 y.o. MRN: [id number]  CC:  Chief Complaint  Patient presents with   GI Problem    Started Last Wednesday  when husband had stomach virus. Pain is in the middle of the stomach and  goes to the back, and feeling bloated.    Discussed the use of a AI scribe software for clinical note transcription with the patient, who gave verbal consent to proceed.  HPI Kim Love is a 68 year old female who presents with epigastric pain radiating to the back and bloating.  Epigastric pain began last Wednesday, initially as a grumbling and swollen sensation, intensifying by Thursday and Friday to a burning pain radiating to the back, causing her to double over. The pain eases after belching. She suspects bananas consumed on Wednesday and Thursday may have irritated her stomach. The pain was most severe on Thursday and Friday, with some improvement over the weekend, though bloating and irritation persist. Her husband recently had a stomach bug, but she denies nausea or vomiting.  She has a history of gallbladder issues, but the current pain differs from past gallbladder pain, which was localized to the right side and radiated to the back. She also has a history of kidney stones, with surgery in February.  She has chronic constipation with minimal BM yesterday and today.  She took tizanidine  on Thursday and Friday, which helped with the pain. She has not taken medication specifically for bloating or acid relief.    HPI   Past Medical History:  Diagnosis Date   Anxiety    History of kidney stones    Hypercholesterolemia    Hypertension    Stroke (HCC) 2022   Tobacco abuse     Past Surgical History:  Procedure Laterality Date   CAROTID PTA/STENT INTERVENTION Right 06/12/2021   Procedure: CAROTID PTA/STENT INTERVENTION;  Surgeon: Marea Selinda RAMAN, MD;  Location: [facility];  Service:  Cardiovascular;  Laterality: Right;   CYSTOSCOPY/URETEROSCOPY/HOLMIUM LASER/STENT PLACEMENT Left 10/28/2023   Procedure: CYSTOSCOPY/URETEROSCOPY/HOLMIUM LASER/STENT PLACEMENT;  Surgeon: Penne Knee, MD;  Location: [facility];  Service: Urology;  Laterality: Left;   TONSILLECTOMY AND ADENOIDECTOMY Bilateral 1980    Family History  Problem Relation Age of Onset   Stroke Maternal Grandmother    Stroke Maternal Grandfather     Social History   Socioeconomic History   Marital status: Married    Spouse name: Merchant navy officer   Number of children: 1   Years of education: Not on file   Highest education level: Not on file  Occupational History    Employer: [employer]  Tobacco Use   Smoking status: Every Day    Current packs/day: 1.00    Average packs/day: 1 pack/day for 41.6 years (41.6 ttl pk-yrs)    Types: Cigarettes    Start date: 09/10/1982   Smokeless tobacco: Never  Vaping Use   Vaping status: Never Used  Substance and Sexual Activity   Alcohol use: No    Comment: once in a blue moon: I can't remember the last time.   Drug use: No   Sexual activity: Not on file  Other Topics Concern   Not on file  Social History Narrative   Lives at home with spouse       Right handed   Social Drivers of Health   Financial Resource Strain: Medium Risk (08/12/2023)   Received from  [facility]   Overall Financial Resource Strain (CARDIA)    Difficulty of Paying Living Expenses: Somewhat hard  Food Insecurity: Food Insecurity Present (08/12/2023)   Received from [facility]   Hunger Vital Sign    Within the past 12 months, you worried that your food would run out before you got the money to buy more.: Sometimes true    Within the past 12 months, the food you bought just didn't last and you didn't have money to get more.: Sometimes true  Transportation Needs: No Transportation Needs (08/12/2023)   Received from [facility] -  Transportation    In the past 12 months, has lack of transportation kept you from medical appointments or from getting medications?: No    Lack of Transportation (Non-Medical): No  Physical Activity: Not on file  Stress: Not on file  Social Connections: Not on file  Intimate Partner Violence: Not on file     Outpatient Medications Prior to Visit  Medication Sig Dispense Refill   amLODipine  (NORVASC ) 5 MG tablet TAKE 1 TABLET (5 MG TOTAL) BY MOUTH DAILY. 90 tablet 3   aspirin  EC 81 MG tablet Take 81 mg by mouth at bedtime. Swallow whole.     cholecalciferol  (VITAMIN D3) 25 MCG (1000 UNIT) tablet Take 5,000 Units by mouth every other day.     citalopram  (CELEXA ) 20 MG tablet TAKE 1 TABLET BY MOUTH EVERY DAY 90 tablet 1   clopidogrel  (PLAVIX ) 75 MG tablet TAKE 1 TABLET BY MOUTH EVERY DAY 90 tablet 1   fluticasone  (FLONASE ) 50 MCG/ACT nasal spray Place 2 sprays into both nostrils daily. (Patient taking differently: Place 2 sprays into both nostrils as needed.) 16 g 6   LORazepam  (ATIVAN ) 0.5 MG tablet 1/2 - 1 tablet q day prn 30 tablet 0   losartan -hydrochlorothiazide  (HYZAAR) 100-25 MG tablet TAKE 1 TABLET BY MOUTH EVERY DAY 90 tablet 3   potassium chloride  (KLOR-CON ) 10 MEQ tablet Take 1 tablet (10 mEq total) by mouth daily. 90 tablet 3   rosuvastatin  (CRESTOR ) 40 MG tablet TAKE 1 TABLET BY MOUTH EVERY DAY 90 tablet 3   vitamin B-12 (CYANOCOBALAMIN ) 1000 MCG tablet Take 1,000 mcg by mouth daily.     No facility-administered medications prior to visit.    Allergies  Allergen Reactions   Povidone-Iodine Swelling   Sulfamethoxazole-Trimethoprim Nausea And Vomiting   Atenolol Other (See Comments)    Bradycardia   Ciprofloxacin     Other Reaction(s): keeped her awake   Codeine     REACTION: gallbladder issues   Erythromycin     REACTION: nausea   Erythromycin     Upset stomach   Olmesartan     Other reaction(s): Other (See Comments) Increased BP.   Povidone Iodine Swelling    Prednisone     Other Reaction(s): raise patient blood pressure   Sulfa Antibiotics    Codeine     Gallbladder issues    ROS Review of Systems Negative unless indicated in HPI.    Objective:    Physical Exam Constitutional:      Appearance: Normal appearance.  Cardiovascular:     Rate and Rhythm: Normal rate and regular rhythm.     Pulses: Normal pulses.     Heart sounds: Normal heart sounds.  Abdominal:     General: Bowel sounds are normal.     Palpations: Abdomen is soft.     Tenderness: There is abdominal tenderness (epigastric). There  is no right CVA tenderness or left CVA tenderness.  Musculoskeletal:     Cervical back: Normal range of motion.       Back:     Comments: Tenderness along the lower thoracic region midline  Neurological:     General: No focal deficit present.     Mental Status: She is alert. Mental status is at baseline.  Psychiatric:        Mood and Affect: Mood normal.        Behavior: Behavior normal.        Thought Content: Thought content normal.        Judgment: Judgment normal.     BP 110/62   Pulse 73   Temp 98.1 F (36.7 C)   Resp 20   Ht 5' 2 (1.575 m)   Wt 136 lb 6 oz (61.9 kg)   SpO2 97%   BMI 24.94 kg/m  Wt Readings from Last 3 Encounters:  04/21/24 136 lb 6 oz (61.9 kg)  03/06/24 135 lb 9.6 oz (61.5 kg)  12/18/23 135 lb 8 oz (61.5 kg)     Health Maintenance  Topic Date Due   Medicare Annual Wellness (AWV)  Never done   Hepatitis C Screening  Never done   Lung Cancer Screening  Never done   COVID-19 Vaccine (1 - 2024-25 season) 05/06/2024 (Originally 05/12/2023)   INFLUENZA VACCINE  12/08/2024 (Originally 04/10/2024)   Pneumococcal Vaccine: 50+ Years (1 of 2 - PCV) 03/06/2025 (Originally 05/21/1976)   MAMMOGRAM  03/06/2025 (Originally 02/28/2024)   Colonoscopy  12/22/2033   DEXA SCAN  Completed   Zoster Vaccines- Shingrix   Completed   Hepatitis B Vaccines  Aged Out   HPV VACCINES  Aged Out   Meningococcal B Vaccine  Aged  Out   DTaP/Tdap/Td  Discontinued    There are no preventive care reminders to display for this patient.  Lab Results  Component Value Date   TSH 1.12 11/06/2023   Lab Results  Component Value Date   WBC 7.2 11/06/2023   HGB 13.5 11/06/2023   HCT 40.9 11/06/2023   MCV 92.6 11/06/2023   PLT 309.0 11/06/2023   Lab Results  Component Value Date   NA 138 03/04/2024   K 3.5 03/04/2024   CO2 31 03/04/2024   GLUCOSE 91 03/04/2024   BUN 8 03/04/2024   CREATININE 0.86 03/04/2024   BILITOT 0.4 03/04/2024   ALKPHOS 70 03/04/2024   AST 16 03/04/2024   ALT 13 03/04/2024   PROT 6.9 03/04/2024   ALBUMIN 4.1 03/04/2024   CALCIUM  9.1 03/04/2024   ANIONGAP 3 (L) 06/13/2021   GFR 70.22 03/04/2024   Lab Results  Component Value Date   CHOL 112 03/04/2024   Lab Results  Component Value Date   HDL 45.00 03/04/2024   Lab Results  Component Value Date   LDLCALC 55 03/04/2024   Lab Results  Component Value Date   TRIG 61.0 03/04/2024   Lab Results  Component Value Date   CHOLHDL 2 03/04/2024   Lab Results  Component Value Date   HGBA1C 5.8 03/04/2024      Assessment & Plan:  Epigastric pain Assessment & Plan: Intermittent epigastric pain with bloating and tenderness radiating to the back. EKG: Sinus rhythm with no acute ischemic changes. -Will check labs as outlined. -Advised to consume bland diet. -Increase fluid intake, cut down on spicy or fatty food. -Increase water intake.  Will trial famotidine . - Close follow-up.   Orders: -  Hepatic function panel -     Basic metabolic panel with GFR -     EKG 12-Lead -     Lipase  Constipation, unspecified constipation type Assessment & Plan: Chronic constipation with infrequent bowel movements, possibly contributing to bloating and discomfort. - Encourage regular bowel movements. - Consider dietary modifications and use MiraLAX.   Other orders -     Famotidine ; Take 1 tablet (20 mg total) by mouth daily.   Dispense: 30 tablet; Refill: 0    Follow-up: Return in about 1 week (around 04/28/2024).   Zymiere Trostle, NP

## 2024-04-22 LAB — BASIC METABOLIC PANEL WITH GFR
BUN: 17 mg/dL (ref 6–23)
CO2: 29 meq/L (ref 19–32)
Calcium: 8.8 mg/dL (ref 8.4–10.5)
Chloride: 101 meq/L (ref 96–112)
Creatinine, Ser: 0.85 mg/dL (ref 0.40–1.20)
GFR: 71.14 mL/min (ref >60.00)
Glucose, Bld: 96 mg/dL (ref 70–99)
Potassium: 3.5 meq/L (ref 3.5–5.1)
Sodium: 139 meq/L (ref 135–145)

## 2024-04-22 LAB — HEPATIC FUNCTION PANEL
ALT: 12 U/L (ref 0–35)
AST: 14 U/L (ref 0–37)
Albumin: 4 g/dL (ref 3.5–5.2)
Alkaline Phosphatase: 65 U/L (ref 39–117)
Bilirubin, Direct: 0.1 mg/dL (ref 0.0–0.3)
Total Bilirubin: 0.4 mg/dL (ref 0.2–1.2)
Total Protein: 6.3 g/dL (ref 6.0–8.3)

## 2024-04-22 LAB — LIPASE: Lipase: 44 U/L (ref 11.0–59.0)

## 2024-04-28 ENCOUNTER — Ambulatory Visit: Admitting: Nurse Practitioner

## 2024-05-13 ENCOUNTER — Ambulatory Visit: Payer: Self-pay | Admitting: Nurse Practitioner

## 2024-05-19 ENCOUNTER — Other Ambulatory Visit: Payer: Self-pay | Admitting: Nurse Practitioner

## 2024-06-12 ENCOUNTER — Other Ambulatory Visit: Payer: Self-pay | Admitting: Internal Medicine

## 2024-07-02 ENCOUNTER — Ambulatory Visit: Payer: Self-pay | Admitting: Internal Medicine

## 2024-07-02 ENCOUNTER — Other Ambulatory Visit (INDEPENDENT_AMBULATORY_CARE_PROVIDER_SITE_OTHER)

## 2024-07-02 DIAGNOSIS — E7800 Pure hypercholesterolemia, unspecified: Secondary | ICD-10-CM | POA: Diagnosis not present

## 2024-07-02 DIAGNOSIS — R739 Hyperglycemia, unspecified: Secondary | ICD-10-CM | POA: Diagnosis not present

## 2024-07-02 DIAGNOSIS — D649 Anemia, unspecified: Secondary | ICD-10-CM

## 2024-07-02 LAB — HEPATIC FUNCTION PANEL
ALT: 15 U/L (ref 0–35)
AST: 15 U/L (ref 0–37)
Albumin: 4.2 g/dL (ref 3.5–5.2)
Alkaline Phosphatase: 70 U/L (ref 39–117)
Bilirubin, Direct: 0.1 mg/dL (ref 0.0–0.3)
Total Bilirubin: 0.5 mg/dL (ref 0.2–1.2)
Total Protein: 6.9 g/dL (ref 6.0–8.3)

## 2024-07-02 LAB — BASIC METABOLIC PANEL WITH GFR
BUN: 12 mg/dL (ref 6–23)
CO2: 33 meq/L — ABNORMAL HIGH (ref 19–32)
Calcium: 9 mg/dL (ref 8.4–10.5)
Chloride: 100 meq/L (ref 96–112)
Creatinine, Ser: 0.86 mg/dL (ref 0.40–1.20)
GFR: 70.05 mL/min
Glucose, Bld: 94 mg/dL (ref 70–99)
Potassium: 3.8 meq/L (ref 3.5–5.1)
Sodium: 141 meq/L (ref 135–145)

## 2024-07-02 LAB — LIPID PANEL
Cholesterol: 124 mg/dL (ref 0–200)
HDL: 43.8 mg/dL
LDL Cholesterol: 62 mg/dL (ref 0–99)
NonHDL: 80.67
Total CHOL/HDL Ratio: 3
Triglycerides: 92 mg/dL (ref 0.0–149.0)
VLDL: 18.4 mg/dL (ref 0.0–40.0)

## 2024-07-02 LAB — HEMOGLOBIN A1C: Hgb A1c MFr Bld: 5.8 % (ref 4.6–6.5)

## 2024-07-08 ENCOUNTER — Ambulatory Visit: Admitting: Internal Medicine

## 2024-07-08 ENCOUNTER — Encounter: Payer: Self-pay | Admitting: Internal Medicine

## 2024-07-08 VITALS — BP 122/74 | HR 65 | Temp 97.9°F | Ht 62.0 in | Wt 138.0 lb

## 2024-07-08 DIAGNOSIS — R739 Hyperglycemia, unspecified: Secondary | ICD-10-CM

## 2024-07-08 DIAGNOSIS — D649 Anemia, unspecified: Secondary | ICD-10-CM

## 2024-07-08 DIAGNOSIS — D3500 Benign neoplasm of unspecified adrenal gland: Secondary | ICD-10-CM

## 2024-07-08 DIAGNOSIS — K7689 Other specified diseases of liver: Secondary | ICD-10-CM

## 2024-07-08 DIAGNOSIS — N200 Calculus of kidney: Secondary | ICD-10-CM

## 2024-07-08 DIAGNOSIS — Z720 Tobacco use: Secondary | ICD-10-CM

## 2024-07-08 DIAGNOSIS — I10 Essential (primary) hypertension: Secondary | ICD-10-CM

## 2024-07-08 DIAGNOSIS — E7800 Pure hypercholesterolemia, unspecified: Secondary | ICD-10-CM

## 2024-07-08 DIAGNOSIS — I739 Peripheral vascular disease, unspecified: Secondary | ICD-10-CM

## 2024-07-08 DIAGNOSIS — R928 Other abnormal and inconclusive findings on diagnostic imaging of breast: Secondary | ICD-10-CM

## 2024-07-08 NOTE — Progress Notes (Signed)
 Subjective:    Patient ID: Kim Love, female    DOB: 12-Sep-1955, 68 y.o.   MRN: [id number]  Patient here for  Chief Complaint  Patient presents with   Medical Management of Chronic Issues    HPI Here for a scheduled follow up - follow up regarding hypertension and hypercholesterolemia. Had f/u with [facility] 11/05/23 - f/u carotid stenosis - carotid duplex shows the right carotid stents to be widely patent without recurrent stenosis and the left carotid artery has near normal flow on duplex without any significant disease. Recommended continued annual f/u. Recently evaluated by endocrinology 10/21/23 - left adrenal adenoma. Has labs scheduled to further evaluate. Seeing Dr Penne. Recent CT - 1.7 cm left renal stone and left hydronephrosis. S/p left cystoscopy/ureteroscopy/stent placement 10/28/23. Had f/u with urology 12/18/23 - ultrasound - some residual stone dust, but no significant residual stone burden. Recommended general stone prevention techniques and f/u with one year for KUB. Saw GI 11/25/23 - evaluate liver cyst. Recommended to repeat ultrasound in one year. Overall doing relatively well. Appears to be handling stress. No chest pain or sob reported. No abdominal pain or bowel change reported.    Past Medical History:  Diagnosis Date   Anxiety    History of kidney stones    Hypercholesterolemia    Hypertension    Stroke (HCC) 2022   Tobacco abuse    Past Surgical History:  Procedure Laterality Date   CAROTID PTA/STENT INTERVENTION Right 06/12/2021   Procedure: CAROTID PTA/STENT INTERVENTION;  Surgeon: Marea Selinda RAMAN, MD;  Location: [facility];  Service: Cardiovascular;  Laterality: Right;   CYSTOSCOPY/URETEROSCOPY/HOLMIUM LASER/STENT PLACEMENT Left 10/28/2023   Procedure: CYSTOSCOPY/URETEROSCOPY/HOLMIUM LASER/STENT PLACEMENT;  Surgeon: Penne Knee, MD;  Location: [facility];  Service: Urology;  Laterality: Left;   TONSILLECTOMY AND ADENOIDECTOMY Bilateral 1980   Family  History  Problem Relation Age of Onset   Stroke Maternal Grandmother    Stroke Maternal Grandfather    Social History   Socioeconomic History   Marital status: Married    Spouse name: Merchant Navy Officer   Number of children: 1   Years of education: Not on file   Highest education level: Not on file  Occupational History    Employer: [employer]  Tobacco Use   Smoking status: Every Day    Current packs/day: 1.00    Average packs/day: 1 pack/day for 41.9 years (41.9 ttl pk-yrs)    Types: Cigarettes    Start date: 09/10/1982   Smokeless tobacco: Never  Vaping Use   Vaping status: Never Used  Substance and Sexual Activity   Alcohol use: No    Comment: once in a blue moon: I can't remember the last time.   Drug use: No   Sexual activity: Not on file  Other Topics Concern   Not on file  Social History Narrative   Lives at home with spouse       Right handed   Social Drivers of Health   Financial Resource Strain: Medium Risk (08/12/2023)   Received from [facility]   Overall Financial Resource Strain (CARDIA)    Difficulty of Paying Living Expenses: Somewhat hard  Food Insecurity: Food Insecurity Present (08/12/2023)   Received from [facility]   Hunger Vital Sign    Within the past 12 months, you worried that your food would run out before you got the money to buy more.: Sometimes true    Within the past 12 months, the food  you bought just didn't last and you didn't have money to get more.: Sometimes true  Transportation Needs: No Transportation Needs (08/12/2023)   Received from [facility]    In the past 12 months, has lack of transportation kept you from medical appointments or from getting medications?: No    Lack of Transportation (Non-Medical): No  Physical Activity: Not on file  Stress: Not on file  Social Connections: Not on file     Review of Systems  Constitutional:  Negative for  appetite change and unexpected weight change.  HENT:  Negative for congestion and sinus pressure.   Respiratory:  Negative for cough, chest tightness and shortness of breath.   Cardiovascular:  Negative for chest pain, palpitations and leg swelling.  Gastrointestinal:  Negative for abdominal pain, diarrhea, nausea and vomiting.  Genitourinary:  Negative for difficulty urinating and dysuria.  Musculoskeletal:  Negative for joint swelling and myalgias.  Skin:  Negative for color change and rash.  Neurological:  Negative for dizziness and headaches.  Psychiatric/Behavioral:  Negative for agitation and dysphoric mood.        Objective:     BP 122/74   Pulse 65   Temp 97.9 F (36.6 C) (Oral)   Ht 5' 2 (1.575 m)   Wt 138 lb (62.6 kg)   SpO2 94%   BMI 25.24 kg/m  Wt Readings from Last 3 Encounters:  07/08/24 138 lb (62.6 kg)  04/21/24 136 lb 6 oz (61.9 kg)  03/06/24 135 lb 9.6 oz (61.5 kg)    Physical Exam Vitals reviewed.  Constitutional:      General: She is not in acute distress.    Appearance: Normal appearance.  HENT:     Head: Normocephalic and atraumatic.     Right Ear: External ear normal.     Left Ear: External ear normal.     Mouth/Throat:     Pharynx: No oropharyngeal exudate or posterior oropharyngeal erythema.  Eyes:     General: No scleral icterus.       Right eye: No discharge.        Left eye: No discharge.     Conjunctiva/sclera: Conjunctivae normal.  Neck:     Thyroid : No thyromegaly.  Cardiovascular:     Rate and Rhythm: Normal rate and regular rhythm.  Pulmonary:     Effort: No respiratory distress.     Breath sounds: Normal breath sounds. No wheezing.  Abdominal:     General: Bowel sounds are normal.     Palpations: Abdomen is soft.     Tenderness: There is no abdominal tenderness.  Musculoskeletal:        General: No swelling or tenderness.     Cervical back: Neck supple. No tenderness.  Lymphadenopathy:     Cervical: No cervical  adenopathy.  Skin:    Findings: No erythema or rash.  Neurological:     Mental Status: She is alert.  Psychiatric:        Mood and Affect: Mood normal.        Behavior: Behavior normal.         Outpatient Encounter Medications as of 07/08/2024  Medication Sig   amLODipine  (NORVASC ) 5 MG tablet TAKE 1 TABLET (5 MG TOTAL) BY MOUTH DAILY.   aspirin  EC 81 MG tablet Take 81 mg by mouth at bedtime. Swallow whole.   cholecalciferol  (VITAMIN D3) 25 MCG (1000 UNIT) tablet Take 5,000 Units by mouth every other day.  clopidogrel  (PLAVIX ) 75 MG tablet TAKE 1 TABLET BY MOUTH EVERY DAY   fluticasone  (FLONASE ) 50 MCG/ACT nasal spray Place 2 sprays into both nostrils daily. (Patient taking differently: Place 2 sprays into both nostrils as needed.)   LORazepam  (ATIVAN ) 0.5 MG tablet 1/2 - 1 tablet q day prn   losartan -hydrochlorothiazide  (HYZAAR) 100-25 MG tablet TAKE 1 TABLET BY MOUTH EVERY DAY   potassium chloride  (KLOR-CON ) 10 MEQ tablet TAKE 1 TABLET BY MOUTH EVERY DAY   rosuvastatin  (CRESTOR ) 40 MG tablet TAKE 1 TABLET BY MOUTH EVERY DAY   vitamin B-12 (CYANOCOBALAMIN ) 1000 MCG tablet Take 1,000 mcg by mouth daily.   citalopram  (CELEXA ) 20 MG tablet Take 1 tablet (20 mg total) by mouth daily.   famotidine  (PEPCID ) 20 MG tablet Take 1 tablet (20 mg total) by mouth daily.   [DISCONTINUED] citalopram  (CELEXA ) 20 MG tablet TAKE 1 TABLET BY MOUTH EVERY DAY   [DISCONTINUED] famotidine  (PEPCID ) 20 MG tablet TAKE 1 TABLET BY MOUTH EVERY DAY   No facility-administered encounter medications on file as of 07/08/2024.     Lab Results  Component Value Date   WBC 7.2 11/06/2023   HGB 13.5 11/06/2023   HCT 40.9 11/06/2023   PLT 309.0 11/06/2023   GLUCOSE 94 07/02/2024   CHOL 124 07/02/2024   TRIG 92.0 07/02/2024   HDL 43.80 07/02/2024   LDLDIRECT 129.0 03/26/2018   LDLCALC 62 07/02/2024   ALT 15 07/02/2024   AST 15 07/02/2024   NA 141 07/02/2024   K 3.8 07/02/2024   CL 100 07/02/2024    CREATININE 0.86 07/02/2024   BUN 12 07/02/2024   CO2 33 (H) 07/02/2024   TSH 1.12 11/06/2023   INR 1.0 06/01/2021   HGBA1C 5.8 07/02/2024    Abdomen 1 view (KUB) Result Date: 12/20/2023 CLINICAL DATA:  Renal ultrasound November 06, 2023 CT abdomen September 10, 2023 EXAM: ABDOMEN - 1 VIEW COMPARISON:  CT abdomen September 10, 2023 FINDINGS: 11 mm calcific density is noted in the right upper quadrant below the right twelfth rib. Not appreciated on the prior CT. Previously described 1.7 cm stone in the left renal pelvis is not clearly appreciated on these images and is obscured by the large amount of residual fecal material throughout the colon. IMPRESSION: *11 mm calcific density in the right upper quadrant below the right twelfth rib. Not appreciated on the prior CT. *Previously described 1.7 cm stone in the left renal pelvis is not clearly appreciated on these images and is obscured by the large amount of residual fecal material throughout the colon. Electronically Signed   By: Franky Chard M.D.   On: 12/20/2023 13:25       Assessment & Plan:  Tobacco abuse Assessment & Plan: Have discussed. Needs to quit smoking. Consider CT lung screening.    Anemia, unspecified type -     Basic metabolic panel with GFR; Future -     CBC with Differential/Platelet; Future  Hypercholesterolemia Assessment & Plan: On crestor . Low cholesterol diet and exercise. Follow lipid panel.  Lab Results  Component Value Date   CHOL 124 07/02/2024   HDL 43.80 07/02/2024   LDLCALC 62 07/02/2024   LDLDIRECT 129.0 03/26/2018   TRIG 92.0 07/02/2024   CHOLHDL 3 07/02/2024     Orders: -     Hepatic function panel; Future -     Lipid panel; Future  Abnormal mammogram  PAD (peripheral artery disease) Assessment & Plan: S/p stent (carotid) - followed by vascular surgery. Continue risk  factor modification. Had f/u with [facility] 11/05/23 - f/u carotid stenosis - carotid duplex shows the right carotid stents to be  widely patent without recurrent stenosis and the left carotid artery has near normal flow on duplex without any significant disease. Recommended continued annual f/u.   Nephrolithiasis Assessment & Plan: Saw Dr Penne. Recent CT - 1.7 cm left renal stone and left hydronephrosis. S/p left cystoscopy/ureteroscopy/stent placement 10/28/23. Had f/u with urology 12/18/23 - ultrasound - some residual stone dust, but no significant residual stone burden. Recommended general stone prevention techniques and f/u with one year for KUB.    Liver cyst Assessment & Plan:  Saw GI 11/25/23 - evaluate liver cyst. Recommended to repeat ultrasound in one year.   Hyperglycemia Assessment & Plan: Low carb diet and exercise. Follow met b and A1c.    Essential hypertension, benign Assessment & Plan: Continue losartan /hydrochlorothiazide  and amlodipine . Follow pressures.  Follow metabolic panel.    Adrenal adenoma, unspecified laterality Assessment & Plan: Recently evaluated by endocrinology 10/21/23 - left adrenal adenoma. Continue f/u with endocrinology.    Other orders -     Citalopram  Hydrobromide; Take 1 tablet (20 mg total) by mouth daily.  Dispense: 90 tablet; Refill: 1 -     Famotidine ; Take 1 tablet (20 mg total) by mouth daily.  Dispense: 90 tablet; Refill: 1     Allena Hamilton, MD

## 2024-07-13 DIAGNOSIS — K080 Exfoliation of teeth due to systemic causes: Secondary | ICD-10-CM | POA: Diagnosis not present

## 2024-07-19 ENCOUNTER — Encounter: Payer: Self-pay | Admitting: Internal Medicine

## 2024-07-19 MED ORDER — CITALOPRAM HYDROBROMIDE 20 MG PO TABS: 20.0000 mg | ORAL_TABLET | Freq: Every day | ORAL | 1 refills | Status: AC

## 2024-07-19 MED ORDER — FAMOTIDINE 20 MG PO TABS: 20.0000 mg | ORAL_TABLET | Freq: Every day | ORAL | 1 refills | Status: AC

## 2024-07-19 NOTE — Assessment & Plan Note (Signed)
 Recently evaluated by endocrinology 10/21/23 - left adrenal adenoma. Continue f/u with endocrinology.

## 2024-07-19 NOTE — Assessment & Plan Note (Signed)
 On crestor . Low cholesterol diet and exercise. Follow lipid panel.  Lab Results  Component Value Date   CHOL 124 07/02/2024   HDL 43.80 07/02/2024   LDLCALC 62 07/02/2024   LDLDIRECT 129.0 03/26/2018   TRIG 92.0 07/02/2024   CHOLHDL 3 07/02/2024

## 2024-07-19 NOTE — Assessment & Plan Note (Signed)
 Have discussed. Needs to quit smoking. Consider CT lung screening.

## 2024-07-19 NOTE — Assessment & Plan Note (Signed)
 S/p stent (carotid) - followed by vascular surgery. Continue risk factor modification. Had f/u with [facility] 11/05/23 - f/u carotid stenosis - carotid duplex shows the right carotid stents to be widely patent without recurrent stenosis and the left carotid artery has near normal flow on duplex without any significant disease. Recommended continued annual f/u.

## 2024-07-19 NOTE — Assessment & Plan Note (Signed)
 Saw GI 11/25/23 - evaluate liver cyst. Recommended to repeat ultrasound in one year.

## 2024-07-19 NOTE — Assessment & Plan Note (Signed)
 Saw Dr Penne. Recent CT - 1.7 cm left renal stone and left hydronephrosis. S/p left cystoscopy/ureteroscopy/stent placement 10/28/23. Had f/u with urology 12/18/23 - ultrasound - some residual stone dust, but no significant residual stone burden. Recommended general stone prevention techniques and f/u with one year for KUB.

## 2024-07-19 NOTE — Assessment & Plan Note (Signed)
 Continue losartan/hydrochlorothiazide and amlodipine. Follow pressures. Follow metabolic panel.

## 2024-07-19 NOTE — Assessment & Plan Note (Signed)
 Low-carb diet and exercise.  Follow met b and A1c.

## 2024-07-28 DIAGNOSIS — K080 Exfoliation of teeth due to systemic causes: Secondary | ICD-10-CM | POA: Diagnosis not present

## 2024-08-18 DIAGNOSIS — K080 Exfoliation of teeth due to systemic causes: Secondary | ICD-10-CM | POA: Diagnosis not present

## 2024-08-19 ENCOUNTER — Encounter: Payer: Self-pay | Admitting: Internal Medicine

## 2024-08-21 NOTE — Telephone Encounter (Signed)
 This is in Kim Love's chart. Please call and confirm he is doing ok. See if he can come in for work in appt 12:30 - 08/26/24.

## 2024-08-21 NOTE — Telephone Encounter (Signed)
 Message moved to spouse's chart.

## 2024-08-23 ENCOUNTER — Other Ambulatory Visit (INDEPENDENT_AMBULATORY_CARE_PROVIDER_SITE_OTHER): Payer: Self-pay | Admitting: Nurse Practitioner

## 2024-11-03 ENCOUNTER — Encounter (INDEPENDENT_AMBULATORY_CARE_PROVIDER_SITE_OTHER): Payer: Medicare Other

## 2024-11-03 ENCOUNTER — Ambulatory Visit (INDEPENDENT_AMBULATORY_CARE_PROVIDER_SITE_OTHER): Payer: Medicare Other | Admitting: Vascular Surgery

## 2024-11-11 ENCOUNTER — Other Ambulatory Visit

## 2024-11-13 ENCOUNTER — Encounter: Admitting: Internal Medicine

## 2024-12-23 ENCOUNTER — Ambulatory Visit: Admitting: Physician Assistant
